# Patient Record
Sex: Female | Born: 1945 | Race: White | Hispanic: No | Marital: Single | Smoking: Former smoker
Health system: Southern US, Community
[De-identification: ages and names within clinical notes are randomized; demographics above are authoritative.]

## PROBLEM LIST (undated history)

## (undated) DIAGNOSIS — R0600 Dyspnea, unspecified: Secondary | ICD-10-CM

## (undated) DIAGNOSIS — H409 Unspecified glaucoma: Secondary | ICD-10-CM

## (undated) DIAGNOSIS — T8859XA Other complications of anesthesia, initial encounter: Secondary | ICD-10-CM

## (undated) DIAGNOSIS — N183 Chronic kidney disease, stage 3 unspecified: Secondary | ICD-10-CM

## (undated) DIAGNOSIS — T4145XA Adverse effect of unspecified anesthetic, initial encounter: Secondary | ICD-10-CM

## (undated) DIAGNOSIS — F32A Depression, unspecified: Secondary | ICD-10-CM

## (undated) DIAGNOSIS — I272 Pulmonary hypertension, unspecified: Secondary | ICD-10-CM

## (undated) DIAGNOSIS — K5900 Constipation, unspecified: Secondary | ICD-10-CM

## (undated) DIAGNOSIS — M1990 Unspecified osteoarthritis, unspecified site: Secondary | ICD-10-CM

## (undated) DIAGNOSIS — E039 Hypothyroidism, unspecified: Secondary | ICD-10-CM

## (undated) DIAGNOSIS — F329 Major depressive disorder, single episode, unspecified: Secondary | ICD-10-CM

## (undated) DIAGNOSIS — I059 Rheumatic mitral valve disease, unspecified: Secondary | ICD-10-CM

## (undated) DIAGNOSIS — K219 Gastro-esophageal reflux disease without esophagitis: Secondary | ICD-10-CM

## (undated) DIAGNOSIS — I350 Nonrheumatic aortic (valve) stenosis: Secondary | ICD-10-CM

## (undated) DIAGNOSIS — I10 Essential (primary) hypertension: Secondary | ICD-10-CM

## (undated) DIAGNOSIS — E785 Hyperlipidemia, unspecified: Secondary | ICD-10-CM

## (undated) HISTORY — PX: COLONOSCOPY: SHX174

## (undated) HISTORY — DX: Hyperlipidemia, unspecified: E78.5

## (undated) HISTORY — DX: Gastro-esophageal reflux disease without esophagitis: K21.9

## (undated) HISTORY — PX: CARPAL TUNNEL RELEASE: SHX101

## (undated) HISTORY — DX: Depression, unspecified: F32.A

## (undated) HISTORY — DX: Pulmonary hypertension, unspecified: I27.20

## (undated) HISTORY — DX: Chronic kidney disease, stage 3 unspecified: N18.30

## (undated) HISTORY — DX: Chronic kidney disease, stage 3 (moderate): N18.3

## (undated) HISTORY — PX: TOE SURGERY: SHX1073

## (undated) HISTORY — DX: Essential (primary) hypertension: I10

## (undated) HISTORY — DX: Major depressive disorder, single episode, unspecified: F32.9

## (undated) HISTORY — DX: Unspecified glaucoma: H40.9

## (undated) HISTORY — DX: Nonrheumatic aortic (valve) stenosis: I35.0

## (undated) HISTORY — DX: Rheumatic mitral valve disease, unspecified: I05.9

## (undated) HISTORY — DX: Hypothyroidism, unspecified: E03.9

## (undated) HISTORY — PX: CATARACT EXTRACTION: SUR2

## (undated) HISTORY — PX: BUNIONECTOMY: SHX129

## (undated) HISTORY — PX: TRIGGER FINGER RELEASE: SHX641

---

## 1980-12-17 HISTORY — PX: CARPAL TUNNEL RELEASE: SHX101

## 2005-04-09 ENCOUNTER — Other Ambulatory Visit: Admission: RE | Admit: 2005-04-09 | Discharge: 2005-04-09 | Payer: Self-pay | Admitting: Family Medicine

## 2005-12-04 ENCOUNTER — Encounter: Admission: RE | Admit: 2005-12-04 | Discharge: 2005-12-04 | Payer: Self-pay | Admitting: Internal Medicine

## 2006-02-07 ENCOUNTER — Ambulatory Visit (HOSPITAL_COMMUNITY): Admission: RE | Admit: 2006-02-07 | Discharge: 2006-02-07 | Payer: Self-pay | Admitting: Cardiology

## 2006-02-19 ENCOUNTER — Ambulatory Visit (HOSPITAL_COMMUNITY): Admission: RE | Admit: 2006-02-19 | Discharge: 2006-02-19 | Payer: Self-pay | Admitting: Cardiology

## 2006-12-20 ENCOUNTER — Other Ambulatory Visit: Admission: RE | Admit: 2006-12-20 | Discharge: 2006-12-20 | Payer: Self-pay | Admitting: Obstetrics and Gynecology

## 2006-12-23 ENCOUNTER — Encounter: Admission: RE | Admit: 2006-12-23 | Discharge: 2006-12-23 | Payer: Self-pay | Admitting: Obstetrics and Gynecology

## 2006-12-25 ENCOUNTER — Encounter: Admission: RE | Admit: 2006-12-25 | Discharge: 2006-12-25 | Payer: Self-pay | Admitting: Obstetrics and Gynecology

## 2007-07-01 ENCOUNTER — Encounter: Admission: RE | Admit: 2007-07-01 | Discharge: 2007-07-01 | Payer: Self-pay | Admitting: Obstetrics and Gynecology

## 2007-09-29 ENCOUNTER — Emergency Department (HOSPITAL_COMMUNITY): Admission: EM | Admit: 2007-09-29 | Discharge: 2007-09-29 | Payer: Self-pay | Admitting: Emergency Medicine

## 2008-01-23 ENCOUNTER — Encounter: Admission: RE | Admit: 2008-01-23 | Discharge: 2008-01-23 | Payer: Self-pay | Admitting: Obstetrics and Gynecology

## 2008-05-03 ENCOUNTER — Other Ambulatory Visit: Admission: RE | Admit: 2008-05-03 | Discharge: 2008-05-03 | Payer: Self-pay | Admitting: Obstetrics and Gynecology

## 2008-05-20 ENCOUNTER — Encounter: Admission: RE | Admit: 2008-05-20 | Discharge: 2008-05-20 | Payer: Self-pay | Admitting: Cardiology

## 2008-05-27 ENCOUNTER — Ambulatory Visit (HOSPITAL_COMMUNITY): Admission: RE | Admit: 2008-05-27 | Discharge: 2008-05-27 | Payer: Self-pay | Admitting: Cardiology

## 2008-06-09 ENCOUNTER — Inpatient Hospital Stay (HOSPITAL_BASED_OUTPATIENT_CLINIC_OR_DEPARTMENT_OTHER): Admission: RE | Admit: 2008-06-09 | Discharge: 2008-06-09 | Payer: Self-pay | Admitting: Cardiology

## 2009-06-07 ENCOUNTER — Other Ambulatory Visit: Admission: RE | Admit: 2009-06-07 | Discharge: 2009-06-07 | Payer: Self-pay | Admitting: Obstetrics and Gynecology

## 2009-08-09 ENCOUNTER — Encounter: Admission: RE | Admit: 2009-08-09 | Discharge: 2009-08-09 | Payer: Self-pay | Admitting: Obstetrics and Gynecology

## 2009-11-04 ENCOUNTER — Inpatient Hospital Stay (HOSPITAL_BASED_OUTPATIENT_CLINIC_OR_DEPARTMENT_OTHER): Admission: RE | Admit: 2009-11-04 | Discharge: 2009-11-04 | Payer: Self-pay | Admitting: Cardiology

## 2009-11-04 HISTORY — PX: CARDIAC CATHETERIZATION: SHX172

## 2010-07-04 ENCOUNTER — Other Ambulatory Visit: Admission: RE | Admit: 2010-07-04 | Discharge: 2010-07-04 | Payer: Self-pay | Admitting: Obstetrics and Gynecology

## 2011-01-07 ENCOUNTER — Encounter: Payer: Self-pay | Admitting: Obstetrics and Gynecology

## 2011-05-01 NOTE — Cardiovascular Report (Signed)
NAME:  Maureen Keller, Maureen Keller                ACCOUNT NO.:  [id number]   MEDICAL RECORD NO.:  [id number]          PATIENT TYPE:  OIB   LOCATION:  1961                         FACILITY:  [facility]   PHYSICIAN:  Armanda Magic, M.D.     DATE OF BIRTH:  1946/08/06   DATE OF PROCEDURE:  DATE OF DISCHARGE:  06/09/2008                            CARDIAC CATHETERIZATION   REFERRING PHYSICIAN:  Dr. Lavonda Jumbo, M.D.   PROCEDURES:  1. Right and left heart catheterization.  2. Coronary angiography.  3. Left ventriculography.   OPERATOR:  Trace Turner, MD.   INDICATIONS:  Pulmonary hypertension by 2-D echocardiogram with mild  aortic stenosis by 2-D echocardiogram as well.  The patient has been  having shortness of breath and now presents for heart catheterization.   The patient was brought to the Cardiac Catheterization Laboratory in a  fasting nonsedated state.  Informed consent was obtained.  The patient  was connected to continuous heart rate and pulse oximetry monitoring and  blood pressure monitoring.  The right groin was prepped and draped in  sterile fashion.  1% Xylocaine was used for local anesthesia.  Using a  modified Seldinger technique, a 4-French sheath was attempted to be  placed in the right femoral artery, but there was difficulty in  inserting the sheath.  The sheath was taken out.  The dilator was placed  over the wire to dilate up a tract which went very smoothly but then  when the dilator was reattached to the sheath, the sheath could not be  advanced over the wire into the femoral artery.  The sheath was  exchanged out for a 5-French sheath and the same thing occurred.  The  sheath was removed, and a 5-French dilator was placed over the wire  again trying to dilate a tract through the skin into the artery.  The  sheath was then reattached to the dilator and placed back over the wire  again with difficulty in advancing the sheath.  The sheath was removed  and attempts at a 4-French  sheath again were successful.  Then using a  modified Seldinger technique, a 7-French sheath was placed in the right  femoral vein.  Under fluoroscopic guidance, a Swan-Ganz catheter was  placed under balloon flotation into the right atrium.  Right atrial  pressure was measured.  Right atrial O2 saturations were obtained.  Aortic O2 saturations were also obtained.  The catheter was then  advanced into the right ventricle, and right ventricular pressure was  measured.  The catheter was then advanced into the pulmonary artery into  the wedge position.  Pulmonary capillary wedge pressure was measured,  and balloon was deflated and pulled back into the pulmonary artery.  Pulmonary artery O2 saturations were obtained.  Thermal dilutions were  then obtained using 10 mL of contrast for injections.  The Swan-Ganz  catheter then was removed under fluoroscopic guidance.   Under fluoroscopic guidance, a 4-French JL4 catheter was placed under  fluoroscopic guidance in the left coronary artery.  Multiple cine films  were taken at 30 degree RAO, 40 degree LAO  views.  This catheter was  then exchanged out over a guidewire for a 4-French 3-D RC catheter which  was placed under fluoroscopic guidance in the right coronary artery.  Multiple cine films taken at 30 degree RAO, 40 degree LAO views.  This  catheter was then exchanged out over a guidewire for a 6-French angled  pigtail catheter was placed under fluoroscopic guidance in the left  ventricular cavity.  Left ventriculography was performed in the 30  degree RAO view using a total of 30 mL of contrast at 15 mL per second.  The catheter was then pulled back across the aortic valve with no  significant gradient noted.  At the end of the procedure, all catheters  and sheaths were removed.  Manual compression was performed, so adequate  hemostasis was obtained.  The patient was transferred back to room in  stable condition.   RESULTS:  1. The right  heart cath data:  Right atrial O2 saturation 69%,      pulmonary artery O2 saturation 67%, aortic O2 saturation 90%,      cardiac output by Fick was 4.8 and by thermal dilution 3.1, cardiac      index by Fick 2.6 and cardiac index by thermal dilution was 1.7,      right atrial pressure 10/8 with a mean of 6 mmHg, RV pressure of      39/3 with a mean of 14 mmHg, PA pressure 38/13 with a mean of 23      mmHg, pulmonary capillary wedge 16/15 with a mean of 12 mmHg, left      ventricular pressure 133/7 with a mean of 23 mmHg, LVEDP was 23 to      28 mmHg, and aortic pressure 122/47 mmHg.   Left ventriculography showed normal LV function, EF of 60%, and the peak  aortic valve gradient was 10 mmHg.   Left main coronary artery is widely patent and bifurcates to left  anterior descending artery and left circumflex artery both of which are  widely patent.  The left anterior descending artery is widely patent  throughout its course, the apex giving rise to diagonal branches both of  which are widely patent.   Left circumflex is widely patent throughout its course in the AV groove  giving rise to a first obtuse marginal branch which is widely patent and  then terminates in the second obtuse marginal branch which is widely  patent.   The right coronary artery is widely patent throughout its course and  distally bifurcates into posterior descending artery and posterior  lateral artery both of which are widely patent.   ASSESSMENT:  1. Normal coronary arteries.  2. Normal left ventricular function.  3. No significant aortic stenosis.  4. Normal right heart cath pressures with no evidence of pulmonary      hypertension.  5. Elevated LVEDP consistent with diastolic dysfunction which could be      the etiology of the patient's shortness of breath.   PLAN:  We will continue Altace for blood pressure control as well as for  her aortic insufficiency, and we will add Toprol-XL 25 mg daily for   diastolic dysfunction, and she will follow up with my nurse practitioner  in 2 weeks for groin check.      Armanda Magic, M.D.  Electronically Signed     TT/MEDQ  D:  06/09/2008  T:  06/10/2008  Job:  [id number]   cc:   Lavonda Jumbo, M.D.

## 2011-05-04 NOTE — Cardiovascular Report (Signed)
Maureen Keller, Maureen Keller                ACCOUNT NO.:  [id number]   MEDICAL RECORD NO.:  [id number]          PATIENT TYPE:  OIB   LOCATION:  2852                         FACILITY:  [facility]   PHYSICIAN:  Armanda Magic, M.D.     DATE OF BIRTH:  1945/12/26   DATE OF PROCEDURE:  02/07/2006  DATE OF DISCHARGE:  02/07/2006                              CARDIAC CATHETERIZATION   REFERRING PHYSICIAN:  Dr. Joselyn Arrow.   PROCEDURES:  Left heart catheterization, coronary angiography, left  ventriculography, intracoronary nitroglycerin infusion.   OPERATOR:  Armanda Magic, M.D.   INDICATIONS:  Shortness of breath.   COMPLICATIONS:  None.   IV ACCESS:  Via right femoral artery 6-French sheath.   This is a very pleasant 65 year old morbidly obese white female, with a  history of poorly controlled hypertension and dyspnea on exertion, who  presents with slight decreased uptake in the anterior wall and now presents  for cardiac catheterization.   The patient is brought to cardiac catheterization laboratory in the fasting  nonsedated state. Informed consent was obtained. The patient was connected  to continuous heart rate and continuous pulse oximetry monitoring and  intermittent blood pressure monitoring. The right groin was prepped and  draped in a sterile fashion. 1% Xylocaine was used for local anesthesia.  Using modified Seldinger technique, a 6-French sheath was placed in the  right femoral artery. Under fluoroscopic guidance, a 6-French JL-4 catheter  was placed in the left coronary artery. Multiple cine films were taken at 30  degree RAO and 40 degree LAO views. The catheter was then exchanged out over  guidewire for a 6-French JR-4 catheter which was placed under fluoroscopic  guidance in the right coronary artery. Multiple cine films taken at 30  degree RAO and 40 degree LAO views. 200 mcg of intracoronary nitroglycerin  was then administered and repeat multiple cine films in the 30 degree  RAO  and 40 degree LAO views were performed. Catheter was then exchanged out over  guidewire for 6-French angled pigtail catheter which was placed under  fluoroscopic guidance in the left ventricular cavity. Left ventriculography  was performed in the 30 degree RAO view using a total 30 cc of contrast at  15 cc per second. Catheter was pulled back across the aortic valve with a  minimal gradient across the aortic valve. At the end of the procedure, all  catheters and sheaths were removed. Manual compression was performed until  adequate hemostasis was obtained. The patient was transferred back to the  room in stable condition.   RESULTS:  1.  The left main coronary artery is widely patent and bifurcates in the      left anterior descending artery and left circumflex artery.  2.  Left anterior descending artery is widely patent throughout its course      to the apex giving rise to two diagonal branches both of which are      widely patent.  3.  The left circumflex is widely patent throughout its course to the AV      groove giving rise to a first obtuse  marginal branch which is widely      patent and a second obtuse marginal branch which is widely patent.  4.  The right coronary artery is widely patent throughout its course and      bifurcates distally to the posterior descending artery and posterior      lateral artery.   Left ventriculography shows normal LV systolic function. LV pressure was  155/7. Aortic pressure 138/65. The peak aortic valve gradient was 17 mmHg.   ASSESSMENT:  1.  Shortness of breath secondary poorly controlled blood pressure.  2.  Normal coronary arteries.  3.  Normal left ventricular function.  4.  Mild aortic stenosis.  5.  Hypertension.   PLAN:  Discharge home after IV fluid and bedrest. Continue medications.  Follow-up with me in two weeks.      Armanda Magic, M.D.  Electronically Signed     TT/MEDQ  D:  02/07/2006  T:  02/08/2006  Job:   [id number]   cc:   Lavonda Jumbo, M.D.  Fax: [phone]

## 2011-08-16 ENCOUNTER — Other Ambulatory Visit (HOSPITAL_COMMUNITY)
Admission: RE | Admit: 2011-08-16 | Discharge: 2011-08-16 | Disposition: A | Discharge status: Home / Self Care | Payer: Medicare Other | Source: Ambulatory Visit | Attending: Obstetrics and Gynecology | Admitting: Obstetrics and Gynecology

## 2011-08-16 ENCOUNTER — Other Ambulatory Visit: Payer: Self-pay | Admitting: Obstetrics and Gynecology

## 2011-08-16 DIAGNOSIS — Z124 Encounter for screening for malignant neoplasm of cervix: Secondary | ICD-10-CM | POA: Insufficient documentation

## 2011-08-23 ENCOUNTER — Other Ambulatory Visit: Payer: Self-pay | Admitting: Obstetrics and Gynecology

## 2011-08-23 DIAGNOSIS — Z1231 Encounter for screening mammogram for malignant neoplasm of breast: Secondary | ICD-10-CM

## 2011-09-13 LAB — POCT I-STAT 3, VENOUS BLOOD GAS (G3P V)
Bicarbonate: 27.9 — ABNORMAL HIGH
Operator id: 141321
pCO2, Ven: 48.2
pCO2, Ven: 49.2
pH, Ven: 7.344 — ABNORMAL HIGH
pH, Ven: 7.361 — ABNORMAL HIGH
pO2, Ven: 37

## 2011-09-13 LAB — POCT I-STAT 3, ART BLOOD GAS (G3+)
O2 Saturation: 90
Operator id: 194801

## 2011-10-04 ENCOUNTER — Ambulatory Visit
Admission: RE | Admit: 2011-10-04 | Discharge: 2011-10-04 | Disposition: A | Discharge status: Home / Self Care | Payer: Medicare Other | Source: Ambulatory Visit | Attending: Obstetrics and Gynecology | Admitting: Obstetrics and Gynecology

## 2011-10-04 DIAGNOSIS — Z1231 Encounter for screening mammogram for malignant neoplasm of breast: Secondary | ICD-10-CM

## 2012-01-24 DIAGNOSIS — F438 Other reactions to severe stress: Secondary | ICD-10-CM | POA: Diagnosis not present

## 2012-01-24 DIAGNOSIS — F322 Major depressive disorder, single episode, severe without psychotic features: Secondary | ICD-10-CM | POA: Diagnosis not present

## 2012-01-31 DIAGNOSIS — F322 Major depressive disorder, single episode, severe without psychotic features: Secondary | ICD-10-CM | POA: Diagnosis not present

## 2012-01-31 DIAGNOSIS — F438 Other reactions to severe stress: Secondary | ICD-10-CM | POA: Diagnosis not present

## 2012-02-07 DIAGNOSIS — F322 Major depressive disorder, single episode, severe without psychotic features: Secondary | ICD-10-CM | POA: Diagnosis not present

## 2012-02-07 DIAGNOSIS — F438 Other reactions to severe stress: Secondary | ICD-10-CM | POA: Diagnosis not present

## 2012-02-14 DIAGNOSIS — F322 Major depressive disorder, single episode, severe without psychotic features: Secondary | ICD-10-CM | POA: Diagnosis not present

## 2012-02-14 DIAGNOSIS — F438 Other reactions to severe stress: Secondary | ICD-10-CM | POA: Diagnosis not present

## 2012-02-21 DIAGNOSIS — F438 Other reactions to severe stress: Secondary | ICD-10-CM | POA: Diagnosis not present

## 2012-02-21 DIAGNOSIS — F322 Major depressive disorder, single episode, severe without psychotic features: Secondary | ICD-10-CM | POA: Diagnosis not present

## 2012-03-06 DIAGNOSIS — F322 Major depressive disorder, single episode, severe without psychotic features: Secondary | ICD-10-CM | POA: Diagnosis not present

## 2012-03-06 DIAGNOSIS — F438 Other reactions to severe stress: Secondary | ICD-10-CM | POA: Diagnosis not present

## 2012-03-20 DIAGNOSIS — M25569 Pain in unspecified knee: Secondary | ICD-10-CM | POA: Diagnosis not present

## 2012-03-31 DIAGNOSIS — M25569 Pain in unspecified knee: Secondary | ICD-10-CM | POA: Diagnosis not present

## 2012-04-03 DIAGNOSIS — M1710 Unilateral primary osteoarthritis, unspecified knee: Secondary | ICD-10-CM | POA: Diagnosis not present

## 2012-04-07 DIAGNOSIS — M25569 Pain in unspecified knee: Secondary | ICD-10-CM | POA: Diagnosis not present

## 2012-04-28 DIAGNOSIS — M25569 Pain in unspecified knee: Secondary | ICD-10-CM | POA: Diagnosis not present

## 2012-05-15 DIAGNOSIS — I359 Nonrheumatic aortic valve disorder, unspecified: Secondary | ICD-10-CM | POA: Diagnosis not present

## 2012-05-15 DIAGNOSIS — R05 Cough: Secondary | ICD-10-CM | POA: Diagnosis not present

## 2012-05-15 DIAGNOSIS — R0602 Shortness of breath: Secondary | ICD-10-CM | POA: Diagnosis not present

## 2012-05-15 DIAGNOSIS — M25569 Pain in unspecified knee: Secondary | ICD-10-CM | POA: Diagnosis not present

## 2012-05-15 DIAGNOSIS — I5032 Chronic diastolic (congestive) heart failure: Secondary | ICD-10-CM | POA: Diagnosis not present

## 2012-05-15 DIAGNOSIS — I119 Hypertensive heart disease without heart failure: Secondary | ICD-10-CM | POA: Diagnosis not present

## 2012-05-22 DIAGNOSIS — I119 Hypertensive heart disease without heart failure: Secondary | ICD-10-CM | POA: Diagnosis not present

## 2012-05-22 DIAGNOSIS — I359 Nonrheumatic aortic valve disorder, unspecified: Secondary | ICD-10-CM | POA: Diagnosis not present

## 2012-05-22 DIAGNOSIS — R0602 Shortness of breath: Secondary | ICD-10-CM | POA: Diagnosis not present

## 2012-05-22 DIAGNOSIS — I5032 Chronic diastolic (congestive) heart failure: Secondary | ICD-10-CM | POA: Diagnosis not present

## 2012-07-01 DIAGNOSIS — R05 Cough: Secondary | ICD-10-CM | POA: Diagnosis not present

## 2012-07-29 DIAGNOSIS — R05 Cough: Secondary | ICD-10-CM | POA: Diagnosis not present

## 2012-08-21 DIAGNOSIS — N952 Postmenopausal atrophic vaginitis: Secondary | ICD-10-CM | POA: Diagnosis not present

## 2012-08-21 DIAGNOSIS — Q519 Congenital malformation of uterus and cervix, unspecified: Secondary | ICD-10-CM | POA: Diagnosis not present

## 2012-08-26 DIAGNOSIS — L708 Other acne: Secondary | ICD-10-CM | POA: Diagnosis not present

## 2012-08-26 DIAGNOSIS — L821 Other seborrheic keratosis: Secondary | ICD-10-CM | POA: Diagnosis not present

## 2012-09-22 DIAGNOSIS — H409 Unspecified glaucoma: Secondary | ICD-10-CM | POA: Diagnosis not present

## 2012-09-22 DIAGNOSIS — H4011X0 Primary open-angle glaucoma, stage unspecified: Secondary | ICD-10-CM | POA: Diagnosis not present

## 2012-10-16 DIAGNOSIS — Z0000 Encounter for general adult medical examination without abnormal findings: Secondary | ICD-10-CM | POA: Diagnosis not present

## 2012-10-16 DIAGNOSIS — I10 Essential (primary) hypertension: Secondary | ICD-10-CM | POA: Diagnosis not present

## 2012-10-16 DIAGNOSIS — E782 Mixed hyperlipidemia: Secondary | ICD-10-CM | POA: Diagnosis not present

## 2012-10-16 DIAGNOSIS — Z23 Encounter for immunization: Secondary | ICD-10-CM | POA: Diagnosis not present

## 2012-10-16 DIAGNOSIS — E039 Hypothyroidism, unspecified: Secondary | ICD-10-CM | POA: Diagnosis not present

## 2012-10-16 DIAGNOSIS — E669 Obesity, unspecified: Secondary | ICD-10-CM | POA: Diagnosis not present

## 2012-11-17 DIAGNOSIS — H4011X0 Primary open-angle glaucoma, stage unspecified: Secondary | ICD-10-CM | POA: Diagnosis not present

## 2012-11-17 DIAGNOSIS — H409 Unspecified glaucoma: Secondary | ICD-10-CM | POA: Diagnosis not present

## 2012-12-29 DIAGNOSIS — H26499 Other secondary cataract, unspecified eye: Secondary | ICD-10-CM | POA: Diagnosis not present

## 2013-01-08 DIAGNOSIS — H26499 Other secondary cataract, unspecified eye: Secondary | ICD-10-CM | POA: Diagnosis not present

## 2013-04-09 DIAGNOSIS — E039 Hypothyroidism, unspecified: Secondary | ICD-10-CM | POA: Diagnosis not present

## 2013-04-09 DIAGNOSIS — I10 Essential (primary) hypertension: Secondary | ICD-10-CM | POA: Diagnosis not present

## 2013-04-09 DIAGNOSIS — E663 Overweight: Secondary | ICD-10-CM | POA: Diagnosis not present

## 2013-05-19 DIAGNOSIS — I359 Nonrheumatic aortic valve disorder, unspecified: Secondary | ICD-10-CM | POA: Diagnosis not present

## 2013-06-05 DIAGNOSIS — F330 Major depressive disorder, recurrent, mild: Secondary | ICD-10-CM | POA: Diagnosis not present

## 2013-06-05 DIAGNOSIS — F4310 Post-traumatic stress disorder, unspecified: Secondary | ICD-10-CM | POA: Diagnosis not present

## 2013-06-24 DIAGNOSIS — F330 Major depressive disorder, recurrent, mild: Secondary | ICD-10-CM | POA: Diagnosis not present

## 2013-06-24 DIAGNOSIS — F4310 Post-traumatic stress disorder, unspecified: Secondary | ICD-10-CM | POA: Diagnosis not present

## 2013-07-21 DIAGNOSIS — F330 Major depressive disorder, recurrent, mild: Secondary | ICD-10-CM | POA: Diagnosis not present

## 2013-07-21 DIAGNOSIS — F4310 Post-traumatic stress disorder, unspecified: Secondary | ICD-10-CM | POA: Diagnosis not present

## 2013-07-22 DIAGNOSIS — F330 Major depressive disorder, recurrent, mild: Secondary | ICD-10-CM | POA: Diagnosis not present

## 2013-07-22 DIAGNOSIS — F4310 Post-traumatic stress disorder, unspecified: Secondary | ICD-10-CM | POA: Diagnosis not present

## 2013-07-27 ENCOUNTER — Other Ambulatory Visit: Payer: Self-pay

## 2013-07-27 DIAGNOSIS — Z1231 Encounter for screening mammogram for malignant neoplasm of breast: Secondary | ICD-10-CM

## 2013-07-29 DIAGNOSIS — F4310 Post-traumatic stress disorder, unspecified: Secondary | ICD-10-CM | POA: Diagnosis not present

## 2013-07-29 DIAGNOSIS — F330 Major depressive disorder, recurrent, mild: Secondary | ICD-10-CM | POA: Diagnosis not present

## 2013-08-05 DIAGNOSIS — F330 Major depressive disorder, recurrent, mild: Secondary | ICD-10-CM | POA: Diagnosis not present

## 2013-08-05 DIAGNOSIS — F4310 Post-traumatic stress disorder, unspecified: Secondary | ICD-10-CM | POA: Diagnosis not present

## 2013-08-12 DIAGNOSIS — F4310 Post-traumatic stress disorder, unspecified: Secondary | ICD-10-CM | POA: Diagnosis not present

## 2013-08-12 DIAGNOSIS — F330 Major depressive disorder, recurrent, mild: Secondary | ICD-10-CM | POA: Diagnosis not present

## 2013-08-14 ENCOUNTER — Ambulatory Visit
Admission: RE | Admit: 2013-08-14 | Discharge: 2013-08-14 | Disposition: A | Discharge status: Home / Self Care | Payer: Medicare Other | Source: Ambulatory Visit

## 2013-08-14 DIAGNOSIS — Z1231 Encounter for screening mammogram for malignant neoplasm of breast: Secondary | ICD-10-CM | POA: Diagnosis not present

## 2013-08-19 DIAGNOSIS — F330 Major depressive disorder, recurrent, mild: Secondary | ICD-10-CM | POA: Diagnosis not present

## 2013-08-19 DIAGNOSIS — F4310 Post-traumatic stress disorder, unspecified: Secondary | ICD-10-CM | POA: Diagnosis not present

## 2013-08-26 DIAGNOSIS — F4310 Post-traumatic stress disorder, unspecified: Secondary | ICD-10-CM | POA: Diagnosis not present

## 2013-08-26 DIAGNOSIS — F330 Major depressive disorder, recurrent, mild: Secondary | ICD-10-CM | POA: Diagnosis not present

## 2013-08-31 DIAGNOSIS — R269 Unspecified abnormalities of gait and mobility: Secondary | ICD-10-CM | POA: Diagnosis not present

## 2013-08-31 DIAGNOSIS — Z9181 History of falling: Secondary | ICD-10-CM | POA: Diagnosis not present

## 2013-08-31 DIAGNOSIS — F329 Major depressive disorder, single episode, unspecified: Secondary | ICD-10-CM | POA: Diagnosis not present

## 2013-09-01 ENCOUNTER — Other Ambulatory Visit: Payer: Self-pay | Admitting: Obstetrics and Gynecology

## 2013-09-01 ENCOUNTER — Other Ambulatory Visit (HOSPITAL_COMMUNITY)
Admission: RE | Admit: 2013-09-01 | Discharge: 2013-09-01 | Disposition: A | Discharge status: Home / Self Care | Payer: Medicare Other | Source: Ambulatory Visit | Attending: Obstetrics and Gynecology | Admitting: Obstetrics and Gynecology

## 2013-09-01 DIAGNOSIS — Z1151 Encounter for screening for human papillomavirus (HPV): Secondary | ICD-10-CM | POA: Insufficient documentation

## 2013-09-01 DIAGNOSIS — Z01419 Encounter for gynecological examination (general) (routine) without abnormal findings: Secondary | ICD-10-CM | POA: Insufficient documentation

## 2013-09-02 DIAGNOSIS — F4310 Post-traumatic stress disorder, unspecified: Secondary | ICD-10-CM | POA: Diagnosis not present

## 2013-09-02 DIAGNOSIS — F330 Major depressive disorder, recurrent, mild: Secondary | ICD-10-CM | POA: Diagnosis not present

## 2013-09-03 DIAGNOSIS — R269 Unspecified abnormalities of gait and mobility: Secondary | ICD-10-CM | POA: Diagnosis not present

## 2013-09-08 DIAGNOSIS — R269 Unspecified abnormalities of gait and mobility: Secondary | ICD-10-CM | POA: Diagnosis not present

## 2013-09-09 DIAGNOSIS — F330 Major depressive disorder, recurrent, mild: Secondary | ICD-10-CM | POA: Diagnosis not present

## 2013-09-09 DIAGNOSIS — F4310 Post-traumatic stress disorder, unspecified: Secondary | ICD-10-CM | POA: Diagnosis not present

## 2013-09-11 DIAGNOSIS — R269 Unspecified abnormalities of gait and mobility: Secondary | ICD-10-CM | POA: Diagnosis not present

## 2013-09-14 DIAGNOSIS — E039 Hypothyroidism, unspecified: Secondary | ICD-10-CM | POA: Diagnosis not present

## 2013-09-16 DIAGNOSIS — F330 Major depressive disorder, recurrent, mild: Secondary | ICD-10-CM | POA: Diagnosis not present

## 2013-09-16 DIAGNOSIS — F4310 Post-traumatic stress disorder, unspecified: Secondary | ICD-10-CM | POA: Diagnosis not present

## 2013-09-17 DIAGNOSIS — R269 Unspecified abnormalities of gait and mobility: Secondary | ICD-10-CM | POA: Diagnosis not present

## 2013-09-22 DIAGNOSIS — IMO0002 Reserved for concepts with insufficient information to code with codable children: Secondary | ICD-10-CM | POA: Diagnosis not present

## 2013-09-22 DIAGNOSIS — R269 Unspecified abnormalities of gait and mobility: Secondary | ICD-10-CM | POA: Diagnosis not present

## 2013-09-23 DIAGNOSIS — F330 Major depressive disorder, recurrent, mild: Secondary | ICD-10-CM | POA: Diagnosis not present

## 2013-09-23 DIAGNOSIS — F4310 Post-traumatic stress disorder, unspecified: Secondary | ICD-10-CM | POA: Diagnosis not present

## 2013-09-24 DIAGNOSIS — R269 Unspecified abnormalities of gait and mobility: Secondary | ICD-10-CM | POA: Diagnosis not present

## 2013-09-28 DIAGNOSIS — R269 Unspecified abnormalities of gait and mobility: Secondary | ICD-10-CM | POA: Diagnosis not present

## 2013-09-29 DIAGNOSIS — R269 Unspecified abnormalities of gait and mobility: Secondary | ICD-10-CM | POA: Diagnosis not present

## 2013-10-06 DIAGNOSIS — R269 Unspecified abnormalities of gait and mobility: Secondary | ICD-10-CM | POA: Diagnosis not present

## 2013-10-06 DIAGNOSIS — E039 Hypothyroidism, unspecified: Secondary | ICD-10-CM | POA: Diagnosis not present

## 2013-10-07 DIAGNOSIS — F4310 Post-traumatic stress disorder, unspecified: Secondary | ICD-10-CM | POA: Diagnosis not present

## 2013-10-07 DIAGNOSIS — F330 Major depressive disorder, recurrent, mild: Secondary | ICD-10-CM | POA: Diagnosis not present

## 2013-10-09 DIAGNOSIS — R269 Unspecified abnormalities of gait and mobility: Secondary | ICD-10-CM | POA: Diagnosis not present

## 2013-10-13 DIAGNOSIS — R269 Unspecified abnormalities of gait and mobility: Secondary | ICD-10-CM | POA: Diagnosis not present

## 2013-10-14 DIAGNOSIS — F330 Major depressive disorder, recurrent, mild: Secondary | ICD-10-CM | POA: Diagnosis not present

## 2013-10-14 DIAGNOSIS — F4310 Post-traumatic stress disorder, unspecified: Secondary | ICD-10-CM | POA: Diagnosis not present

## 2013-10-15 DIAGNOSIS — R269 Unspecified abnormalities of gait and mobility: Secondary | ICD-10-CM | POA: Diagnosis not present

## 2013-10-16 DIAGNOSIS — I129 Hypertensive chronic kidney disease with stage 1 through stage 4 chronic kidney disease, or unspecified chronic kidney disease: Secondary | ICD-10-CM | POA: Diagnosis not present

## 2013-10-16 DIAGNOSIS — N058 Unspecified nephritic syndrome with other morphologic changes: Secondary | ICD-10-CM | POA: Diagnosis not present

## 2013-10-20 DIAGNOSIS — R269 Unspecified abnormalities of gait and mobility: Secondary | ICD-10-CM | POA: Diagnosis not present

## 2013-10-21 DIAGNOSIS — F330 Major depressive disorder, recurrent, mild: Secondary | ICD-10-CM | POA: Diagnosis not present

## 2013-10-21 DIAGNOSIS — F4310 Post-traumatic stress disorder, unspecified: Secondary | ICD-10-CM | POA: Diagnosis not present

## 2013-10-22 DIAGNOSIS — I359 Nonrheumatic aortic valve disorder, unspecified: Secondary | ICD-10-CM | POA: Diagnosis not present

## 2013-10-22 DIAGNOSIS — E785 Hyperlipidemia, unspecified: Secondary | ICD-10-CM | POA: Diagnosis not present

## 2013-10-22 DIAGNOSIS — R269 Unspecified abnormalities of gait and mobility: Secondary | ICD-10-CM | POA: Diagnosis not present

## 2013-10-22 DIAGNOSIS — Z23 Encounter for immunization: Secondary | ICD-10-CM | POA: Diagnosis not present

## 2013-10-22 DIAGNOSIS — E039 Hypothyroidism, unspecified: Secondary | ICD-10-CM | POA: Diagnosis not present

## 2013-10-22 DIAGNOSIS — I10 Essential (primary) hypertension: Secondary | ICD-10-CM | POA: Diagnosis not present

## 2013-10-22 DIAGNOSIS — E663 Overweight: Secondary | ICD-10-CM | POA: Diagnosis not present

## 2013-10-27 DIAGNOSIS — R269 Unspecified abnormalities of gait and mobility: Secondary | ICD-10-CM | POA: Diagnosis not present

## 2013-10-28 DIAGNOSIS — F330 Major depressive disorder, recurrent, mild: Secondary | ICD-10-CM | POA: Diagnosis not present

## 2013-10-28 DIAGNOSIS — F4310 Post-traumatic stress disorder, unspecified: Secondary | ICD-10-CM | POA: Diagnosis not present

## 2013-10-29 ENCOUNTER — Ambulatory Visit (INDEPENDENT_AMBULATORY_CARE_PROVIDER_SITE_OTHER): Payer: Medicare Other | Admitting: Neurology

## 2013-10-29 ENCOUNTER — Encounter: Payer: Self-pay | Admitting: Neurology

## 2013-10-29 VITALS — BP 146/74 | HR 60 | Temp 97.4°F | Resp 14 | Ht 63.0 in | Wt 205.0 lb

## 2013-10-29 DIAGNOSIS — R296 Repeated falls: Secondary | ICD-10-CM

## 2013-10-29 DIAGNOSIS — G219 Secondary parkinsonism, unspecified: Secondary | ICD-10-CM | POA: Diagnosis not present

## 2013-10-29 DIAGNOSIS — E039 Hypothyroidism, unspecified: Secondary | ICD-10-CM | POA: Diagnosis not present

## 2013-10-29 DIAGNOSIS — R251 Tremor, unspecified: Secondary | ICD-10-CM

## 2013-10-29 DIAGNOSIS — Z9181 History of falling: Secondary | ICD-10-CM

## 2013-10-29 DIAGNOSIS — R259 Unspecified abnormal involuntary movements: Secondary | ICD-10-CM | POA: Diagnosis not present

## 2013-10-29 LAB — CBC WITH DIFFERENTIAL/PLATELET
Basophils Relative: 1 % (ref 0.0–3.0)
Eosinophils Relative: 2.8 % (ref 0.0–5.0)
HCT: 40.7 % (ref 36.0–46.0)
Hemoglobin: 14.2 g/dL (ref 12.0–15.0)
Lymphs Abs: 1.6 10*3/uL (ref 0.7–4.0)
MCV: 93.7 fl (ref 78.0–100.0)
Monocytes Absolute: 0.6 10*3/uL (ref 0.1–1.0)
Neutrophils Relative %: 51.8 % (ref 43.0–77.0)
RBC: 4.35 Mil/uL (ref 3.87–5.11)
WBC: 5 10*3/uL (ref 4.5–10.5)

## 2013-10-29 NOTE — Patient Instructions (Signed)
MRI is scheduled for Wednesday November 19th at 7:45am at [facility] entrance A first floor radiology. #[phone]

## 2013-10-29 NOTE — Progress Notes (Signed)
Maureen Keller was seen today in the movement disorders clinic for neurologic consultation at the request of Dr. Nehemiah Settle.  The consultation is for the evaluation of falls and to r/o PD.  The first symptom(s) the patient noticed was balance problems and this was 2 years ago.  The falls were previously just occasionally, but over the course of the last year, she has fallen 17 times.  The falls can be related to walking up stairs, trying to lean over to pick up her dog, trying to sit on the bed and missing it.  She states that she "doesn't pick up her feet" enough.  She has been in PT for 3 months and she doesn't think that it has helped the falls.  She has noted some tremor in the L hand when she holds the hand up and holds something in it.  She is R hand dominant but it involves the L hand and it has been going on 1-2 years.  She has rarely noted rest tremor.  Specific Symptoms:  Tremor: yes Voice: no change Sleep: sleeps well with trazodone  Vivid Dreams:  no  Acting out dreams:  no Wet Pillows: no Postural symptoms:  yes  Falls?  yes Bradykinesia symptoms: shuffling gait, difficulty getting out of a chair and difficulty regaining balance Loss of smell:  yes Loss of taste:  yes Urinary Incontinence:  yes (wears moderate thickness pad) Difficulty Swallowing:  no Handwriting, micrographia: yes Trouble with ADL's:  no (cannot stand up without leaning on something while putting on pants)  Trouble buttoning clothing: no Depression:  yes (not as upbeat as previous) Memory changes:  yes (word finding trouble, forgets in the middle of a sentence what the topic was) Hallucinations:  no  visual distortions: no N/V:  no Lightheaded:  no  Syncope: no Diplopia:  no Dyskinesia:  no  Neuroimaging has not previously been performed.    PREVIOUS MEDICATIONS: risperdal  ALLERGIES:  No Known Allergies  CURRENT MEDICATIONS:  No current outpatient prescriptions on file prior to visit.   No  current facility-administered medications on file prior to visit.    PAST MEDICAL HISTORY:   Past Medical History  Diagnosis Date  . Hypertension   . Mitral valve disorder   . Hypothyroidism   . Depression     PAST SURGICAL HISTORY:   Past Surgical History  Procedure Laterality Date  . Cataract surgery    . Cardiac catheterization      SOCIAL HISTORY:   History   Social History  . Marital Status: Single    Spouse Name: N/A    Number of Children: N/A  . Years of Education: N/A   Occupational History  . Not on file.   Social History Main Topics  . Smoking status: Former Games developer  . Smokeless tobacco: Never Used  . Alcohol Use: Yes     Comment: Occ  . Drug Use: Not on file  . Sexual Activity: Not on file   Other Topics Concern  . Not on file   Social History Narrative  . No narrative on file    FAMILY HISTORY:   Family Status  Relation Status Death Age  . Mother Deceased 46    Suicide (Pill Overdose)  . Father Deceased 39    Heart Disease  . Sister Alive   . Daughter Alive   . Son Alive     ROS:  A complete 10 system review of systems was obtained and was unremarkable  apart from what is mentioned above.  PHYSICAL EXAMINATION:    VITALS:   Filed Vitals:   10/29/13 1359  BP: 146/74  Pulse: 60  Temp: 97.4 F (36.3 C)  Resp: 14  Height: 5\' 3"  (1.6 m)  Weight: 205 lb (92.987 kg)    GEN:  The patient appears stated age and is in NAD. HEENT:  Normocephalic, atraumatic.  The mucous membranes are moist. The superficial temporal arteries are without ropiness or tenderness. CV:  RRR with 3/6 SEM radiating to the L carotid. Lungs:  CTAB Neck/HEME:  There are no carotid bruits bilaterally.  Neurological examination:  Orientation: The patient is alert and oriented x3. Fund of knowledge is appropriate.  Recent and remote memory are intact.  Attention and concentration are normal.    Able to name objects and repeat phrases. Cranial nerves: There is good  facial symmetry.  She does have mild asymmetric blink, with more blinking on the left than the right.  Pupils are equal round and reactive to light bilaterally. Fundoscopic exam reveals clear margins bilaterally. Extraocular muscles are intact. The visual fields are full to confrontational testing. The speech is fluent and clear. Soft palate rises symmetrically and there is no tongue deviation. Hearing is intact to conversational tone. Sensation: Sensation is intact to light and pinprick throughout (facial, trunk, extremities). Vibration is intact at the bilateral big toe. There is no extinction with double simultaneous stimulation. There is no sensory dermatomal level identified. Motor: Strength is 5/5 in the bilateral upper and lower extremities.   Shoulder shrug is equal and symmetric.  There is no pronator drift. Deep tendon reflexes: Deep tendon reflexes are 2/4 at the bilateral biceps, triceps, brachioradialis, patella and achilles. Plantar responses are downgoing bilaterally.  Movement examination: Tone: There is normal tone in the bilateral upper extremities.  The tone in the lower extremities is normal.  Abnormal movements: A slight rest tremor can be felt at rest on the R but did not note one on the L (the side the pt noted tremor on) Coordination:  There is decremation with RAM's, seen primarily on the L with finger taps and toe taps Gait and Station: The patient has no difficulty arising out of a deep-seated chair without the use of the hands. The patient's stride length is decreased and she turns en bloc.  The patient has a very positive pull test in that she virtually doesn't attempt to recover.  She has an exaggerated startle response as well.  ASSESSMENT/PLAN:  1.  Falls with mild parkinsonism  -I suspect that this is due to long-term Risperdal use.  The patient and I discussed this today.  She is going to talk this over with her psychiatrist, Ellis Savage.  I wrote her a letter today as  well.  -She does have an exaggerated startle response and has somewhat asymmetric blink.   These can be seen in other parkinsonian states, such as MSA, but without a DaT scan these would be impossible to differentiate while on Risperdal.  -I am going to go and do an MRI of the brain.  -She will have a CBC, copper, ceruloplasmin.  She does have uncontrolled hypothyroidism, but they are working on this with her primary care physician.  -She will call me for the results of her MRI of the brain and labs.  She should continue with her physical therapy.  I do recommend an ambulatory assistive device at all times, given the frequency of falls.  We discussed safety.  -  I. will see her back in the office in 6 months, but encouraged her to call me with any questions or concerns in the meantime.

## 2013-10-30 ENCOUNTER — Other Ambulatory Visit: Payer: Medicare Other

## 2013-10-30 ENCOUNTER — Ambulatory Visit: Payer: Medicare Other

## 2013-10-30 DIAGNOSIS — R259 Unspecified abnormal involuntary movements: Secondary | ICD-10-CM

## 2013-10-30 DIAGNOSIS — G219 Secondary parkinsonism, unspecified: Secondary | ICD-10-CM

## 2013-10-30 DIAGNOSIS — R269 Unspecified abnormalities of gait and mobility: Secondary | ICD-10-CM | POA: Diagnosis not present

## 2013-10-30 LAB — CERULOPLASMIN

## 2013-11-03 ENCOUNTER — Telehealth: Payer: Self-pay | Admitting: Neurology

## 2013-11-03 ENCOUNTER — Other Ambulatory Visit: Payer: Self-pay

## 2013-11-03 DIAGNOSIS — IMO0002 Reserved for concepts with insufficient information to code with codable children: Secondary | ICD-10-CM | POA: Diagnosis not present

## 2013-11-03 DIAGNOSIS — F4310 Post-traumatic stress disorder, unspecified: Secondary | ICD-10-CM | POA: Diagnosis not present

## 2013-11-03 NOTE — Telephone Encounter (Signed)
Faxed signed order to MRI dept.

## 2013-11-03 NOTE — Telephone Encounter (Signed)
MRI dept called. Pt. is scheduled for a MRI tomorrow. They need an electronically signed order available in EPIC prior to tomorrow's appt / Sherri S.

## 2013-11-03 NOTE — Telephone Encounter (Signed)
Maureen Keller does those.  Maureen Keller, can you take care of this?

## 2013-11-04 ENCOUNTER — Ambulatory Visit (HOSPITAL_COMMUNITY)
Admission: RE | Admit: 2013-11-04 | Discharge: 2013-11-04 | Disposition: A | Discharge status: Home / Self Care | Payer: Medicare Other | Source: Ambulatory Visit | Attending: Neurology | Admitting: Neurology

## 2013-11-04 DIAGNOSIS — R259 Unspecified abnormal involuntary movements: Secondary | ICD-10-CM | POA: Insufficient documentation

## 2013-11-04 DIAGNOSIS — Z9181 History of falling: Secondary | ICD-10-CM | POA: Diagnosis not present

## 2013-11-04 DIAGNOSIS — G9389 Other specified disorders of brain: Secondary | ICD-10-CM | POA: Diagnosis not present

## 2013-11-04 DIAGNOSIS — G939 Disorder of brain, unspecified: Secondary | ICD-10-CM | POA: Insufficient documentation

## 2013-11-04 DIAGNOSIS — R279 Unspecified lack of coordination: Secondary | ICD-10-CM | POA: Insufficient documentation

## 2013-11-04 DIAGNOSIS — R296 Repeated falls: Secondary | ICD-10-CM

## 2013-11-04 DIAGNOSIS — F330 Major depressive disorder, recurrent, mild: Secondary | ICD-10-CM | POA: Diagnosis not present

## 2013-11-04 DIAGNOSIS — F4310 Post-traumatic stress disorder, unspecified: Secondary | ICD-10-CM | POA: Diagnosis not present

## 2013-11-05 DIAGNOSIS — R269 Unspecified abnormalities of gait and mobility: Secondary | ICD-10-CM | POA: Diagnosis not present

## 2013-11-06 ENCOUNTER — Telehealth: Payer: Self-pay | Admitting: Neurology

## 2013-11-06 ENCOUNTER — Other Ambulatory Visit: Payer: Self-pay | Admitting: Neurology

## 2013-11-06 DIAGNOSIS — R9089 Other abnormal findings on diagnostic imaging of central nervous system: Secondary | ICD-10-CM

## 2013-11-06 NOTE — Telephone Encounter (Signed)
Message copied by Benay Spice on Fri Nov 06, 2013  1:13 PM ------      Message from: TAT, [place] S      Created: Fri Nov 06, 2013 12:50 PM       Jan, would you mind calling pt and telling her that I need to send her back to the MRI place to get a closer look at some areas that were just not well imaged on prior scan.  Order MRI brain with and without gad with pituitary protocol and dx: is abnormal MRI brain with intrasellar mass ------

## 2013-11-06 NOTE — Telephone Encounter (Signed)
Spoke with Okey Regal. Information given as per Dr. Arbutus Leas below. The patient is scheduled at [facility] for MRI brain with and without (pituitary protocol) on Tuesday, Dec. 2nd at 10:00 am; arrive at 9:45 am. She will need to come by our office for a BMP prior to the MRI to check kidney function. She will come in next week before Thanksgiving. No other issues voiced at this time. **Dr. Arbutus Leas, just a FYI.

## 2013-11-09 DIAGNOSIS — E785 Hyperlipidemia, unspecified: Secondary | ICD-10-CM | POA: Diagnosis not present

## 2013-11-09 DIAGNOSIS — E039 Hypothyroidism, unspecified: Secondary | ICD-10-CM | POA: Diagnosis not present

## 2013-11-10 DIAGNOSIS — R269 Unspecified abnormalities of gait and mobility: Secondary | ICD-10-CM | POA: Diagnosis not present

## 2013-11-11 ENCOUNTER — Other Ambulatory Visit (INDEPENDENT_AMBULATORY_CARE_PROVIDER_SITE_OTHER): Payer: Medicare Other

## 2013-11-11 DIAGNOSIS — R930 Abnormal findings on diagnostic imaging of skull and head, not elsewhere classified: Secondary | ICD-10-CM

## 2013-11-11 DIAGNOSIS — IMO0002 Reserved for concepts with insufficient information to code with codable children: Secondary | ICD-10-CM | POA: Diagnosis not present

## 2013-11-11 DIAGNOSIS — F4310 Post-traumatic stress disorder, unspecified: Secondary | ICD-10-CM | POA: Diagnosis not present

## 2013-11-11 DIAGNOSIS — R9089 Other abnormal findings on diagnostic imaging of central nervous system: Secondary | ICD-10-CM

## 2013-11-11 LAB — BASIC METABOLIC PANEL
Calcium: 9.6 mg/dL (ref 8.4–10.5)
Chloride: 105 mEq/L (ref 96–112)
GFR: 53.65 mL/min — ABNORMAL LOW (ref >60.00)
Glucose, Bld: 91 mg/dL (ref 70–99)
Potassium: 4.1 mEq/L (ref 3.5–5.1)
Sodium: 136 mEq/L (ref 135–145)

## 2013-11-13 DIAGNOSIS — R269 Unspecified abnormalities of gait and mobility: Secondary | ICD-10-CM | POA: Diagnosis not present

## 2013-11-16 DIAGNOSIS — R269 Unspecified abnormalities of gait and mobility: Secondary | ICD-10-CM | POA: Diagnosis not present

## 2013-11-17 ENCOUNTER — Other Ambulatory Visit: Payer: Self-pay

## 2013-11-17 ENCOUNTER — Ambulatory Visit (HOSPITAL_COMMUNITY)
Admission: RE | Admit: 2013-11-17 | Discharge: 2013-11-17 | Disposition: A | Discharge status: Home / Self Care | Payer: Medicare Other | Source: Ambulatory Visit | Attending: Neurology | Admitting: Neurology

## 2013-11-17 ENCOUNTER — Telehealth: Payer: Self-pay | Admitting: Neurology

## 2013-11-17 DIAGNOSIS — R9089 Other abnormal findings on diagnostic imaging of central nervous system: Secondary | ICD-10-CM

## 2013-11-17 DIAGNOSIS — G9389 Other specified disorders of brain: Secondary | ICD-10-CM | POA: Insufficient documentation

## 2013-11-17 MED ORDER — GADOBENATE DIMEGLUMINE 529 MG/ML IV SOLN
10.0000 mL | Freq: Once | INTRAVENOUS | Status: AC | PRN
  Administered 2013-11-17: 8 mL via INTRAVENOUS

## 2013-11-17 NOTE — Telephone Encounter (Signed)
Left v/m message for pt to call me re: MRI results

## 2013-11-17 NOTE — Telephone Encounter (Signed)
Referral put in [facility] and faxed to Dr.Stern.

## 2013-11-17 NOTE — Telephone Encounter (Signed)
Talked with pt.  Relayed MRI results.  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 Venetia Maxon to look at it.  Sarah, please send referral to Dr Venetia Maxon.

## 2013-11-17 NOTE — Telephone Encounter (Signed)
Pt returned Dr.Tat's phone call and would like her to call back.

## 2013-11-18 DIAGNOSIS — F4310 Post-traumatic stress disorder, unspecified: Secondary | ICD-10-CM | POA: Diagnosis not present

## 2013-11-18 DIAGNOSIS — F330 Major depressive disorder, recurrent, mild: Secondary | ICD-10-CM | POA: Diagnosis not present

## 2013-11-18 DIAGNOSIS — R269 Unspecified abnormalities of gait and mobility: Secondary | ICD-10-CM | POA: Diagnosis not present

## 2013-11-20 ENCOUNTER — Other Ambulatory Visit: Payer: Self-pay | Admitting: Nurse Practitioner

## 2013-11-20 DIAGNOSIS — I359 Nonrheumatic aortic valve disorder, unspecified: Secondary | ICD-10-CM

## 2013-11-30 DIAGNOSIS — E039 Hypothyroidism, unspecified: Secondary | ICD-10-CM | POA: Diagnosis not present

## 2013-12-02 DIAGNOSIS — IMO0002 Reserved for concepts with insufficient information to code with codable children: Secondary | ICD-10-CM | POA: Diagnosis not present

## 2013-12-02 DIAGNOSIS — F4310 Post-traumatic stress disorder, unspecified: Secondary | ICD-10-CM | POA: Diagnosis not present

## 2013-12-07 ENCOUNTER — Other Ambulatory Visit: Payer: Self-pay | Admitting: Cardiology

## 2013-12-08 ENCOUNTER — Encounter: Payer: Self-pay | Admitting: Cardiology

## 2013-12-08 ENCOUNTER — Ambulatory Visit (HOSPITAL_COMMUNITY): Payer: Medicare Other | Attending: Cardiology | Admitting: Radiology

## 2013-12-08 DIAGNOSIS — I059 Rheumatic mitral valve disease, unspecified: Secondary | ICD-10-CM | POA: Insufficient documentation

## 2013-12-08 DIAGNOSIS — I079 Rheumatic tricuspid valve disease, unspecified: Secondary | ICD-10-CM | POA: Insufficient documentation

## 2013-12-08 DIAGNOSIS — IMO0002 Reserved for concepts with insufficient information to code with codable children: Secondary | ICD-10-CM | POA: Diagnosis not present

## 2013-12-08 DIAGNOSIS — I359 Nonrheumatic aortic valve disorder, unspecified: Secondary | ICD-10-CM | POA: Insufficient documentation

## 2013-12-08 NOTE — Progress Notes (Signed)
Echocardiogram performed.  

## 2013-12-11 ENCOUNTER — Encounter: Payer: Self-pay | Admitting: General Surgery

## 2013-12-18 DIAGNOSIS — D353 Benign neoplasm of craniopharyngeal duct: Secondary | ICD-10-CM | POA: Diagnosis not present

## 2013-12-18 DIAGNOSIS — D352 Benign neoplasm of pituitary gland: Secondary | ICD-10-CM | POA: Diagnosis not present

## 2013-12-23 ENCOUNTER — Encounter: Payer: Self-pay | Admitting: Cardiology

## 2013-12-23 ENCOUNTER — Ambulatory Visit (INDEPENDENT_AMBULATORY_CARE_PROVIDER_SITE_OTHER): Payer: Medicare Other | Admitting: Cardiology

## 2013-12-23 VITALS — BP 160/90 | HR 55 | Ht 63.5 in | Wt 196.0 lb

## 2013-12-23 DIAGNOSIS — I359 Nonrheumatic aortic valve disorder, unspecified: Secondary | ICD-10-CM

## 2013-12-23 DIAGNOSIS — I059 Rheumatic mitral valve disease, unspecified: Secondary | ICD-10-CM | POA: Diagnosis not present

## 2013-12-23 DIAGNOSIS — I350 Nonrheumatic aortic (valve) stenosis: Secondary | ICD-10-CM | POA: Insufficient documentation

## 2013-12-23 DIAGNOSIS — I351 Nonrheumatic aortic (valve) insufficiency: Secondary | ICD-10-CM | POA: Insufficient documentation

## 2013-12-23 DIAGNOSIS — I10 Essential (primary) hypertension: Secondary | ICD-10-CM | POA: Diagnosis not present

## 2013-12-23 MED ORDER — AMLODIPINE BESYLATE 2.5 MG PO TABS
2.5000 mg | ORAL_TABLET | Freq: Every day | ORAL | Status: DC
Start: 2013-12-23 — End: 2014-12-24

## 2013-12-23 NOTE — Patient Instructions (Signed)
Your physician has recommended you make the following change in your medication: Start Amlodipine 2.5 Mg 1 tablet daily  Your physician has requested that you have an echocardiogram. Echocardiography is a painless test that uses sound waves to create images of your heart. It provides your doctor with information about the size and shape of your heart and how well your heart's chambers and valves are working. This procedure takes approximately one hour. There are no restrictions for this procedure. (Schedule 6 months from now)  Your physician recommends that you schedule a follow-up appointment in:2 weeks for a BP check with Nurse  Your physician wants you to follow-up in: 6 months with Dr Mallie Snooks will receive a reminder letter in the mail two months in advance. If you don't receive a letter, please call our office to schedule the follow-up appointment.

## 2013-12-23 NOTE — Progress Notes (Signed)
[place], [place], [place]  [address] Phone: [phone] Fax:  [phone]  Date:  12/23/2013   ID:  Maureen Keller, DOB March 01, 1946, MRN [id number]  PCP:  Kandice Hams, MD  Cardiologist:  Fransico Him, MD     History of Present Illness: Maureen Keller is a 68 y.o. female with a history of HTN, mild to moderate  AR/moderate to severe AS, moderate MR, mild pulmonary HTN, grade II diastolic dysfunction who presents today for followup.  She is doing well.  She denies any chest pain, SOB, DOE, LE edema, dizziness, palpitations or syncope.  She does not exercise but she is able to do her daily house chores without SOB or chest discomfort.     Wt Readings from Last 3 Encounters:  12/23/13 196 lb (88.905 kg)  12/11/13 203 lb (92.08 kg)  10/29/13 205 lb (92.987 kg)     Past Medical History  Diagnosis Date  . Hypertension   . Mitral valve disorder   . Depression     PTSD  . Hyperlipidemia   . Aortic stenosis     mild to moderate aortic insufficiency and moderate to severe aortic stenosis by echo 11/2013  . Hypothyroidism   . CHF (congestive heart failure)     /Diastolic dysfunction  . Pulmonary hypertension   . Glaucoma   . Chronic kidney disease (CKD), stage III (moderate)     Current Outpatient Prescriptions  Medication Sig Dispense Refill  . buPROPion (WELLBUTRIN XL) 150 MG 24 hr tablet       . lamoTRIgine (LAMICTAL) 100 MG tablet       . levothyroxine (SYNTHROID, LEVOTHROID) 200 MCG tablet       . losartan (COZAAR) 100 MG tablet       . metoprolol succinate (TOPROL-XL) 25 MG 24 hr tablet TAKE 1 TABLET ONCE DAILY.  30 tablet  0  . Multiple Vitamin (MULTIVITAMIN) tablet Take 1 tablet by mouth daily.      . Red Yeast Rice 600 MG CAPS Take 2 capsules by mouth daily.      . traZODone (DESYREL) 100 MG tablet 300 mg daily.       No current facility-administered medications for this visit.    Allergies:   No Known Allergies  Social History:  The patient   reports that she quit smoking about 37 years ago. She has never used smokeless tobacco. She reports that she drinks alcohol. She reports that she does not use illicit drugs.   Family History:  The patient's family history includes Heart disease in her father; Hypertension in her father.   ROS:  Please see the history of present illness.      All other systems reviewed and negative.   PHYSICAL EXAM: VS:  BP 160/90  Pulse 55  Ht 5' 3.5" (1.613 m)  Wt 196 lb (88.905 kg)  BMI 34.17 kg/m2 Well nourished, well developed, in no acute distress HEENT: normal Neck: no JVD Cardiac:  normal S1, S2; RRR; 2/6 late peaking systolic murmur at RUSB radiating to LLSB and carotid arteries Lungs:  clear to auscultation bilaterally, no wheezing, rhonchi or rales Abd: soft, nontender, no hepatomegaly Ext: no edema Skin: warm and dry Neuro:  CNs 2-12 intact, no focal abnormalities noted  EKG:  Sinus bradycardia with no ST changes       ASSESSMENT AND PLAN:  1. Mild to Moderate AR - she had an echo done recently showing stability of AR 2. Moderate to  severe AS - echo just done showed increased velocity of AV compared to 6 months ago.  She is completely asymptomatic from a cardiac standpoint so at this time will continue to monitor.  I have instructed her to call if she develops chest pain, SOB, DOE, edema or syncope.  - 2D echo in  6 months 3. HTN - elevated today and several times in the last few weeks at MD visits that it has been high  -continue metoprolol (cannot increase further due to resting bradycardia)/Cozaar  - start amlodipine 2.5mg  daily 4. Diastolic dysfunction  Nurse OV in 2 weeks to check BP  Followup with me in 6 months  Signed, Fransico Him, MD 12/23/2013 2:48 PM

## 2013-12-28 DIAGNOSIS — D497 Neoplasm of unspecified behavior of endocrine glands and other parts of nervous system: Secondary | ICD-10-CM | POA: Diagnosis not present

## 2013-12-28 DIAGNOSIS — R259 Unspecified abnormal involuntary movements: Secondary | ICD-10-CM | POA: Diagnosis not present

## 2013-12-30 DIAGNOSIS — E039 Hypothyroidism, unspecified: Secondary | ICD-10-CM | POA: Diagnosis not present

## 2014-01-03 ENCOUNTER — Other Ambulatory Visit: Payer: Self-pay | Admitting: Cardiology

## 2014-01-06 ENCOUNTER — Ambulatory Visit (INDEPENDENT_AMBULATORY_CARE_PROVIDER_SITE_OTHER): Payer: Medicare Other | Admitting: *Deleted

## 2014-01-06 VITALS — BP 134/64 | HR 60 | Ht 63.5 in | Wt 190.8 lb

## 2014-01-06 DIAGNOSIS — I10 Essential (primary) hypertension: Secondary | ICD-10-CM

## 2014-01-06 DIAGNOSIS — I359 Nonrheumatic aortic valve disorder, unspecified: Secondary | ICD-10-CM

## 2014-01-06 NOTE — Patient Instructions (Signed)
Pt here for BP check. Dr Radford Pax added amlodipine 2.5mg  daily 12/23/13. Pt states she is tolerating amlodipine without symptoms. BP results from today reviewed with Dr Radford Pax, no changes recommended. Pt advised no changes recommended by Dr Radford Pax.

## 2014-01-12 DIAGNOSIS — F4310 Post-traumatic stress disorder, unspecified: Secondary | ICD-10-CM | POA: Diagnosis not present

## 2014-01-12 DIAGNOSIS — IMO0002 Reserved for concepts with insufficient information to code with codable children: Secondary | ICD-10-CM | POA: Diagnosis not present

## 2014-01-13 DIAGNOSIS — H5319 Other subjective visual disturbances: Secondary | ICD-10-CM | POA: Diagnosis not present

## 2014-01-13 DIAGNOSIS — D444 Neoplasm of uncertain behavior of craniopharyngeal duct: Secondary | ICD-10-CM | POA: Diagnosis not present

## 2014-01-13 DIAGNOSIS — D443 Neoplasm of uncertain behavior of pituitary gland: Secondary | ICD-10-CM | POA: Diagnosis not present

## 2014-02-25 DIAGNOSIS — I10 Essential (primary) hypertension: Secondary | ICD-10-CM | POA: Diagnosis not present

## 2014-02-25 DIAGNOSIS — E039 Hypothyroidism, unspecified: Secondary | ICD-10-CM | POA: Diagnosis not present

## 2014-02-25 DIAGNOSIS — N183 Chronic kidney disease, stage 3 unspecified: Secondary | ICD-10-CM | POA: Diagnosis not present

## 2014-03-31 DIAGNOSIS — E039 Hypothyroidism, unspecified: Secondary | ICD-10-CM | POA: Diagnosis not present

## 2014-04-15 DIAGNOSIS — H4011X0 Primary open-angle glaucoma, stage unspecified: Secondary | ICD-10-CM | POA: Diagnosis not present

## 2014-04-15 DIAGNOSIS — H409 Unspecified glaucoma: Secondary | ICD-10-CM | POA: Diagnosis not present

## 2014-04-16 DIAGNOSIS — E039 Hypothyroidism, unspecified: Secondary | ICD-10-CM | POA: Diagnosis not present

## 2014-04-29 ENCOUNTER — Ambulatory Visit: Payer: Medicare Other | Admitting: Neurology

## 2014-05-05 ENCOUNTER — Encounter: Payer: Self-pay | Admitting: Neurology

## 2014-05-05 ENCOUNTER — Ambulatory Visit (INDEPENDENT_AMBULATORY_CARE_PROVIDER_SITE_OTHER): Payer: Medicare Other | Admitting: Neurology

## 2014-05-05 VITALS — BP 108/68 | HR 56 | Resp 14 | Ht 65.0 in | Wt 166.0 lb

## 2014-05-05 DIAGNOSIS — E236 Other disorders of pituitary gland: Secondary | ICD-10-CM | POA: Insufficient documentation

## 2014-05-05 DIAGNOSIS — G20 Parkinson's disease: Secondary | ICD-10-CM

## 2014-05-05 DIAGNOSIS — R251 Tremor, unspecified: Secondary | ICD-10-CM

## 2014-05-05 DIAGNOSIS — R259 Unspecified abnormal involuntary movements: Secondary | ICD-10-CM

## 2014-05-05 NOTE — Patient Instructions (Signed)
1. We are referring you to [facility] for a DaT scan. They will contact you directly to set up a time for this testing. If you do not hear from them they can be contacted at [phone]. PLEASE BRING DISC OF FILMS TO [place] AFTER SCAN COMPLETE.  2. Make an appt after scan complete to discuss results.

## 2014-05-05 NOTE — Progress Notes (Signed)
Maureen Keller was seen today in the movement disorders clinic for neurologic consultation at the request of Dr. Delfina Redwood.  The consultation is for the evaluation of falls and to r/o PD.  The first symptom(s) the patient noticed was balance problems and this was 2 years ago.  The falls were previously just occasionally, but over the course of the last year, she has fallen 17 times.  The falls can be related to walking up stairs, trying to lean over to pick up her dog, trying to sit on the bed and missing it.  She states that she "doesn't pick up her feet" enough.  She has been in PT for 3 months and she doesn't think that it has helped the falls.  She has noted some tremor in the L hand when she holds the hand up and holds something in it.  She is R hand dominant but it involves the L hand and it has been going on 1-2 years.  She has rarely noted rest tremor.  05/05/14 update:  The patient was seen today in followup.  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  She is now off of Risperdal and she is doing much better.  However, she is still having some falls.  She falls on slopes and uneven surfaces.  She fell 2 weeks ago but prior to that hadn't fallen in 3 months.  She mentions several concerning things.  She states that when she walks, "my feet are in one place and my body lurches."  She also states that she is having difficulty writing.  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  She subsequently saw Dr. Vertell Limber and he recommended having visual fields done and repeat MRI of the brain in 6 months.  I do not have her ophthalmology notes, but she apparently did have visual fields done and she states that those were good.   She had some labs done last visit.  Her copper was normal at 109.  For some reason, the ceruloplasmin was not done.  Her B12 was normal at 618.  She  had an echocardiogram done on 12/08/13 and there was mild to moderate AR and moderate to severe aortic stenosis.  PREVIOUS MEDICATIONS: risperdal  ALLERGIES:  No Known Allergies  CURRENT MEDICATIONS:  Current Outpatient Prescriptions on File Prior to Visit  Medication Sig Dispense Refill  . amLODipine (NORVASC) 2.5 MG tablet Take 1 tablet (2.5 mg total) by mouth daily.  30 tablet  11  . lamoTRIgine (LAMICTAL) 100 MG tablet Take 100 mg by mouth. 200 am, 100 afternoon, 300 pm      . losartan (COZAAR) 100 MG tablet Take 100 mg by mouth daily.       . metoprolol succinate (TOPROL-XL) 25 MG 24 hr tablet TAKE 1 TABLET ONCE DAILY.  30 tablet  5  . Multiple Vitamin (MULTIVITAMIN) tablet Take 1 tablet by mouth daily.      . Red Yeast Rice 600 MG CAPS Take 2 capsules by mouth daily.      . traZODone (DESYREL) 100 MG tablet 300 mg daily.       No current facility-administered medications on file prior to visit.    PAST MEDICAL HISTORY:   Past Medical History  Diagnosis Date  . Mitral valve disorder   . Depression  PTSD  . Hyperlipidemia   . Aortic stenosis     mild to moderate aortic insufficiency and moderate to severe aortic stenosis by echo 11/2013  . Hypothyroidism   . CHF (congestive heart failure)     /Diastolic dysfunction  . Pulmonary hypertension   . Glaucoma   . Chronic kidney disease (CKD), stage III (moderate)   . AR (aortic regurgitation)   . Hypertension     PAST SURGICAL HISTORY:   Past Surgical History  Procedure Laterality Date  . Cataract extraction    . Cardiac catheterization  11/04/2009    normal coronaries  . Carpal tunnel release  1982    SOCIAL HISTORY:   History   Social History  . Marital Status: Single    Spouse Name: N/A    Number of Children: N/A  . Years of Education: N/A   Occupational History  . Not on file.   Social History Main Topics  . Smoking status: Former Smoker    Quit date: 10/29/1976  . Smokeless tobacco: Never Used  .  Alcohol Use: Yes     Comment: Occ  . Drug Use: No  . Sexual Activity: Not on file   Other Topics Concern  . Not on file   Social History Narrative  . No narrative on file    FAMILY HISTORY:   Family Status  Relation Status Death Age  . Mother Deceased 47    Suicide (overdose), DM  . Father Deceased 59    Heart Disease, HTN, DM  . Sister Alive   . Daughter Alive     adopted  . Son Alive     adopted    ROS:  A complete 10 system review of systems was obtained and was unremarkable apart from what is mentioned above.  PHYSICAL EXAMINATION:    VITALS:   Filed Vitals:   05/05/14 0857  BP: 108/68  Pulse: 56  Resp: 14  Height: 5\' 5"  (1.651 m)  Weight: 166 lb (75.297 kg)    GEN:  The patient appears stated age and is in NAD. HEENT:  Normocephalic, atraumatic.  The mucous membranes are moist. The superficial temporal arteries are without ropiness or tenderness. CV:  RRR with 3/6 SEM radiating to the L carotid. Lungs:  CTAB Neck/HEME:  There are no carotid bruits bilaterally.  Neurological examination:  Orientation: The patient is alert and oriented x3. Fund of knowledge is appropriate.  Recent and remote memory are intact.  Attention and concentration are normal.    Able to name objects and repeat phrases. Cranial nerves: There is good facial symmetry. No asymmetric blink today but some mouth movements.   Pupils are equal round and reactive to light bilaterally. Fundoscopic exam reveals clear margins bilaterally. Extraocular muscles are intact. The visual fields are full to confrontational testing. The speech is fluent and clear. Soft palate rises symmetrically and there is no tongue deviation. Hearing is intact to conversational tone. Sensation: Sensation is intact to light touch throughout. Motor: Strength is 5/5 in the bilateral upper and lower extremities.   Shoulder shrug is equal and symmetric.  There is no pronator drift. Deep tendon reflexes: Deep tendon reflexes are  2/4 at the bilateral biceps, triceps, brachioradialis, patella and achilles. Plantar responses are downgoing bilaterally.  Movement examination: Tone: There is normal tone in the bilateral upper extremities.  The tone in the lower extremities is normal.  Abnormal movements: No tremor noted in arms.  As above, some mouth movements.   Coordination:  There is decremation with RAM's, seen primarily on the L with finger taps and toe taps Gait and Station: The patient has no difficulty arising out of a deep-seated chair without the use of the hands. The patient's stride length is improved but she is still slow with decreased arm swing.  ASSESSMENT/PLAN:  1.  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  I think that we should go ahead and proceed with the DAT scan given that tardive parkinsonism would be very rare.    -She is f/u with Dr. Vertell Limber for the repeat MRI brain re: Rathke's cleft cyst.  -I do recommend an ambulatory assistive device at all times, given the frequency of falls.  We discussed safety.  -I. will see her back in the office after the above has been completed.  In the meantime, I will try to get a copy of her ophthalmology notes from Dr. Bing Plume.

## 2014-05-06 ENCOUNTER — Telehealth: Payer: Self-pay | Admitting: Neurology

## 2014-05-06 DIAGNOSIS — H409 Unspecified glaucoma: Secondary | ICD-10-CM | POA: Diagnosis not present

## 2014-05-06 DIAGNOSIS — H4011X0 Primary open-angle glaucoma, stage unspecified: Secondary | ICD-10-CM | POA: Diagnosis not present

## 2014-05-06 NOTE — Telephone Encounter (Signed)
Spoke with patient and she is already scheduled for DaT scan on 05/27/2014. She will contact her psychiatrist to see about being off the Wellbutrin for 8 days and call and let us know. Awaiting call back.

## 2014-05-06 NOTE — Telephone Encounter (Signed)
Left message on machine for patient to call back to discuss with her about her Wellbutrin and need to stop for 8 days prior to DaT scan. Per Dr Tat we will need her psychiatrist approval. Awaiting call back to discuss.

## 2014-06-01 ENCOUNTER — Telehealth: Payer: Self-pay | Admitting: Neurology

## 2014-06-01 NOTE — Telephone Encounter (Signed)
Spoke with Maureen Keller. Patient cancelled DaT scan on June 8th and did not want to reschedule.

## 2014-06-07 DIAGNOSIS — H4011X0 Primary open-angle glaucoma, stage unspecified: Secondary | ICD-10-CM | POA: Diagnosis not present

## 2014-06-07 DIAGNOSIS — H409 Unspecified glaucoma: Secondary | ICD-10-CM | POA: Diagnosis not present

## 2014-06-15 DIAGNOSIS — E039 Hypothyroidism, unspecified: Secondary | ICD-10-CM | POA: Diagnosis not present

## 2014-06-21 DIAGNOSIS — R21 Rash and other nonspecific skin eruption: Secondary | ICD-10-CM | POA: Diagnosis not present

## 2014-06-23 ENCOUNTER — Other Ambulatory Visit: Payer: Self-pay | Admitting: Cardiology

## 2014-07-08 DIAGNOSIS — L723 Sebaceous cyst: Secondary | ICD-10-CM | POA: Diagnosis not present

## 2014-07-08 DIAGNOSIS — E7800 Pure hypercholesterolemia, unspecified: Secondary | ICD-10-CM | POA: Diagnosis not present

## 2014-07-08 DIAGNOSIS — I119 Hypertensive heart disease without heart failure: Secondary | ICD-10-CM | POA: Diagnosis not present

## 2014-07-08 DIAGNOSIS — I359 Nonrheumatic aortic valve disorder, unspecified: Secondary | ICD-10-CM | POA: Diagnosis not present

## 2014-07-09 ENCOUNTER — Other Ambulatory Visit: Payer: Self-pay | Admitting: Dermatology

## 2014-07-09 DIAGNOSIS — L821 Other seborrheic keratosis: Secondary | ICD-10-CM | POA: Diagnosis not present

## 2014-07-09 DIAGNOSIS — D1801 Hemangioma of skin and subcutaneous tissue: Secondary | ICD-10-CM | POA: Diagnosis not present

## 2014-07-09 DIAGNOSIS — L723 Sebaceous cyst: Secondary | ICD-10-CM | POA: Diagnosis not present

## 2014-07-13 DIAGNOSIS — F3342 Major depressive disorder, recurrent, in full remission: Secondary | ICD-10-CM | POA: Diagnosis not present

## 2014-07-26 DIAGNOSIS — H409 Unspecified glaucoma: Secondary | ICD-10-CM | POA: Diagnosis not present

## 2014-07-26 DIAGNOSIS — H4011X0 Primary open-angle glaucoma, stage unspecified: Secondary | ICD-10-CM | POA: Diagnosis not present

## 2014-07-30 DIAGNOSIS — E039 Hypothyroidism, unspecified: Secondary | ICD-10-CM | POA: Diagnosis not present

## 2014-08-09 ENCOUNTER — Ambulatory Visit (HOSPITAL_COMMUNITY): Payer: Medicare Other | Attending: Cardiology

## 2014-08-09 DIAGNOSIS — I080 Rheumatic disorders of both mitral and aortic valves: Secondary | ICD-10-CM | POA: Insufficient documentation

## 2014-08-09 DIAGNOSIS — I359 Nonrheumatic aortic valve disorder, unspecified: Secondary | ICD-10-CM

## 2014-08-09 DIAGNOSIS — I350 Nonrheumatic aortic (valve) stenosis: Secondary | ICD-10-CM

## 2014-08-09 NOTE — Progress Notes (Signed)
2D Echo completed. 08/09/2014

## 2014-08-10 ENCOUNTER — Other Ambulatory Visit: Payer: Self-pay | Admitting: General Surgery

## 2014-08-10 DIAGNOSIS — I350 Nonrheumatic aortic (valve) stenosis: Secondary | ICD-10-CM

## 2014-08-17 ENCOUNTER — Other Ambulatory Visit: Payer: Self-pay

## 2014-08-17 ENCOUNTER — Ambulatory Visit (INDEPENDENT_AMBULATORY_CARE_PROVIDER_SITE_OTHER): Payer: Medicare Other | Admitting: Cardiology

## 2014-08-17 ENCOUNTER — Encounter: Payer: Self-pay | Admitting: Cardiology

## 2014-08-17 VITALS — BP 112/62 | HR 52 | Ht 64.0 in | Wt 157.0 lb

## 2014-08-17 DIAGNOSIS — I359 Nonrheumatic aortic valve disorder, unspecified: Secondary | ICD-10-CM

## 2014-08-17 DIAGNOSIS — E039 Hypothyroidism, unspecified: Secondary | ICD-10-CM | POA: Diagnosis not present

## 2014-08-17 DIAGNOSIS — I350 Nonrheumatic aortic (valve) stenosis: Secondary | ICD-10-CM

## 2014-08-17 DIAGNOSIS — I059 Rheumatic mitral valve disease, unspecified: Secondary | ICD-10-CM | POA: Diagnosis not present

## 2014-08-17 DIAGNOSIS — I10 Essential (primary) hypertension: Secondary | ICD-10-CM

## 2014-08-17 DIAGNOSIS — I351 Nonrheumatic aortic (valve) insufficiency: Secondary | ICD-10-CM

## 2014-08-17 DIAGNOSIS — Z1231 Encounter for screening mammogram for malignant neoplasm of breast: Secondary | ICD-10-CM

## 2014-08-17 LAB — BASIC METABOLIC PANEL
BUN: 11 mg/dL (ref 6–23)
CHLORIDE: 104 meq/L (ref 96–112)
CO2: 28 mEq/L (ref 19–32)
Calcium: 9.7 mg/dL (ref 8.4–10.5)
Creatinine, Ser: 1.2 mg/dL (ref 0.4–1.2)
GFR: 48.8 mL/min — AB (ref >60.00)
GLUCOSE: 94 mg/dL (ref 70–99)
POTASSIUM: 4.3 meq/L (ref 3.5–5.1)
Sodium: 139 mEq/L (ref 135–145)

## 2014-08-17 NOTE — Patient Instructions (Signed)
Your physician recommends that you continue on your current medications as directed. Please refer to the Current Medication list given to you today.  Your physician recommends that you go to the lab today for a BMET  Your physician wants you to follow-up in: 6 months with Dr Turner You will receive a reminder letter in the mail two months in advance. If you don't receive a letter, please call our office to schedule the follow-up appointment.  

## 2014-08-17 NOTE — Progress Notes (Signed)
[place], [address], [place]  [address] Phone: [phone] Fax:  [phone]  Date:  08/17/2014   ID:  Maureen Keller, DOB 1946/02/23, MRN [id number]  PCP:  Kandice Hams, MD  Cardiologist:  Fransico Him, MD     History of Present Illness: Maureen Keller is a 68 y.o. female with a history of HTN,  moderate AR/moderate AS, moderate MR, mild pulmonary HTN, grade II diastolic dysfunction who presents today for followup.  In the past year she has lost 70 pounds and is exercising daily about 3 miles daily.  She is doing well. She denies any chest pain, SOB, DOE, LE edema, dizziness, palpitations or syncope.    Wt Readings from Last 3 Encounters:  08/17/14 157 lb (71.215 kg)  05/05/14 166 lb (75.297 kg)  01/06/14 190 lb 12 oz (86.524 kg)     Past Medical History  Diagnosis Date  . Mitral valve disorder     moderate MR on echo 07/2014  . Depression     PTSD  . Hyperlipidemia   . Aortic stenosis      moderate aortic insufficiency and moderate  aortic stenosis by echo 07/2014  . Hypothyroidism   . CHF (congestive heart failure)     /Diastolic dysfunction  . Pulmonary hypertension   . Glaucoma   . Chronic kidney disease (CKD), stage III (moderate)   . AR (aortic regurgitation)   . Hypertension     Current Outpatient Prescriptions  Medication Sig Dispense Refill  . amLODipine (NORVASC) 2.5 MG tablet Take 1 tablet (2.5 mg total) by mouth daily.  30 tablet  11  . bimatoprost (LUMIGAN) 0.01 % SOLN 1 drop at bedtime.      . Calcium Carbonate-Vitamin D (CALCIUM + D PO) Take by mouth daily.      Marland Kitchen lamoTRIgine (LAMICTAL) 100 MG tablet Take 100 mg by mouth. 200 am, 100 afternoon, 300 pm      . levothyroxine (SYNTHROID, LEVOTHROID) 175 MCG tablet Take 200 mcg by mouth daily before breakfast.       . losartan (COZAAR) 100 MG tablet Take 100 mg by mouth daily.       . metoprolol succinate (TOPROL-XL) 25 MG 24 hr tablet TAKE 1 TABLET ONCE DAILY.  30 tablet  1  . Multiple  Vitamin (MULTIVITAMIN) tablet Take 1 tablet by mouth daily.      . Red Yeast Rice 600 MG CAPS Take 2 capsules by mouth daily.      . traZODone (DESYREL) 100 MG tablet 300 mg daily.      Marland Kitchen buPROPion (WELLBUTRIN XL) 300 MG 24 hr tablet Take 300 mg by mouth daily.       No current facility-administered medications for this visit.    Allergies:    Allergies  Allergen Reactions  . Risperdal [Risperidone]     sever mental and physical degeneration     Social History:  The patient  reports that she quit smoking about 37 years ago. She has never used smokeless tobacco. She reports that she drinks alcohol. She reports that she does not use illicit drugs.   Family History:  The patient's family history includes Heart disease in her father; Hypertension in her father.   ROS:  Please see the history of present illness.      All other systems reviewed and negative.   PHYSICAL EXAM: VS:  BP 112/62  Pulse 52  Ht 5\' 4"  (1.626 m)  Wt 157 lb (71.215 kg)  BMI 26.94 kg/m2 Well nourished, well developed, in no acute distress HEENT: normal Neck: no JVD Cardiac:  normal S1, S2; RRR; 2/6 SM at RUSB to LLSB with radiation to carotid arteries bilaterally Lungs:  clear to auscultation bilaterally, no wheezing, rhonchi or rales Abd: soft, nontender, no hepatomegaly Ext: no edema Skin: warm and dry Neuro:  CNs 2-12 intact, no focal abnormalities noted  ASSESSMENT AND PLAN:  1.  Moderate AR - she had an echo done recently showing stability of AR 2.  Moderate  AS - echo just done showed stable AV except for slightly more AI compared to 6 months ago. She is completely asymptomatic from a cardiac standpoint so at this time will continue to monitor. I have instructed her to call if she develops chest pain, SOB, DOE, edema or syncope. - 2D echo in 6 months  3. HTN - well controlled - continue metoprolol/amlodipine/Cozaar - check BMET 4. Diastolic dysfunction   Followup with me in 6 months       Signed, Fransico Him, MD 08/17/2014 3:03 PM

## 2014-08-18 ENCOUNTER — Encounter: Payer: Self-pay | Admitting: General Surgery

## 2014-08-24 DIAGNOSIS — D497 Neoplasm of unspecified behavior of endocrine glands and other parts of nervous system: Secondary | ICD-10-CM | POA: Diagnosis not present

## 2014-08-25 ENCOUNTER — Ambulatory Visit
Admission: RE | Admit: 2014-08-25 | Discharge: 2014-08-25 | Disposition: A | Discharge status: Home / Self Care | Payer: Medicare Other | Source: Ambulatory Visit

## 2014-08-25 ENCOUNTER — Other Ambulatory Visit: Payer: Self-pay | Admitting: Cardiology

## 2014-08-25 DIAGNOSIS — Z1231 Encounter for screening mammogram for malignant neoplasm of breast: Secondary | ICD-10-CM

## 2014-08-26 DIAGNOSIS — D497 Neoplasm of unspecified behavior of endocrine glands and other parts of nervous system: Secondary | ICD-10-CM | POA: Diagnosis not present

## 2014-08-26 DIAGNOSIS — H748X9 Other specified disorders of middle ear and mastoid, unspecified ear: Secondary | ICD-10-CM | POA: Diagnosis not present

## 2014-09-02 DIAGNOSIS — Z01419 Encounter for gynecological examination (general) (routine) without abnormal findings: Secondary | ICD-10-CM | POA: Diagnosis not present

## 2014-09-03 DIAGNOSIS — M79609 Pain in unspecified limb: Secondary | ICD-10-CM | POA: Diagnosis not present

## 2014-09-03 DIAGNOSIS — M6530 Trigger finger, unspecified finger: Secondary | ICD-10-CM | POA: Diagnosis not present

## 2014-09-03 DIAGNOSIS — Z23 Encounter for immunization: Secondary | ICD-10-CM | POA: Diagnosis not present

## 2014-09-16 DIAGNOSIS — M65311 Trigger thumb, right thumb: Secondary | ICD-10-CM | POA: Diagnosis not present

## 2014-09-20 DIAGNOSIS — Z6827 Body mass index (BMI) 27.0-27.9, adult: Secondary | ICD-10-CM | POA: Diagnosis not present

## 2014-09-20 DIAGNOSIS — R259 Unspecified abnormal involuntary movements: Secondary | ICD-10-CM | POA: Diagnosis not present

## 2014-09-20 DIAGNOSIS — D497 Neoplasm of unspecified behavior of endocrine glands and other parts of nervous system: Secondary | ICD-10-CM | POA: Diagnosis not present

## 2014-09-21 DIAGNOSIS — H4011X1 Primary open-angle glaucoma, mild stage: Secondary | ICD-10-CM | POA: Diagnosis not present

## 2014-09-23 DIAGNOSIS — M2011 Hallux valgus (acquired), right foot: Secondary | ICD-10-CM | POA: Diagnosis not present

## 2014-09-23 DIAGNOSIS — M2041 Other hammer toe(s) (acquired), right foot: Secondary | ICD-10-CM | POA: Diagnosis not present

## 2014-09-28 DIAGNOSIS — G8918 Other acute postprocedural pain: Secondary | ICD-10-CM | POA: Diagnosis not present

## 2014-09-28 DIAGNOSIS — M2011 Hallux valgus (acquired), right foot: Secondary | ICD-10-CM | POA: Diagnosis not present

## 2014-09-28 DIAGNOSIS — M2041 Other hammer toe(s) (acquired), right foot: Secondary | ICD-10-CM | POA: Diagnosis not present

## 2014-09-28 DIAGNOSIS — M205X1 Other deformities of toe(s) (acquired), right foot: Secondary | ICD-10-CM | POA: Diagnosis not present

## 2014-10-08 DIAGNOSIS — M2041 Other hammer toe(s) (acquired), right foot: Secondary | ICD-10-CM | POA: Diagnosis not present

## 2014-10-08 DIAGNOSIS — M2011 Hallux valgus (acquired), right foot: Secondary | ICD-10-CM | POA: Diagnosis not present

## 2014-10-14 DIAGNOSIS — M65311 Trigger thumb, right thumb: Secondary | ICD-10-CM | POA: Diagnosis not present

## 2014-10-18 DIAGNOSIS — E039 Hypothyroidism, unspecified: Secondary | ICD-10-CM | POA: Diagnosis not present

## 2014-10-26 DIAGNOSIS — H4011X1 Primary open-angle glaucoma, mild stage: Secondary | ICD-10-CM | POA: Diagnosis not present

## 2014-11-04 ENCOUNTER — Telehealth: Payer: Self-pay | Admitting: Cardiology

## 2014-11-04 NOTE — Telephone Encounter (Signed)
Patient needs to come in to see PA tomorrow

## 2014-11-04 NOTE — Telephone Encounter (Signed)
New problem   Pt has been having chest pain off and on for last ten days, isn't having pains at present. Please call pt.

## 2014-11-04 NOTE — Telephone Encounter (Signed)
Patient st that for the last 10-14 days she has experienced chest discomfort. She st it is more of a pressure than a pain. Pt st the discomfort is never in the same place -- it moves from the middle of her chest to her sides and sometimes to her back. It happens mostly at bedtime, but is not bad enough that she cannot fall asleep. When she wakes up the discomfort is gone. Pt thinks it could possibly just be heart burn, but is nervous because heart burn has never bothered her like this before.  To Dr. Radford Pax for review and recommendations.

## 2014-11-05 ENCOUNTER — Encounter: Payer: Self-pay | Admitting: *Deleted

## 2014-11-05 NOTE — Telephone Encounter (Signed)
Appointment confirmed for Monday at 1000.

## 2014-11-08 ENCOUNTER — Ambulatory Visit (INDEPENDENT_AMBULATORY_CARE_PROVIDER_SITE_OTHER): Payer: Medicare Other | Admitting: Cardiology

## 2014-11-08 ENCOUNTER — Encounter: Payer: Self-pay | Admitting: Cardiology

## 2014-11-08 VITALS — BP 134/72 | HR 50 | Ht 64.0 in | Wt 157.0 lb

## 2014-11-08 DIAGNOSIS — I350 Nonrheumatic aortic (valve) stenosis: Secondary | ICD-10-CM

## 2014-11-08 DIAGNOSIS — R079 Chest pain, unspecified: Secondary | ICD-10-CM

## 2014-11-08 DIAGNOSIS — I10 Essential (primary) hypertension: Secondary | ICD-10-CM | POA: Diagnosis not present

## 2014-11-08 DIAGNOSIS — I351 Nonrheumatic aortic (valve) insufficiency: Secondary | ICD-10-CM | POA: Diagnosis not present

## 2014-11-08 NOTE — Patient Instructions (Addendum)
Your physician has requested that you have an echocardiogram. Echocardiography is a painless test that uses sound waves to create images of your heart. It provides your doctor with information about the size and shape of your heart and how well your heart's chambers and valves are working. This procedure takes approximately one hour. There are no restrictions for this procedure.  Your physician has requested that you have an Stress Myoview. For further information please visit [email or web address]. Please also follow instruction sheet, as given.  Your physician wants you to follow-up in: 6 month with Dr. Radford Pax. You will receive a reminder letter in the mail two months in advance. If you don't receive a letter, please call our office to schedule the follow-up appointment.

## 2014-11-08 NOTE — Progress Notes (Signed)
[facility], [address], [place]  [address] Phone: [phone] Fax:  [phone]  Date:  11/08/2014   ID:  Maureen, Keller 11-11-1946, MRN [id number]  PCP:  Kandice Hams, MD  Cardiologist:  Fransico Him, MD    History of Present Illness: Maureen Keller is a 68 y.o. female with a history of HTN, moderate AR/moderate AS, moderate MR, mild pulmonary HTN, grade II diastolic dysfunction who presents today for followup. She is doing well. She has been having some episodes of CP that usually occur in the evening when she is sitting on the couch watching TV.  She describes it as a pressure from the inside that move all over her chest and into her stomach.  One time it went into her right arm.  She also has had some sharp pains in her back.  She denies any SOB, diaphoresis or nausea with the pain.  The pain usually lasts up to 4 hours constantly.  It is nonexertional.  She thinks it might be gas.  She walks about 2-3 miles daily and does not get the pain then.  She denies any SOB, DOE, LE edema, dizziness, palpitations or syncope   Wt Readings from Last 3 Encounters:  11/08/14 157 lb (71.215 kg)  08/17/14 157 lb (71.215 kg)  05/05/14 166 lb (75.297 kg)     Past Medical History  Diagnosis Date  . Mitral valve disorder     moderate MR on echo 07/2014  . Depression     PTSD  . Hyperlipidemia   . Aortic stenosis      moderate aortic insufficiency and moderate  aortic stenosis by echo 07/2014  . Hypothyroidism   . CHF (congestive heart failure)     /Diastolic dysfunction  . Pulmonary hypertension   . Glaucoma   . Chronic kidney disease (CKD), stage III (moderate)   . AR (aortic regurgitation)   . Hypertension     Current Outpatient Prescriptions  Medication Sig Dispense Refill  . amLODipine (NORVASC) 2.5 MG tablet Take 1 tablet (2.5 mg total) by mouth daily. 30 tablet 11  . bimatoprost (LUMIGAN) 0.01 % SOLN 1 drop at bedtime.    . Calcium Carbonate-Vitamin D (CALCIUM +  D PO) Take by mouth daily.    Marland Kitchen lamoTRIgine (LAMICTAL) 100 MG tablet Take 100 mg by mouth. 200 am, 100 afternoon, 300 pm    . levothyroxine (SYNTHROID, LEVOTHROID) 175 MCG tablet Take 200 mcg by mouth daily before breakfast.     . losartan (COZAAR) 100 MG tablet Take 100 mg by mouth daily.     . metoprolol succinate (TOPROL-XL) 25 MG 24 hr tablet TAKE 1 TABLET ONCE DAILY. 30 tablet 5  . Multiple Vitamin (MULTIVITAMIN) tablet Take 1 tablet by mouth daily.    . Red Yeast Rice 600 MG CAPS Take 2 capsules by mouth daily.    . traZODone (DESYREL) 100 MG tablet 300 mg daily.     No current facility-administered medications for this visit.    Allergies:    Allergies  Allergen Reactions  . Risperdal [Risperidone]     sever mental and physical degeneration     Social History:  The patient  reports that she quit smoking about 38 years ago. She has never used smokeless tobacco. She reports that she drinks alcohol. She reports that she does not use illicit drugs.   Family History:  The patient's family history includes Diabetes in her father and mother; Heart disease in her  father; Hypertension in her father; Suicidality (age of onset: 29) in her mother.   ROS:  Please see the history of present illness.      All other systems reviewed and negative.   PHYSICAL EXAM: VS:  BP 134/72 mmHg  Pulse 50  Ht 5\' 4"  (1.626 m)  Wt 157 lb (71.215 kg)  BMI 26.94 kg/m2 Well nourished, well developed, in no acute distress HEENT: normal Neck: no JVD Cardiac:  normal S1, S2; RRR; 2/6 late peaking systolic murmur at RUSB to LLSB Lungs:  clear to auscultation bilaterally, no wheezing, rhonchi or rales Abd: soft, nontender, no hepatomegaly Ext: no edema Skin: warm and dry Neuro:  CNs 2-12 intact, no focal abnormalities noted  EKG:     Sinus bradycardia at 50bpm with no ST changes  ASSESSMENT AND PLAN:  1. Moderate AR - slightly worse on echo in August 2. Moderate AS - She is now having some problems  with chest pain that is somewhat atypical but she has had frequent episodes lately.  I will repeat an echo to reassess for progression of AS. 3. HTN - well controlled - continue metoprolol/amlodipine/Cozaar 4. Diastolic dysfunction 5. Chest pain - atypical in presentation but concerning since she had moderate AS on last echo.  I will repeat an echo to reassess for progression.  I will also get a stress myoview to rule out ischemia and assess for exercised induced hypotension/exercise intolerance/SOB or chest pain that may indicate that the AS has progressed.    Followup with me in 6 months   Signed, Fransico Him, MD [facility] 11/08/2014 10:23 AM

## 2014-11-15 ENCOUNTER — Encounter (HOSPITAL_COMMUNITY): Payer: Medicare Other

## 2014-11-15 ENCOUNTER — Other Ambulatory Visit (HOSPITAL_COMMUNITY): Payer: Medicare Other

## 2014-12-02 DIAGNOSIS — H4011X2 Primary open-angle glaucoma, moderate stage: Secondary | ICD-10-CM | POA: Diagnosis not present

## 2014-12-24 ENCOUNTER — Other Ambulatory Visit: Payer: Self-pay | Admitting: Cardiology

## 2014-12-27 DIAGNOSIS — H4011X2 Primary open-angle glaucoma, moderate stage: Secondary | ICD-10-CM | POA: Diagnosis not present

## 2014-12-30 DIAGNOSIS — M2012 Hallux valgus (acquired), left foot: Secondary | ICD-10-CM | POA: Diagnosis not present

## 2014-12-30 DIAGNOSIS — M2042 Other hammer toe(s) (acquired), left foot: Secondary | ICD-10-CM | POA: Diagnosis not present

## 2015-01-10 DIAGNOSIS — F3342 Major depressive disorder, recurrent, in full remission: Secondary | ICD-10-CM | POA: Diagnosis not present

## 2015-01-18 DIAGNOSIS — E039 Hypothyroidism, unspecified: Secondary | ICD-10-CM | POA: Diagnosis not present

## 2015-01-18 DIAGNOSIS — Z23 Encounter for immunization: Secondary | ICD-10-CM | POA: Diagnosis not present

## 2015-01-18 DIAGNOSIS — I10 Essential (primary) hypertension: Secondary | ICD-10-CM | POA: Diagnosis not present

## 2015-01-18 DIAGNOSIS — Z1389 Encounter for screening for other disorder: Secondary | ICD-10-CM | POA: Diagnosis not present

## 2015-01-18 DIAGNOSIS — E2839 Other primary ovarian failure: Secondary | ICD-10-CM | POA: Diagnosis not present

## 2015-01-18 DIAGNOSIS — I350 Nonrheumatic aortic (valve) stenosis: Secondary | ICD-10-CM | POA: Diagnosis not present

## 2015-01-18 DIAGNOSIS — Z0000 Encounter for general adult medical examination without abnormal findings: Secondary | ICD-10-CM | POA: Diagnosis not present

## 2015-01-18 DIAGNOSIS — E785 Hyperlipidemia, unspecified: Secondary | ICD-10-CM | POA: Diagnosis not present

## 2015-01-18 DIAGNOSIS — F4310 Post-traumatic stress disorder, unspecified: Secondary | ICD-10-CM | POA: Diagnosis not present

## 2015-01-19 DIAGNOSIS — M65311 Trigger thumb, right thumb: Secondary | ICD-10-CM | POA: Diagnosis not present

## 2015-01-31 ENCOUNTER — Other Ambulatory Visit (HOSPITAL_COMMUNITY): Payer: Medicare Other

## 2015-02-02 DIAGNOSIS — E2839 Other primary ovarian failure: Secondary | ICD-10-CM | POA: Diagnosis not present

## 2015-02-09 ENCOUNTER — Encounter: Payer: Self-pay | Admitting: Cardiology

## 2015-02-11 ENCOUNTER — Ambulatory Visit (HOSPITAL_COMMUNITY): Payer: Medicare Other | Attending: Cardiovascular Disease | Admitting: Radiology

## 2015-02-11 ENCOUNTER — Other Ambulatory Visit (HOSPITAL_COMMUNITY): Payer: Medicare Other

## 2015-02-11 DIAGNOSIS — N189 Chronic kidney disease, unspecified: Secondary | ICD-10-CM | POA: Insufficient documentation

## 2015-02-11 DIAGNOSIS — I509 Heart failure, unspecified: Secondary | ICD-10-CM | POA: Diagnosis not present

## 2015-02-11 DIAGNOSIS — I129 Hypertensive chronic kidney disease with stage 1 through stage 4 chronic kidney disease, or unspecified chronic kidney disease: Secondary | ICD-10-CM | POA: Insufficient documentation

## 2015-02-11 DIAGNOSIS — I359 Nonrheumatic aortic valve disorder, unspecified: Secondary | ICD-10-CM

## 2015-02-11 DIAGNOSIS — I059 Rheumatic mitral valve disease, unspecified: Secondary | ICD-10-CM

## 2015-02-11 DIAGNOSIS — I080 Rheumatic disorders of both mitral and aortic valves: Secondary | ICD-10-CM | POA: Insufficient documentation

## 2015-02-11 DIAGNOSIS — Z87891 Personal history of nicotine dependence: Secondary | ICD-10-CM | POA: Diagnosis not present

## 2015-02-11 DIAGNOSIS — I350 Nonrheumatic aortic (valve) stenosis: Secondary | ICD-10-CM

## 2015-02-11 DIAGNOSIS — I272 Other secondary pulmonary hypertension: Secondary | ICD-10-CM | POA: Diagnosis not present

## 2015-02-11 DIAGNOSIS — R079 Chest pain, unspecified: Secondary | ICD-10-CM

## 2015-02-11 NOTE — Progress Notes (Signed)
Echocardiogram performed.  

## 2015-02-16 ENCOUNTER — Telehealth: Payer: Self-pay

## 2015-02-16 DIAGNOSIS — I351 Nonrheumatic aortic (valve) insufficiency: Secondary | ICD-10-CM

## 2015-02-16 DIAGNOSIS — I059 Rheumatic mitral valve disease, unspecified: Secondary | ICD-10-CM

## 2015-02-16 DIAGNOSIS — I350 Nonrheumatic aortic (valve) stenosis: Secondary | ICD-10-CM

## 2015-02-16 NOTE — Telephone Encounter (Signed)
-----   Message from Sueanne Margarita, MD sent at 02/14/2015  2:19 PM EST ----- Please let patient now that echo showed normal LVF with mildly thickened heart muscle, moderate AS , moderate AR, mild to moderate MR - repeat echo in 1 year

## 2015-02-16 NOTE — Telephone Encounter (Signed)
Patient informed of results and verbal understanding expressed.   Repeat ECHO ordered to be scheduled in 1 year.

## 2015-03-02 DIAGNOSIS — H6121 Impacted cerumen, right ear: Secondary | ICD-10-CM | POA: Diagnosis not present

## 2015-03-10 ENCOUNTER — Other Ambulatory Visit: Payer: Self-pay | Admitting: Cardiology

## 2015-04-14 DIAGNOSIS — S80862A Insect bite (nonvenomous), left lower leg, initial encounter: Secondary | ICD-10-CM | POA: Diagnosis not present

## 2015-04-14 DIAGNOSIS — W57XXXA Bitten or stung by nonvenomous insect and other nonvenomous arthropods, initial encounter: Secondary | ICD-10-CM | POA: Diagnosis not present

## 2015-04-22 DIAGNOSIS — L989 Disorder of the skin and subcutaneous tissue, unspecified: Secondary | ICD-10-CM | POA: Diagnosis not present

## 2015-04-28 DIAGNOSIS — L570 Actinic keratosis: Secondary | ICD-10-CM | POA: Diagnosis not present

## 2015-05-12 ENCOUNTER — Encounter: Payer: Self-pay | Admitting: Cardiology

## 2015-05-12 ENCOUNTER — Ambulatory Visit (INDEPENDENT_AMBULATORY_CARE_PROVIDER_SITE_OTHER): Payer: Medicare Other | Admitting: Cardiology

## 2015-05-12 VITALS — BP 120/58 | HR 48 | Ht 64.0 in | Wt 160.4 lb

## 2015-05-12 DIAGNOSIS — I059 Rheumatic mitral valve disease, unspecified: Secondary | ICD-10-CM | POA: Diagnosis not present

## 2015-05-12 DIAGNOSIS — I10 Essential (primary) hypertension: Secondary | ICD-10-CM

## 2015-05-12 DIAGNOSIS — I351 Nonrheumatic aortic (valve) insufficiency: Secondary | ICD-10-CM | POA: Diagnosis not present

## 2015-05-12 DIAGNOSIS — I350 Nonrheumatic aortic (valve) stenosis: Secondary | ICD-10-CM | POA: Diagnosis not present

## 2015-05-12 DIAGNOSIS — R079 Chest pain, unspecified: Secondary | ICD-10-CM | POA: Insufficient documentation

## 2015-05-12 NOTE — Patient Instructions (Addendum)
Medication Instructions:  Your physician recommends that you continue on your current medications as directed. Please refer to the Current Medication list given to you today.   Labwork: None  Testing/Procedures: Dr. Radford Pax recommends you have a STRESS MYOVIEW.  Follow-Up: Your physician wants you to follow-up in: 6 months with Dr. Radford Pax. You will receive a reminder letter in the mail two months in advance. If you don't receive a letter, please call our office to schedule the follow-up appointment.   Any Other Special Instructions Will Be Listed Below (If Applicable).

## 2015-05-12 NOTE — Progress Notes (Signed)
Cardiology Office Note   Date:  05/12/2015   ID:  Maureen Keller, DOB [birth year], MRN [id number]  PCP:  Kandice Hams, MD    Chief Complaint  Patient presents with  . Follow-up    aortic stenosis      History of Present Illness: Maureen Keller is a 69 y.o. female with a history of HTN, moderate AR/moderate AS, moderate MR, mild pulmonary HTN, grade II diastolic dysfunction who presents today for followup. When I saw her last she was having some CP and I set her up for a nuclear stress test but she cancelled the study.  She says that now when she goes out to walk she will get a heaviness after walking a block but says she can continue and eventually it will resolve.  It happens very frequently when she walks.  She will get SOB with the CP.  It is midsternal with no radiation. She denies any  LE edema, dizziness, palpitations or syncope     Past Medical History  Diagnosis Date  . Mitral valve disorder     moderate MR on echo 07/2014  . Depression     PTSD  . Hyperlipidemia   . Aortic stenosis      moderate aortic insufficiency and moderate  aortic stenosis by echo 07/2014  . Hypothyroidism   . CHF (congestive heart failure)     /Diastolic dysfunction  . Pulmonary hypertension   . Glaucoma   . Chronic kidney disease (CKD), stage III (moderate)   . AR (aortic regurgitation)   . Hypertension     Past Surgical History  Procedure Laterality Date  . Cataract extraction    . Cardiac catheterization  11/04/2009    normal coronaries  . Carpal tunnel release  1982     Current Outpatient Prescriptions  Medication Sig Dispense Refill  . amLODipine (NORVASC) 2.5 MG tablet TAKE 1 TABLET DAILY. 30 tablet 6  . bimatoprost (LUMIGAN) 0.01 % SOLN 1 drop at bedtime.    . Calcium Carbonate-Vitamin D (CALCIUM + D PO) Take by mouth daily.    Marland Kitchen lamoTRIgine (LAMICTAL) 100 MG tablet Take 100 mg by mouth. 200 am, 100 afternoon, 300 pm    . latanoprost (XALATAN) 0.005 % ophthalmic  solution Place 1 drop into both eyes at bedtime.    Marland Kitchen levothyroxine (SYNTHROID, LEVOTHROID) 200 MCG tablet Take 200 mcg by mouth daily.    Marland Kitchen losartan (COZAAR) 100 MG tablet Take 100 mg by mouth daily.     . metoprolol succinate (TOPROL-XL) 25 MG 24 hr tablet TAKE 1 TABLET ONCE DAILY. 30 tablet 3  . Multiple Vitamin (MULTIVITAMIN) tablet Take 1 tablet by mouth daily.    . Red Yeast Rice 600 MG CAPS Take 2 capsules by mouth daily.    . traZODone (DESYREL) 100 MG tablet 300 mg daily.     No current facility-administered medications for this visit.    Allergies:   Risperdal    Social History:  The patient  reports that she quit smoking about 38 years ago. She has never used smokeless tobacco. She reports that she drinks alcohol. She reports that she does not use illicit drugs.   Family History:  The patient's family history includes Diabetes in her father and mother; Heart disease in her father; Hypertension in her father; Suicidality (age of onset: 31) in her mother.    ROS:  Please see the history of present illness.   Otherwise, review of systems are positive for  none.   All other systems are reviewed and negative.    PHYSICAL EXAM: VS:  BP 120/58 mmHg  Pulse 48  Ht 5\' 4"  (1.626 m)  Wt 160 lb 6.4 oz (72.757 kg)  BMI 27.52 kg/m2  SpO2 98% , BMI Body mass index is 27.52 kg/(m^2). GEN: Well nourished, well developed, in no acute distress HEENT: normal Neck: no JVD, carotid bruits, or masses Cardiac: RRR; no murmurs, rubs, or gallops,no edema  Respiratory:  clear to auscultation bilaterally, normal work of breathing GI: soft, nontender, nondistended, + BS MS: no deformity or atrophy Skin: warm and dry, no rash Neuro:  Strength and sensation are intact Psych: euthymic mood, full affect   EKG:  EKG was not ordered today    Recent Labs: 08/17/2014: BUN 11; Creatinine 1.2; Potassium 4.3; Sodium 139    Lipid Panel No results found for: CHOL, TRIG, HDL, CHOLHDL, VLDL, LDLCALC,  LDLDIRECT    Wt Readings from Last 3 Encounters:  05/12/15 160 lb 6.4 oz (72.757 kg)  11/08/14 157 lb (71.215 kg)  08/17/14 157 lb (71.215 kg)     ASSESSMENT AND PLAN:  1. Moderate AR - Repeat echo 01/2016 2. Moderate AS - Repeat echo 2/2017HTN - well controlled - continue metoprolol/amlodipine/Cozaar 3.  Diastolic dysfunction       4.  Chest pain - patient cancelled stress test ordered at last OV.  Given exertional CP with strong family history of CAD, I will set her up for a Stress myoview to rule out ischemia    Current medicines are reviewed at length with the patient today.  The patient does not have concerns regarding medicines.  The following changes have been made:  no change  Labs/ tests ordered today include: see above assessment and plan  Orders Placed This Encounter  Procedures  . Myocardial Perfusion Imaging     Disposition:   FU with me in 6 months   Signed, Sueanne Margarita, MD  05/12/2015 11:29 AM    [facility] [place], [place], [place]  [address] Phone: [phone]; Fax: [phone]

## 2015-05-17 DIAGNOSIS — Z961 Presence of intraocular lens: Secondary | ICD-10-CM | POA: Diagnosis not present

## 2015-05-17 DIAGNOSIS — H4011X2 Primary open-angle glaucoma, moderate stage: Secondary | ICD-10-CM | POA: Diagnosis not present

## 2015-05-26 ENCOUNTER — Telehealth (HOSPITAL_COMMUNITY): Payer: Self-pay | Admitting: *Deleted

## 2015-05-26 NOTE — Telephone Encounter (Signed)
Patient given detailed instructions per Myocardial Perfusion Study Information Sheet for test on 05/31/15  at 11:30. Patient Notified to arrive 15 minutes early, and that it is imperative to arrive on time for appointment to keep from having the test rescheduled. Patient verbalized understanding. Hubbard Robinson, RN

## 2015-05-31 ENCOUNTER — Ambulatory Visit (HOSPITAL_COMMUNITY): Payer: Medicare Other | Attending: Cardiology

## 2015-05-31 DIAGNOSIS — R079 Chest pain, unspecified: Secondary | ICD-10-CM | POA: Diagnosis not present

## 2015-05-31 LAB — MYOCARDIAL PERFUSION IMAGING
CHL CUP NUCLEAR SDS: 1
CHL CUP NUCLEAR SRS: 1
CHL CUP RESTING HR STRESS: 47 {beats}/min
LV dias vol: 105 mL
LV sys vol: 31 mL
Nuc Stress EF: 71 %
Peak HR: 81 {beats}/min
RATE: 0.28
SSS: 2
TID: 0.98

## 2015-05-31 MED ORDER — REGADENOSON 0.4 MG/5ML IV SOLN
0.4000 mg | Freq: Once | INTRAVENOUS | Status: AC
  Administered 2015-05-31: 0.4 mg via INTRAVENOUS

## 2015-05-31 MED ORDER — TECHNETIUM TC 99M SESTAMIBI GENERIC - CARDIOLITE
11.0000 | Freq: Once | INTRAVENOUS | Status: AC | PRN
  Administered 2015-05-31: 11 via INTRAVENOUS

## 2015-05-31 MED ORDER — TECHNETIUM TC 99M SESTAMIBI GENERIC - CARDIOLITE
33.0000 | Freq: Once | INTRAVENOUS | Status: AC | PRN
  Administered 2015-05-31: 33 via INTRAVENOUS

## 2015-06-01 ENCOUNTER — Telehealth: Payer: Self-pay

## 2015-06-01 DIAGNOSIS — R0602 Shortness of breath: Secondary | ICD-10-CM

## 2015-06-01 NOTE — Telephone Encounter (Signed)
Informed patient of Dr. Theodosia Blender recommendation for CPX.  Reviewed instructions for test and told patient to hold metoprolol the morning before. Patient agrees with treatment plan.

## 2015-06-01 NOTE — Telephone Encounter (Signed)
-----   Message from Sueanne Margarita, MD sent at 06/01/2015 10:44 AM EDT ----- Please set her up for a cardiopulmonary stress test

## 2015-06-08 ENCOUNTER — Ambulatory Visit (HOSPITAL_COMMUNITY): Payer: Medicare Other | Attending: Cardiology

## 2015-06-08 DIAGNOSIS — R0602 Shortness of breath: Secondary | ICD-10-CM | POA: Insufficient documentation

## 2015-06-09 ENCOUNTER — Other Ambulatory Visit (HOSPITAL_COMMUNITY): Payer: Self-pay | Admitting: *Deleted

## 2015-06-09 DIAGNOSIS — R0602 Shortness of breath: Secondary | ICD-10-CM

## 2015-06-13 DIAGNOSIS — M65312 Trigger thumb, left thumb: Secondary | ICD-10-CM | POA: Diagnosis not present

## 2015-06-14 DIAGNOSIS — M65312 Trigger thumb, left thumb: Secondary | ICD-10-CM | POA: Diagnosis not present

## 2015-06-22 DIAGNOSIS — M65312 Trigger thumb, left thumb: Secondary | ICD-10-CM | POA: Diagnosis not present

## 2015-06-29 ENCOUNTER — Telehealth: Payer: Self-pay | Admitting: Cardiology

## 2015-07-06 ENCOUNTER — Emergency Department (HOSPITAL_COMMUNITY)
Admission: EM | Admit: 2015-07-06 | Discharge: 2015-07-06 | Discharge status: Against Medical Advice | Payer: Medicare Other | Attending: Emergency Medicine | Admitting: Emergency Medicine

## 2015-07-06 ENCOUNTER — Encounter (HOSPITAL_COMMUNITY): Payer: Self-pay | Admitting: Emergency Medicine

## 2015-07-06 DIAGNOSIS — I509 Heart failure, unspecified: Secondary | ICD-10-CM | POA: Diagnosis not present

## 2015-07-06 DIAGNOSIS — N183 Chronic kidney disease, stage 3 (moderate): Secondary | ICD-10-CM | POA: Diagnosis not present

## 2015-07-06 DIAGNOSIS — I129 Hypertensive chronic kidney disease with stage 1 through stage 4 chronic kidney disease, or unspecified chronic kidney disease: Secondary | ICD-10-CM | POA: Diagnosis not present

## 2015-07-06 DIAGNOSIS — R21 Rash and other nonspecific skin eruption: Secondary | ICD-10-CM | POA: Diagnosis not present

## 2015-07-06 NOTE — ED Notes (Signed)
Pt from home c/o rash to body that started Thursday. Pt denies use of new products. She has been using cortisone cream without relief.

## 2015-07-06 NOTE — ED Notes (Signed)
Pt reported to registration she is leaving

## 2015-07-07 DIAGNOSIS — F3342 Major depressive disorder, recurrent, in full remission: Secondary | ICD-10-CM | POA: Diagnosis not present

## 2015-07-11 ENCOUNTER — Other Ambulatory Visit: Payer: Self-pay | Admitting: Cardiology

## 2015-07-11 ENCOUNTER — Other Ambulatory Visit: Payer: Self-pay

## 2015-07-11 MED ORDER — METOPROLOL SUCCINATE ER 25 MG PO TB24: 25.0000 mg | ORAL_TABLET | Freq: Every day | ORAL | Status: DC

## 2015-07-12 NOTE — Telephone Encounter (Signed)
Close Encounter 

## 2015-07-12 NOTE — Telephone Encounter (Deleted)
Close Encounter 

## 2015-07-19 DIAGNOSIS — I350 Nonrheumatic aortic (valve) stenosis: Secondary | ICD-10-CM | POA: Diagnosis not present

## 2015-07-19 DIAGNOSIS — I10 Essential (primary) hypertension: Secondary | ICD-10-CM | POA: Diagnosis not present

## 2015-07-19 DIAGNOSIS — F4310 Post-traumatic stress disorder, unspecified: Secondary | ICD-10-CM | POA: Diagnosis not present

## 2015-07-19 DIAGNOSIS — E785 Hyperlipidemia, unspecified: Secondary | ICD-10-CM | POA: Diagnosis not present

## 2015-07-19 DIAGNOSIS — R0600 Dyspnea, unspecified: Secondary | ICD-10-CM | POA: Diagnosis not present

## 2015-07-19 DIAGNOSIS — I270 Primary pulmonary hypertension: Secondary | ICD-10-CM | POA: Diagnosis not present

## 2015-07-19 DIAGNOSIS — E039 Hypothyroidism, unspecified: Secondary | ICD-10-CM | POA: Diagnosis not present

## 2015-07-26 DIAGNOSIS — R0600 Dyspnea, unspecified: Secondary | ICD-10-CM | POA: Diagnosis not present

## 2015-08-01 ENCOUNTER — Other Ambulatory Visit: Payer: Self-pay | Admitting: Cardiology

## 2015-08-23 DIAGNOSIS — E039 Hypothyroidism, unspecified: Secondary | ICD-10-CM | POA: Diagnosis not present

## 2015-09-07 ENCOUNTER — Other Ambulatory Visit: Payer: Self-pay

## 2015-09-07 DIAGNOSIS — Z1231 Encounter for screening mammogram for malignant neoplasm of breast: Secondary | ICD-10-CM

## 2015-09-13 ENCOUNTER — Ambulatory Visit
Admission: RE | Admit: 2015-09-13 | Discharge: 2015-09-13 | Disposition: A | Discharge status: Home / Self Care | Payer: Medicare Other | Source: Ambulatory Visit

## 2015-09-13 DIAGNOSIS — Z1231 Encounter for screening mammogram for malignant neoplasm of breast: Secondary | ICD-10-CM

## 2015-11-07 DIAGNOSIS — R0609 Other forms of dyspnea: Secondary | ICD-10-CM

## 2015-11-07 NOTE — Progress Notes (Signed)
Cardiology Office Note   Date:  11/08/2015   ID:  ARIYANNAH KATAYAMA, DOB [birth year], MRN [id number]  PCP:  Kandice Hams, MD    Chief Complaint  Patient presents with  . Shortness of Breath  . Aortic Stenosis      History of Present Illness: Maureen Keller is a 69 y.o. female with a history of HTN, moderate AR/moderate AS, moderate MR, mild pulmonary HTN, grade II diastolic dysfunction who presents today for followup. When I saw her last she was having some CP and would get SOB with the CP.Nuclear stress test showed no ischemia but due to persistent DOE, she underwent cardiopulmonary stress test which showed no circulatory or pulmonary limitation and she was set up for pulmonary rehab but did not go due to insurance not paying..   Since I saw her last, she has not has as much SOB.  She says that getting out of the humid weather has helped.  She says that sometimes when she walks she will get SOB and starts coughing.  She is able to walk through it.  She denies any chest pain or pressure, LE edema, dizziness, palpitations or syncope     Past Medical History  Diagnosis Date  . Mitral valve disorder     moderate MR on echo 07/2014  . Depression     PTSD  . Hyperlipidemia   . Aortic stenosis      moderate aortic insufficiency and moderate  aortic stenosis by echo 07/2014  . Hypothyroidism   . CHF (congestive heart failure) (HCC)     /Diastolic dysfunction  . Pulmonary hypertension ([place])   . Glaucoma   . Chronic kidney disease (CKD), stage III (moderate)   . AR (aortic regurgitation)   . Hypertension     Past Surgical History  Procedure Laterality Date  . Cataract extraction    . Cardiac catheterization  11/04/2009    normal coronaries  . Carpal tunnel release  1982     Current Outpatient Prescriptions  Medication Sig Dispense Refill  . amLODipine (NORVASC) 2.5 MG tablet Take 2.5 mg by mouth daily.    . bimatoprost (LUMIGAN) 0.01 % SOLN Place 1  drop into both eyes at bedtime.     . Calcium Carbonate-Vitamin D (CALCIUM + D PO) Take 1 tablet by mouth daily.     Marland Kitchen lamoTRIgine (LAMICTAL) 100 MG tablet Take 100 mg by mouth 3 (three) times daily.     Marland Kitchen latanoprost (XALATAN) 0.005 % ophthalmic solution Place 1 drop into both eyes at bedtime.    Marland Kitchen levothyroxine (SYNTHROID, LEVOTHROID) 200 MCG tablet Take 200 mcg by mouth daily.    Marland Kitchen losartan (COZAAR) 100 MG tablet Take 100 mg by mouth daily.     . metoprolol succinate (TOPROL-XL) 25 MG 24 hr tablet Take 1 tablet (25 mg total) by mouth daily. 30 tablet 11  . Multiple Vitamin (MULTIVITAMIN) tablet Take 1 tablet by mouth daily.    . Red Yeast Rice 600 MG CAPS Take 2 capsules by mouth daily.    . traZODone (DESYREL) 100 MG tablet Take 300 mg by mouth daily.      No current facility-administered medications for this visit.    Allergies:   Risperdal    Social History:  The patient  reports that she quit smoking about 39 years ago. She has never used smokeless tobacco. She reports that she drinks  alcohol. She reports that she does not use illicit drugs.   Family History:  The patient's family history includes Diabetes in her father and mother; Heart disease in her father; Hypertension in her father; Suicidality (age of onset: 52) in her mother.    ROS:  Please see the history of present illness.   Otherwise, review of systems are positive for none.   All other systems are reviewed and negative.    PHYSICAL EXAM: VS:  BP 122/80 mmHg  Pulse 59  Ht 5\' 5"  (1.651 m)  Wt 81.194 kg (179 lb)  BMI 29.79 kg/m2 , BMI Body mass index is 29.79 kg/(m^2). GEN: Well nourished, well developed, in no acute distress HEENT: normal Neck: no JVD, carotid bruits, or masses Cardiac: RRR; no murmurs, rubs, or gallops,no edema  Respiratory:  clear to auscultation bilaterally, normal work of breathing GI: soft, nontender, nondistended, + BS MS: no deformity or atrophy Skin: warm and dry, no rash Neuro:   Strength and sensation are intact Psych: euthymic mood, full affect   EKG:  EKG was ordered today and showed sinus bradycardia at 59bpm    Recent Labs: No results found for requested labs within last 365 days.    Lipid Panel No results found for: CHOL, TRIG, HDL, CHOLHDL, VLDL, LDLCALC, LDLDIRECT    Wt Readings from Last 3 Encounters:  11/08/15 81.194 kg (179 lb)  05/31/15 72.576 kg (160 lb)  05/12/15 72.757 kg (160 lb 6.4 oz)     ASSESSMENT AND PLAN:  1. Moderate AR - Repeat echo  2. Moderate AS - Repeat echo to make sure AS has not progressed although she did not appear to have any circulatory limitation on cardiopulmonary stress test. 3.  HTN - well controlled - continue metoprolol/amlodipine/Cozaar 4. Diastolic dysfunction  5. Chest pain -Stress myoview with no  Ischemia.       6.  DOE - pulmonary stress test with no circulatory or pulmonary limitation.  It does seem to be worse in humid weather and comes with a cough when she is walking so I wonder if she has exercised induced asthma.  I will recheck echo to assess for progression of AS and refer to pulmonary.    Current medicines are reviewed at length with the patient today.  The patient does not have concerns regarding medicines.  The following changes have been made:  no change  Labs/ tests ordered today: See above Assessment and Plan No orders of the defined types were placed in this encounter.     Disposition:   FU with me in 6 months  Signed, Sueanne Margarita, MD  11/08/2015 10:31 AM    [facility] [place], [place], [place]  [address] Phone: [phone]; Fax: [phone]

## 2015-11-08 ENCOUNTER — Ambulatory Visit (INDEPENDENT_AMBULATORY_CARE_PROVIDER_SITE_OTHER): Payer: Medicare Other | Admitting: Cardiology

## 2015-11-08 ENCOUNTER — Encounter: Payer: Self-pay | Admitting: Cardiology

## 2015-11-08 VITALS — BP 122/80 | HR 59 | Ht 65.0 in | Wt 179.0 lb

## 2015-11-08 DIAGNOSIS — I10 Essential (primary) hypertension: Secondary | ICD-10-CM | POA: Diagnosis not present

## 2015-11-08 DIAGNOSIS — I351 Nonrheumatic aortic (valve) insufficiency: Secondary | ICD-10-CM | POA: Diagnosis not present

## 2015-11-08 DIAGNOSIS — I059 Rheumatic mitral valve disease, unspecified: Secondary | ICD-10-CM | POA: Diagnosis not present

## 2015-11-08 DIAGNOSIS — I350 Nonrheumatic aortic (valve) stenosis: Secondary | ICD-10-CM

## 2015-11-08 DIAGNOSIS — R0609 Other forms of dyspnea: Secondary | ICD-10-CM

## 2015-11-08 NOTE — Patient Instructions (Signed)
Medication Instructions:  Your physician recommends that you continue on your current medications as directed. Please refer to the Current Medication list given to you today.   Labwork: None  Testing/Procedures: Your physician has requested that you have an echocardiogram. Echocardiography is a painless test that uses sound waves to create images of your heart. It provides your doctor with information about the size and shape of your heart and how well your heart's chambers and valves are working. This procedure takes approximately one hour. There are no restrictions for this procedure.  Follow-Up: You have been referred to [facility] for shortness of breath.  Your physician wants you to follow-up in: 6 months with Dr. Radford Pax. You will receive a reminder letter in the mail two months in advance. If you don't receive a letter, please call our office to schedule the follow-up appointment.   Any Other Special Instructions Will Be Listed Below (If Applicable).     If you need a refill on your cardiac medications before your next appointment, please call your pharmacy.

## 2015-11-14 ENCOUNTER — Other Ambulatory Visit: Payer: Self-pay | Admitting: Cardiology

## 2015-11-17 DIAGNOSIS — I350 Nonrheumatic aortic (valve) stenosis: Secondary | ICD-10-CM

## 2015-11-17 HISTORY — DX: Nonrheumatic aortic (valve) stenosis: I35.0

## 2015-11-21 ENCOUNTER — Other Ambulatory Visit (HOSPITAL_COMMUNITY): Payer: Medicare Other

## 2015-11-23 DIAGNOSIS — H401132 Primary open-angle glaucoma, bilateral, moderate stage: Secondary | ICD-10-CM | POA: Diagnosis not present

## 2015-11-25 ENCOUNTER — Other Ambulatory Visit: Payer: Self-pay

## 2015-11-25 ENCOUNTER — Ambulatory Visit (HOSPITAL_COMMUNITY): Payer: Medicare Other | Attending: Cardiology

## 2015-11-25 DIAGNOSIS — I340 Nonrheumatic mitral (valve) insufficiency: Secondary | ICD-10-CM | POA: Insufficient documentation

## 2015-11-25 DIAGNOSIS — E785 Hyperlipidemia, unspecified: Secondary | ICD-10-CM | POA: Insufficient documentation

## 2015-11-25 DIAGNOSIS — I351 Nonrheumatic aortic (valve) insufficiency: Secondary | ICD-10-CM

## 2015-11-25 DIAGNOSIS — I059 Rheumatic mitral valve disease, unspecified: Secondary | ICD-10-CM | POA: Diagnosis not present

## 2015-11-25 DIAGNOSIS — I350 Nonrheumatic aortic (valve) stenosis: Secondary | ICD-10-CM | POA: Diagnosis not present

## 2015-11-25 DIAGNOSIS — I10 Essential (primary) hypertension: Secondary | ICD-10-CM | POA: Diagnosis not present

## 2015-11-25 DIAGNOSIS — I352 Nonrheumatic aortic (valve) stenosis with insufficiency: Secondary | ICD-10-CM | POA: Diagnosis not present

## 2015-11-28 ENCOUNTER — Telehealth: Payer: Self-pay | Admitting: *Deleted

## 2015-11-28 ENCOUNTER — Encounter: Payer: Self-pay | Admitting: *Deleted

## 2015-11-28 DIAGNOSIS — Z01812 Encounter for preprocedural laboratory examination: Secondary | ICD-10-CM

## 2015-11-28 NOTE — Telephone Encounter (Signed)
-----   Message from Sueanne Margarita, MD sent at 11/27/2015  9:16 PM EST ----- Echo showed normal LVF with severe AS and moderate to severe AR and mild pulmonary HTN.  AS has progressed since last echo - given her occasional DOE - please set up for right and left heart cath with Dr. Angelena Form or Dr. Burt Knack

## 2015-11-28 NOTE — Telephone Encounter (Signed)
Spoke with pt and informed her of echo results. Advised pt that Dr. Radford Pax would like for her to have a R&L heart cath. Pt in agreement. Scheduled pt with Dr. Angelena Form on 11/30/15 at 1pm. Called pt and went over instructions. Scheduled labs for 12/13. Pt verbalized understanding and was in agreement with this plan.

## 2015-11-29 ENCOUNTER — Other Ambulatory Visit: Payer: Self-pay | Admitting: Cardiology

## 2015-11-29 ENCOUNTER — Other Ambulatory Visit (INDEPENDENT_AMBULATORY_CARE_PROVIDER_SITE_OTHER): Payer: Medicare Other | Admitting: *Deleted

## 2015-11-29 DIAGNOSIS — Z01812 Encounter for preprocedural laboratory examination: Secondary | ICD-10-CM | POA: Diagnosis not present

## 2015-11-29 DIAGNOSIS — I350 Nonrheumatic aortic (valve) stenosis: Secondary | ICD-10-CM

## 2015-11-29 DIAGNOSIS — I10 Essential (primary) hypertension: Secondary | ICD-10-CM | POA: Diagnosis not present

## 2015-11-29 LAB — CBC
HCT: 42.5 % (ref 36.0–46.0)
Hemoglobin: 14.7 g/dL (ref 12.0–15.0)
MCH: 31.7 pg (ref 26.0–34.0)
MCHC: 34.6 g/dL (ref 30.0–36.0)
MCV: 91.8 fL (ref 78.0–100.0)
MPV: 10.3 fL (ref 8.6–12.4)
PLATELETS: 252 10*3/uL (ref 150–400)
RBC: 4.63 MIL/uL (ref 3.87–5.11)
RDW: 13.1 % (ref 11.5–15.5)
WBC: 5.6 10*3/uL (ref 4.0–10.5)

## 2015-11-29 LAB — BASIC METABOLIC PANEL
BUN: 11 mg/dL (ref 7–25)
CHLORIDE: 107 mmol/L (ref 98–110)
CO2: 24 mmol/L (ref 20–31)
CREATININE: 0.91 mg/dL (ref 0.50–0.99)
Calcium: 9.4 mg/dL (ref 8.6–10.4)
GLUCOSE: 85 mg/dL (ref 65–99)
Potassium: 4.7 mmol/L (ref 3.5–5.3)
Sodium: 139 mmol/L (ref 135–146)

## 2015-11-30 ENCOUNTER — Ambulatory Visit (HOSPITAL_COMMUNITY)
Admission: RE | Admit: 2015-11-30 | Discharge: 2015-11-30 | Disposition: A | Discharge status: Home / Self Care | Payer: Medicare Other | Source: Ambulatory Visit | Attending: Cardiovascular Disease | Admitting: Cardiovascular Disease

## 2015-11-30 ENCOUNTER — Encounter (HOSPITAL_COMMUNITY)
Admission: RE | Disposition: A | Discharge status: Home / Self Care | Payer: Self-pay | Source: Ambulatory Visit | Attending: Cardiovascular Disease

## 2015-11-30 DIAGNOSIS — Z87891 Personal history of nicotine dependence: Secondary | ICD-10-CM | POA: Insufficient documentation

## 2015-11-30 DIAGNOSIS — E039 Hypothyroidism, unspecified: Secondary | ICD-10-CM | POA: Insufficient documentation

## 2015-11-30 DIAGNOSIS — N183 Chronic kidney disease, stage 3 (moderate): Secondary | ICD-10-CM | POA: Diagnosis not present

## 2015-11-30 DIAGNOSIS — I5032 Chronic diastolic (congestive) heart failure: Secondary | ICD-10-CM | POA: Diagnosis not present

## 2015-11-30 DIAGNOSIS — F329 Major depressive disorder, single episode, unspecified: Secondary | ICD-10-CM | POA: Diagnosis not present

## 2015-11-30 DIAGNOSIS — F4310 Post-traumatic stress disorder, unspecified: Secondary | ICD-10-CM | POA: Diagnosis not present

## 2015-11-30 DIAGNOSIS — I272 Other secondary pulmonary hypertension: Secondary | ICD-10-CM | POA: Diagnosis not present

## 2015-11-30 DIAGNOSIS — I130 Hypertensive heart and chronic kidney disease with heart failure and stage 1 through stage 4 chronic kidney disease, or unspecified chronic kidney disease: Secondary | ICD-10-CM | POA: Insufficient documentation

## 2015-11-30 DIAGNOSIS — E785 Hyperlipidemia, unspecified: Secondary | ICD-10-CM | POA: Diagnosis not present

## 2015-11-30 DIAGNOSIS — I350 Nonrheumatic aortic (valve) stenosis: Secondary | ICD-10-CM | POA: Diagnosis not present

## 2015-11-30 DIAGNOSIS — Z8249 Family history of ischemic heart disease and other diseases of the circulatory system: Secondary | ICD-10-CM | POA: Insufficient documentation

## 2015-11-30 DIAGNOSIS — I080 Rheumatic disorders of both mitral and aortic valves: Secondary | ICD-10-CM | POA: Insufficient documentation

## 2015-11-30 HISTORY — PX: CARDIAC CATHETERIZATION: SHX172

## 2015-11-30 LAB — POCT I-STAT 3, ART BLOOD GAS (G3+)
ACID-BASE DEFICIT: 1 mmol/L (ref 0.0–2.0)
Bicarbonate: 24.2 mEq/L — ABNORMAL HIGH (ref 20.0–24.0)
O2 Saturation: 94 %
PCO2 ART: 43.3 mmHg (ref 35.0–45.0)
PH ART: 7.354 (ref 7.350–7.450)
TCO2: 25 mmol/L (ref 0–100)
pO2, Arterial: 75 mmHg — ABNORMAL LOW (ref 80.0–100.0)

## 2015-11-30 LAB — POCT ACTIVATED CLOTTING TIME
ACTIVATED CLOTTING TIME: 219 s
ACTIVATED CLOTTING TIME: 173 s

## 2015-11-30 LAB — POCT I-STAT 3, VENOUS BLOOD GAS (G3P V)
ACID-BASE DEFICIT: 3 mmol/L — AB (ref 0.0–2.0)
BICARBONATE: 23 meq/L (ref 20.0–24.0)
O2 SAT: 68 %
PCO2 VEN: 42.8 mmHg — AB (ref 45.0–50.0)
PH VEN: 7.338 — AB (ref 7.250–7.300)
PO2 VEN: 38 mmHg (ref 30.0–45.0)
TCO2: 24 mmol/L (ref 0–100)

## 2015-11-30 LAB — PROTIME-INR
INR: 0.96 (ref <1.50)
Prothrombin Time: 12.9 seconds (ref 11.6–15.2)

## 2015-11-30 SURGERY — RIGHT/LEFT HEART CATH AND CORONARY ANGIOGRAPHY

## 2015-11-30 MED ORDER — SODIUM CHLORIDE 0.9 % WEIGHT BASED INFUSION
3.0000 mL/kg/h | INTRAVENOUS | Status: DC
  Administered 2015-11-30: 3 mL/kg/h via INTRAVENOUS

## 2015-11-30 MED ORDER — SODIUM CHLORIDE 0.9 % IJ SOLN: 3.0000 mL | Freq: Two times a day (BID) | INTRAMUSCULAR | Status: DC

## 2015-11-30 MED ORDER — FENTANYL CITRATE (PF) 100 MCG/2ML IJ SOLN
INTRAMUSCULAR | Status: DC | PRN
  Administered 2015-11-30 (×2): 25 ug via INTRAVENOUS

## 2015-11-30 MED ORDER — HEPARIN (PORCINE) IN NACL 2-0.9 UNIT/ML-% IJ SOLN
INTRAMUSCULAR | Status: AC
  Filled 2015-11-30: qty 1000

## 2015-11-30 MED ORDER — HEPARIN SODIUM (PORCINE) 1000 UNIT/ML IJ SOLN
INTRAMUSCULAR | Status: AC
  Filled 2015-11-30: qty 1

## 2015-11-30 MED ORDER — FENTANYL CITRATE (PF) 100 MCG/2ML IJ SOLN
INTRAMUSCULAR | Status: AC
  Filled 2015-11-30: qty 2

## 2015-11-30 MED ORDER — HYDRALAZINE HCL 20 MG/ML IJ SOLN
INTRAMUSCULAR | Status: AC
  Filled 2015-11-30: qty 1

## 2015-11-30 MED ORDER — ASPIRIN 81 MG PO CHEW
CHEWABLE_TABLET | ORAL | Status: AC
  Administered 2015-11-30: 81 mg via ORAL
  Filled 2015-11-30: qty 1

## 2015-11-30 MED ORDER — HEPARIN (PORCINE) IN NACL 2-0.9 UNIT/ML-% IJ SOLN
INTRAMUSCULAR | Status: AC
  Filled 2015-11-30: qty 500

## 2015-11-30 MED ORDER — MIDAZOLAM HCL 2 MG/2ML IJ SOLN
INTRAMUSCULAR | Status: DC | PRN
  Administered 2015-11-30 (×3): 1 mg via INTRAVENOUS

## 2015-11-30 MED ORDER — SODIUM CHLORIDE 0.9 % IV SOLN
INTRAVENOUS | Status: AC
Start: 2015-11-30 — End: 2015-11-30

## 2015-11-30 MED ORDER — HEPARIN SODIUM (PORCINE) 1000 UNIT/ML IJ SOLN
INTRAMUSCULAR | Status: DC | PRN
  Administered 2015-11-30: 4000 [IU] via INTRAVENOUS

## 2015-11-30 MED ORDER — IOHEXOL 350 MG/ML SOLN
INTRAVENOUS | Status: DC | PRN
  Administered 2015-11-30: 50 mL via INTRA_ARTERIAL

## 2015-11-30 MED ORDER — HYDRALAZINE HCL 20 MG/ML IJ SOLN: 10.0000 mg | Freq: Once | INTRAMUSCULAR | Status: DC

## 2015-11-30 MED ORDER — VERAPAMIL HCL 2.5 MG/ML IV SOLN
INTRAVENOUS | Status: DC | PRN
  Administered 2015-11-30: 8 mL via INTRA_ARTERIAL

## 2015-11-30 MED ORDER — LIDOCAINE HCL (PF) 1 % IJ SOLN
INTRAMUSCULAR | Status: DC | PRN
  Administered 2015-11-30: 25 mL

## 2015-11-30 MED ORDER — SODIUM CHLORIDE 0.9 % IV SOLN: 250.0000 mL | INTRAVENOUS | Status: DC | PRN

## 2015-11-30 MED ORDER — SODIUM CHLORIDE 0.9 % WEIGHT BASED INFUSION: 1.0000 mL/kg/h | INTRAVENOUS | Status: DC

## 2015-11-30 MED ORDER — MIDAZOLAM HCL 2 MG/2ML IJ SOLN
INTRAMUSCULAR | Status: AC
  Filled 2015-11-30: qty 2

## 2015-11-30 MED ORDER — VERAPAMIL HCL 2.5 MG/ML IV SOLN
INTRAVENOUS | Status: AC
Start: 2015-11-30 — End: 2015-11-30
  Filled 2015-11-30: qty 2

## 2015-11-30 MED ORDER — SODIUM CHLORIDE 0.9 % IJ SOLN: 3.0000 mL | INTRAMUSCULAR | Status: DC | PRN

## 2015-11-30 MED ORDER — ASPIRIN 81 MG PO CHEW
81.0000 mg | CHEWABLE_TABLET | ORAL | Status: AC
  Administered 2015-11-30: 81 mg via ORAL

## 2015-11-30 MED ORDER — LIDOCAINE HCL (PF) 1 % IJ SOLN
INTRAMUSCULAR | Status: AC
  Filled 2015-11-30: qty 30

## 2015-11-30 SURGICAL SUPPLY — 16 items
CATH INFINITI 5 FR JL3.5 (CATHETERS) ×2 IMPLANT
CATH INFINITI 5FR AL1 (CATHETERS) ×1 IMPLANT
CATH INFINITI 5FR ANG PIGTAIL (CATHETERS) ×2 IMPLANT
CATH INFINITI JR4 5F (CATHETERS) ×2 IMPLANT
CATH SWAN GANZ 7F STRAIGHT (CATHETERS) ×1 IMPLANT
DEVICE RAD COMP TR BAND LRG (VASCULAR PRODUCTS) ×2 IMPLANT
GLIDESHEATH SLEND SS 6F .021 (SHEATH) ×2 IMPLANT
KIT HEART LEFT (KITS) ×2 IMPLANT
KIT HEART RIGHT NAMIC (KITS) ×2 IMPLANT
PACK CARDIAC CATHETERIZATION (CUSTOM PROCEDURE TRAY) ×2 IMPLANT
SHEATH PINNACLE 7F 10CM (SHEATH) ×1 IMPLANT
SYR MEDRAD MARK V 150ML (SYRINGE) ×2 IMPLANT
TRANSDUCER W/STOPCOCK (MISCELLANEOUS) ×4 IMPLANT
TUBING CIL FLEX 10 FLL-RA (TUBING) ×2 IMPLANT
WIRE EMERALD ST .035X260CM (WIRE) ×1 IMPLANT
WIRE SAFE-T 1.5MM-J .035X260CM (WIRE) ×2 IMPLANT

## 2015-11-30 NOTE — Interval H&P Note (Signed)
History and Physical Interval Note:  11/30/2015 2:33 PM  [name]  has presented today for cardiac cath with the diagnosis of severe AS.  The various methods of treatment have been discussed with the patient and family. After consideration of risks, benefits and other options for treatment, the patient has consented to  Procedure(s): Right/Left Heart Cath and Coronary Angiography (N/A) as a surgical intervention .  The patient's history has been reviewed, patient examined, no change in status, stable for surgery.  I have reviewed the patient's chart and labs.  Questions were answered to the patient's satisfaction.     MCALHANY,CHRISTOPHER

## 2015-11-30 NOTE — Research (Signed)
[facility] Study Informed Consent   Subject Name: Maureen Keller  Subject met inclusion and exclusion criteria.  The informed consent form, study requirements and expectations were reviewed with the subject and questions and concerns were addressed prior to the signing of the consent form.  The subject verbalized understanding of the trial requirements.  The subject agreed to participate in the [facility] trial and signed the informed consent on 11/30/15 at 1204.  The informed consent was obtained prior to performance of any protocol-specific procedures for the subject.  A copy of the signed informed consent was given to the subject and a copy was placed in the subject's medical record.  Blossom Hoops 11/30/2015, 1:56 PM

## 2015-11-30 NOTE — H&P (View-Only) (Signed)
Cardiology Office Note   Date:  11/08/2015   ID:  Maureen Keller, DOB Feb 24, 1946, MRN [id number]  PCP:  Kandice Hams, MD    Chief Complaint  Patient presents with  . Shortness of Breath  . Aortic Stenosis      History of Present Illness: Maureen Keller is a 69 y.o. female with a history of HTN, moderate AR/moderate AS, moderate MR, mild pulmonary HTN, grade II diastolic dysfunction who presents today for followup. When I saw her last she was having some CP and would get SOB with the CP.Nuclear stress test showed no ischemia but due to persistent DOE, she underwent cardiopulmonary stress test which showed no circulatory or pulmonary limitation and she was set up for pulmonary rehab but did not go due to insurance not paying..   Since I saw her last, she has not has as much SOB.  She says that getting out of the humid weather has helped.  She says that sometimes when she walks she will get SOB and starts coughing.  She is able to walk through it.  She denies any chest pain or pressure, LE edema, dizziness, palpitations or syncope     Past Medical History  Diagnosis Date  . Mitral valve disorder     moderate MR on echo 07/2014  . Depression     PTSD  . Hyperlipidemia   . Aortic stenosis      moderate aortic insufficiency and moderate  aortic stenosis by echo 07/2014  . Hypothyroidism   . CHF (congestive heart failure) (HCC)     /Diastolic dysfunction  . Pulmonary hypertension ([place])   . Glaucoma   . Chronic kidney disease (CKD), stage III (moderate)   . AR (aortic regurgitation)   . Hypertension     Past Surgical History  Procedure Laterality Date  . Cataract extraction    . Cardiac catheterization  11/04/2009    normal coronaries  . Carpal tunnel release  1982     Current Outpatient Prescriptions  Medication Sig Dispense Refill  . amLODipine (NORVASC) 2.5 MG tablet Take 2.5 mg by mouth daily.    . bimatoprost (LUMIGAN) 0.01 % SOLN Place 1  drop into both eyes at bedtime.     . Calcium Carbonate-Vitamin D (CALCIUM + D PO) Take 1 tablet by mouth daily.     Marland Kitchen lamoTRIgine (LAMICTAL) 100 MG tablet Take 100 mg by mouth 3 (three) times daily.     Marland Kitchen latanoprost (XALATAN) 0.005 % ophthalmic solution Place 1 drop into both eyes at bedtime.    Marland Kitchen levothyroxine (SYNTHROID, LEVOTHROID) 200 MCG tablet Take 200 mcg by mouth daily.    Marland Kitchen losartan (COZAAR) 100 MG tablet Take 100 mg by mouth daily.     . metoprolol succinate (TOPROL-XL) 25 MG 24 hr tablet Take 1 tablet (25 mg total) by mouth daily. 30 tablet 11  . Multiple Vitamin (MULTIVITAMIN) tablet Take 1 tablet by mouth daily.    . Red Yeast Rice 600 MG CAPS Take 2 capsules by mouth daily.    . traZODone (DESYREL) 100 MG tablet Take 300 mg by mouth daily.      No current facility-administered medications for this visit.    Allergies:   Risperdal    Social History:  The patient  reports that she quit smoking about 39 years ago. She has never used smokeless tobacco. She reports that she drinks  alcohol. She reports that she does not use illicit drugs.   Family History:  The patient's family history includes Diabetes in her father and mother; Heart disease in her father; Hypertension in her father; Suicidality (age of onset: 61) in her mother.    ROS:  Please see the history of present illness.   Otherwise, review of systems are positive for none.   All other systems are reviewed and negative.    PHYSICAL EXAM: VS:  BP 122/80 mmHg  Pulse 59  Ht 5\' 5"  (1.651 m)  Wt 81.194 kg (179 lb)  BMI 29.79 kg/m2 , BMI Body mass index is 29.79 kg/(m^2). GEN: Well nourished, well developed, in no acute distress HEENT: normal Neck: no JVD, carotid bruits, or masses Cardiac: RRR; no murmurs, rubs, or gallops,no edema  Respiratory:  clear to auscultation bilaterally, normal work of breathing GI: soft, nontender, nondistended, + BS MS: no deformity or atrophy Skin: warm and dry, no rash Neuro:   Strength and sensation are intact Psych: euthymic mood, full affect   EKG:  EKG was ordered today and showed sinus bradycardia at 59bpm    Recent Labs: No results found for requested labs within last 365 days.    Lipid Panel No results found for: CHOL, TRIG, HDL, CHOLHDL, VLDL, LDLCALC, LDLDIRECT    Wt Readings from Last 3 Encounters:  11/08/15 81.194 kg (179 lb)  05/31/15 72.576 kg (160 lb)  05/12/15 72.757 kg (160 lb 6.4 oz)     ASSESSMENT AND PLAN:  1. Moderate AR - Repeat echo  2. Moderate AS - Repeat echo to make sure AS has not progressed although she did not appear to have any circulatory limitation on cardiopulmonary stress test. 3.  HTN - well controlled - continue metoprolol/amlodipine/Cozaar 4. Diastolic dysfunction  5. Chest pain -Stress myoview with no  Ischemia.       6.  DOE - pulmonary stress test with no circulatory or pulmonary limitation.  It does seem to be worse in humid weather and comes with a cough when she is walking so I wonder if she has exercised induced asthma.  I will recheck echo to assess for progression of AS and refer to pulmonary.    Current medicines are reviewed at length with the patient today.  The patient does not have concerns regarding medicines.  The following changes have been made:  no change  Labs/ tests ordered today: See above Assessment and Plan No orders of the defined types were placed in this encounter.     Disposition:   FU with me in 6 months  Signed, Sueanne Margarita, MD  11/08/2015 10:31 AM    [facility] [place], [place], [place]  [address] Phone: [phone]; Fax: [phone]

## 2015-11-30 NOTE — Progress Notes (Signed)
Site area: right groin fv sheath Site Prior to Removal:  Level 0 Pressure Applied For:  15 minutes Manual:   yes Patient Status During Pull:  stable Post Pull Site:  Level  0 Post Pull Instructions Given:  yes Post Pull Pulses Present: yes Dressing Applied:  Small tegaderm Bedrest begins @ [id number] Comments:

## 2015-11-30 NOTE — Discharge Instructions (Signed)
Radial Site Care °Refer to this sheet in the next few weeks. These instructions provide you with information about caring for yourself after your procedure. Your health care provider may also give you more specific instructions. Your treatment has been planned according to current medical practices, but problems sometimes occur. Call your health care provider if you have any problems or questions after your procedure. °WHAT TO EXPECT AFTER THE PROCEDURE °After your procedure, it is typical to have the following: °· Bruising at the radial site that usually fades within 1-2 weeks. °· Blood collecting in the tissue (hematoma) that may be painful to the touch. It should usually decrease in size and tenderness within 1-2 weeks. °HOME CARE INSTRUCTIONS °· Take medicines only as directed by your health care provider. °· You may shower 24-48 hours after the procedure or as directed by your health care provider. Remove the bandage (dressing) and gently wash the site with plain soap and water. Pat the area dry with a clean towel. Do not rub the site, because this may cause bleeding. °· Do not take baths, swim, or use a hot tub until your health care provider approves. °· Check your insertion site every day for redness, swelling, or drainage. °· Do not apply powder or lotion to the site. °· Do not flex or bend the affected arm for 24 hours or as directed by your health care provider. °· Do not push or pull heavy objects with the affected arm for 24 hours or as directed by your health care provider. °· Do not lift over 10 lb (4.5 kg) for 5 days after your procedure or as directed by your health care provider. °· Ask your health care provider when it is okay to: °¨ Return to work or school. °¨ Resume usual physical activities or sports. °¨ Resume sexual activity. °· Do not drive home if you are discharged the same day as the procedure. Have someone else drive you. °· You may drive 24 hours after the procedure unless otherwise  instructed by your health care provider. °· Do not operate machinery or power tools for 24 hours after the procedure. °· If your procedure was done as an outpatient procedure, which means that you went home the same day as your procedure, a responsible adult should be with you for the first 24 hours after you arrive home. °· Keep all follow-up visits as directed by your health care provider. This is important. °SEEK MEDICAL CARE IF: °· You have a fever. °· You have chills. °· You have increased bleeding from the radial site. Hold pressure on the site. °SEEK IMMEDIATE MEDICAL CARE IF: °· You have unusual pain at the radial site. °· You have redness, warmth, or swelling at the radial site. °· You have drainage (other than a small amount of blood on the dressing) from the radial site. °· The radial site is bleeding, and the bleeding does not stop after 30 minutes of holding steady pressure on the site. °· Your arm or hand becomes pale, cool, tingly, or numb. °  °This information is not intended to replace advice given to you by your health care provider. Make sure you discuss any questions you have with your health care provider. °  °Document Released: 01/05/2011 Document Revised: 12/24/2014 Document Reviewed: 06/21/2014 °Elsevier Interactive Patient Education ©2016 [employer] °Angiogram, Care After °These instructions give you information about caring for yourself after your procedure. Your doctor may also give you more specific instructions. Call your doctor if you   have any problems or questions after your procedure.  °HOME CARE °· Take medicines only as told by your doctor. °· Follow your doctor's instructions about: °· Care of the area where the tube was inserted. °· Bandage (dressing) changes and removal. °· You may shower 24-48 hours after the procedure or as told by your doctor. °· Do not take baths, swim, or use a hot tub until your doctor approves. °· Every day, check the area where the tube was inserted.  Watch for: °· Redness, swelling, or pain. °· Fluid, blood, or pus. °· Do not apply powder or lotion to the site. °· Do not lift anything that is heavier than 10 lb (4.5 kg) for 5 days or as told by your doctor. °· Ask your doctor when you can: °· Return to work or school. °· Do physical activities or play sports. °· Have sex. °· Do not drive or operate heavy machinery for 24 hours or as told by your doctor. °· Have someone with you for the first 24 hours after the procedure. °· Keep all follow-up visits as told by your doctor. This is important. °GET HELP IF: °· You have a fever.   °· You have chills.   °· You have more bleeding from the area where the tube was inserted. Hold pressure on the area. °· You have redness, swelling, or pain in the area where the tube was inserted. °· You have fluid or pus coming from the area. °GET HELP RIGHT AWAY IF:  °· You have a lot of pain in the area where the tube was inserted. °· The area where the tube was inserted is bleeding, and the bleeding does not stop after 30 minutes of holding steady pressure on the area. °· The area near or just beyond the insertion site becomes pale, cool, tingly, or numb. °  °This information is not intended to replace advice given to you by your health care provider. Make sure you discuss any questions you have with your health care provider. °  °Document Released: 03/01/2009 Document Revised: 12/24/2014 Document Reviewed: 05/06/2013 °Elsevier Interactive Patient Education ©2016 [employer]vier Inc[employer] ° °

## 2015-11-30 NOTE — Pre-Procedure Instructions (Signed)
Pts BP has been [id number] systolic. Dr. Julianne Handler notified. Order received to give hydralazine for BP over 180.  Upon taking med in room, Pts SBP has been less than 160.  Hydralazine held.

## 2015-12-01 ENCOUNTER — Encounter (HOSPITAL_COMMUNITY): Payer: Self-pay | Admitting: Cardiovascular Disease

## 2015-12-05 ENCOUNTER — Institutional Professional Consult (permissible substitution): Payer: Medicare Other | Admitting: Pulmonary Disease

## 2015-12-06 ENCOUNTER — Encounter: Payer: Medicare Other | Admitting: Surgery

## 2015-12-11 ENCOUNTER — Emergency Department (HOSPITAL_COMMUNITY): Payer: Medicare Other

## 2015-12-11 ENCOUNTER — Encounter (HOSPITAL_COMMUNITY): Payer: Self-pay | Admitting: *Deleted

## 2015-12-11 ENCOUNTER — Emergency Department (HOSPITAL_COMMUNITY)
Admission: EM | Admit: 2015-12-11 | Discharge: 2015-12-11 | Disposition: A | Discharge status: Home / Self Care | Payer: Medicare Other | Attending: Emergency Medicine | Admitting: Emergency Medicine

## 2015-12-11 DIAGNOSIS — E039 Hypothyroidism, unspecified: Secondary | ICD-10-CM | POA: Insufficient documentation

## 2015-12-11 DIAGNOSIS — R0602 Shortness of breath: Secondary | ICD-10-CM | POA: Diagnosis present

## 2015-12-11 DIAGNOSIS — R011 Cardiac murmur, unspecified: Secondary | ICD-10-CM | POA: Insufficient documentation

## 2015-12-11 DIAGNOSIS — Z9889 Other specified postprocedural states: Secondary | ICD-10-CM | POA: Diagnosis not present

## 2015-12-11 DIAGNOSIS — Z9849 Cataract extraction status, unspecified eye: Secondary | ICD-10-CM | POA: Diagnosis not present

## 2015-12-11 DIAGNOSIS — I509 Heart failure, unspecified: Secondary | ICD-10-CM | POA: Insufficient documentation

## 2015-12-11 DIAGNOSIS — N183 Chronic kidney disease, stage 3 (moderate): Secondary | ICD-10-CM | POA: Insufficient documentation

## 2015-12-11 DIAGNOSIS — F4310 Post-traumatic stress disorder, unspecified: Secondary | ICD-10-CM | POA: Diagnosis not present

## 2015-12-11 DIAGNOSIS — Z8669 Personal history of other diseases of the nervous system and sense organs: Secondary | ICD-10-CM | POA: Diagnosis not present

## 2015-12-11 DIAGNOSIS — R0600 Dyspnea, unspecified: Secondary | ICD-10-CM | POA: Diagnosis not present

## 2015-12-11 DIAGNOSIS — E785 Hyperlipidemia, unspecified: Secondary | ICD-10-CM | POA: Insufficient documentation

## 2015-12-11 DIAGNOSIS — Z87891 Personal history of nicotine dependence: Secondary | ICD-10-CM | POA: Insufficient documentation

## 2015-12-11 DIAGNOSIS — I129 Hypertensive chronic kidney disease with stage 1 through stage 4 chronic kidney disease, or unspecified chronic kidney disease: Secondary | ICD-10-CM | POA: Diagnosis not present

## 2015-12-11 DIAGNOSIS — I350 Nonrheumatic aortic (valve) stenosis: Secondary | ICD-10-CM | POA: Diagnosis not present

## 2015-12-11 DIAGNOSIS — R739 Hyperglycemia, unspecified: Secondary | ICD-10-CM | POA: Diagnosis not present

## 2015-12-11 DIAGNOSIS — Z79899 Other long term (current) drug therapy: Secondary | ICD-10-CM | POA: Insufficient documentation

## 2015-12-11 DIAGNOSIS — F329 Major depressive disorder, single episode, unspecified: Secondary | ICD-10-CM | POA: Diagnosis not present

## 2015-12-11 DIAGNOSIS — R05 Cough: Secondary | ICD-10-CM | POA: Diagnosis not present

## 2015-12-11 LAB — COMPREHENSIVE METABOLIC PANEL
ALT: 23 U/L (ref 14–54)
ANION GAP: 7 (ref 5–15)
AST: 30 U/L (ref 15–41)
Albumin: 3.6 g/dL (ref 3.5–5.0)
Alkaline Phosphatase: 73 U/L (ref 38–126)
BUN: 14 mg/dL (ref 6–20)
CALCIUM: 9.9 mg/dL (ref 8.9–10.3)
CHLORIDE: 108 mmol/L (ref 101–111)
CO2: 24 mmol/L (ref 22–32)
CREATININE: 1.06 mg/dL — AB (ref 0.44–1.00)
GFR, EST NON AFRICAN AMERICAN: 52 mL/min — AB (ref >60)
Glucose, Bld: 139 mg/dL — ABNORMAL HIGH (ref 65–99)
Potassium: 4.1 mmol/L (ref 3.5–5.1)
SODIUM: 139 mmol/L (ref 135–145)
Total Bilirubin: 0.7 mg/dL (ref 0.3–1.2)
Total Protein: 6.4 g/dL — ABNORMAL LOW (ref 6.5–8.1)

## 2015-12-11 LAB — BRAIN NATRIURETIC PEPTIDE: B NATRIURETIC PEPTIDE 5: 82.4 pg/mL (ref 0.0–100.0)

## 2015-12-11 LAB — CBC
HCT: 44.1 % (ref 36.0–46.0)
Hemoglobin: 15.5 g/dL — ABNORMAL HIGH (ref 12.0–15.0)
MCH: 32.6 pg (ref 26.0–34.0)
MCHC: 35.1 g/dL (ref 30.0–36.0)
MCV: 92.6 fL (ref 78.0–100.0)
PLATELETS: 269 10*3/uL (ref 150–400)
RBC: 4.76 MIL/uL (ref 3.87–5.11)
RDW: 12.9 % (ref 11.5–15.5)
WBC: 5.2 10*3/uL (ref 4.0–10.5)

## 2015-12-11 LAB — I-STAT TROPONIN, ED: TROPONIN I, POC: 0.01 ng/mL (ref 0.00–0.08)

## 2015-12-11 MED ORDER — FUROSEMIDE 10 MG/ML IJ SOLN
20.0000 mg | Freq: Once | INTRAMUSCULAR | Status: AC
  Administered 2015-12-11: 20 mg via INTRAVENOUS
  Filled 2015-12-11: qty 2

## 2015-12-11 NOTE — ED Notes (Signed)
Pt states she had a cardiac cath last week to find out why she has been experiencing sob. She was told she needed an aoritc valve replacement in one months time.  Since the cath she's been having increasing sob and she feels that whatever happened during the cath has "aggitated" the sob to the fact that she can't move about the house without being sob.

## 2015-12-11 NOTE — ED Provider Notes (Signed)
CSN: [id number]     Arrival date & time 12/11/15  1043 History   First MD Initiated Contact with Patient 12/11/15 1243     Chief Complaint  Patient presents with  . Shortness of Breath     (Consider location/radiation/quality/duration/timing/severity/associated sxs/prior Treatment) HPI Complains of shortness of breath progressively worsening over the past 1 year. Patient reports that as of 2 weeks ago she could walk 2 miles with mild dyspnea for the past 2 or 3 days she has dyspnea with walking across her home. She denies any chest pain. No treatment prior to coming here. Patient had cardiac catheterization 11/30/2015 for evaluation for severe aortic stenosis. She has scheduled appointment with Dr. Roxan Hockey from thoracic surgery service for 12/14/2015 shortness of breath is worse with exertion and improved with rest . No other associated symptoms. No treatment prior to coming here Past Medical History  Diagnosis Date  . Mitral valve disorder     moderate MR on echo 07/2014  . Depression     PTSD  . Hyperlipidemia   . Aortic stenosis      moderate aortic insufficiency and moderate  aortic stenosis by echo 07/2014  . Hypothyroidism   . CHF (congestive heart failure) (HCC)     /Diastolic dysfunction  . Pulmonary hypertension ([place])   . Glaucoma   . Chronic kidney disease (CKD), stage III (moderate)   . AR (aortic regurgitation)   . Hypertension    Past Surgical History  Procedure Laterality Date  . Cataract extraction    . Cardiac catheterization  11/04/2009    normal coronaries  . Carpal tunnel release  1982  . Cardiac catheterization N/A 11/30/2015    Procedure: Right/Left Heart Cath and Coronary Angiography;  Surgeon: Burnell Blanks, MD;  Location: [facility];  Service: Cardiovascular;  Laterality: N/A;   Family History  Problem Relation Age of Onset  . Hypertension Father   . Heart disease Father   . Diabetes Father   . Diabetes Mother   . Suicidality  Mother 24   Social History  Substance Use Topics  . Smoking status: Former Smoker    Quit date: 10/29/1976  . Smokeless tobacco: Never Used  . Alcohol Use: Yes     Comment: Occ   OB History    No data available     Review of Systems  Constitutional: Negative.   HENT: Negative.   Respiratory: Positive for shortness of breath.   Cardiovascular: Negative.   Gastrointestinal: Negative.   Musculoskeletal: Negative.   Skin: Negative.   Neurological: Negative.   Psychiatric/Behavioral: Negative.   All other systems reviewed and are negative.     Allergies  Risperdal  Home Medications   Prior to Admission medications   Medication Sig Start Date End Date Taking? Authorizing Provider  amLODipine (NORVASC) 2.5 MG tablet TAKE 1 TABLET DAILY. 11/15/15   Sueanne Margarita, MD  lamoTRIgine (LAMICTAL) 100 MG tablet Take 100-200 mg by mouth 2 (two) times daily. 200mg  in the morning and 100mg  in the evening 09/15/13   Historical Provider, MD  latanoprost (XALATAN) 0.005 % ophthalmic solution Place 1 drop into both eyes at bedtime. 04/18/15   Historical Provider, MD  levothyroxine (SYNTHROID, LEVOTHROID) 200 MCG tablet Take 200 mcg by mouth daily. 05/09/15   Historical Provider, MD  losartan (COZAAR) 100 MG tablet Take 100 mg by mouth daily.  10/05/13   Historical Provider, MD  metoprolol succinate (TOPROL-XL) 25 MG 24 hr tablet Take 1 tablet (25 mg  total) by mouth daily. 07/11/15   Sueanne Margarita, MD  Multiple Vitamin (MULTIVITAMIN) tablet Take 1 tablet by mouth daily.    Historical Provider, MD  Red Yeast Rice 600 MG CAPS Take 2 capsules by mouth daily.    Historical Provider, MD  traZODone (DESYREL) 100 MG tablet Take 200 mg by mouth at bedtime.  09/15/13   Historical Provider, MD   BP 168/47 mmHg  Pulse 66  Temp(Src) 97.8 F (36.6 C) (Oral)  Resp 17  Ht 5\' 5"  (1.651 m)  Wt 175 lb (79.379 kg)  BMI 29.12 kg/m2  SpO2 98% Physical Exam  Constitutional: She appears well-developed and  well-nourished. No distress.  HENT:  Head: Normocephalic and atraumatic.  Eyes: Conjunctivae are normal. Pupils are equal, round, and reactive to light.  Neck: Neck supple. No tracheal deviation present. No thyromegaly present.  Cardiovascular: Normal rate and regular rhythm.   Murmur heard. Grade 3/6 systolic ejection murmur at left sternal border  Pulmonary/Chest: Effort normal and breath sounds normal.  Abdominal: Soft. Bowel sounds are normal. She exhibits no distension. There is no tenderness.  Musculoskeletal: Normal range of motion. She exhibits no edema or tenderness.  Neurological: She is alert. Coordination normal.  Skin: Skin is warm and dry. No rash noted.  Psychiatric: She has a normal mood and affect.  Nursing note and vitals reviewed.   ED Course  Procedures (including critical care time) Labs Review Labs Reviewed  CBC - Abnormal; Notable for the following:    Hemoglobin 15.5 (*)    All other components within normal limits  COMPREHENSIVE METABOLIC PANEL - Abnormal; Notable for the following:    Glucose, Bld 139 (*)    Creatinine, Ser 1.06 (*)    Total Protein 6.4 (*)    GFR calc non Af Amer 52 (*)    All other components within normal limits  I-STAT TROPOININ, ED    Imaging Review Dg Chest 2 View  12/11/2015  CLINICAL DATA:  Shortness of breath and cough EXAM: CHEST  2 VIEW COMPARISON:  6/4/9 FINDINGS: The heart size and mediastinal contours are within normal limits. Aortic atherosclerosis noted. Both lungs are clear. The visualized skeletal structures are unremarkable. IMPRESSION: 1. No acute cardiopulmonary abnormality. Electronically Signed   By: Kerby Moors M.D.   On: 12/11/2015 11:32   I have personally reviewed and evaluated these images and lab results as part of my medical decision-making.   EKG Interpretation   Date/Time:  Sunday December 11 2015 10:56:34 EST Ventricular Rate:  68 PR Interval:  156 QRS Duration: 86 QT Interval:  390 QTC  Calculation: 414 R Axis:   40 Text Interpretation:  Normal sinus rhythm Normal ECG No significant change  since last tracing Confirmed by Winfred Leeds  MD, Micholas Drumwright [phone]) on 12/11/2015  12:50:47 PM     Chest x-ray viewed by me  3:35 PM patient feels much improved after treatment with intravenous Lasix and feels ready to go home. She is able to walk up and down the hallways of the emergency department without dyspnea. I spoke with Dr. Cyndia Bent via telephone. Emergent aortic valve replacement is not needed. Patient is in no distress. Dr. Cyndia Bent suggest consult cardiology for medical management. Patient will need aortic valve replacement likely within the next few weeks. I spoke with Dr. Sung Amabile from cardiology service who requested intravenous Lasix, And close patient follow-up. Results for orders placed or performed during the hospital encounter of 12/11/15  CBC  Result Value Ref Range  WBC 5.2 4.0 - 10.5 K/uL   RBC 4.76 3.87 - 5.11 MIL/uL   Hemoglobin 15.5 (H) 12.0 - 15.0 g/dL   HCT 44.1 36.0 - 46.0 %   MCV 92.6 78.0 - 100.0 fL   MCH 32.6 26.0 - 34.0 pg   MCHC 35.1 30.0 - 36.0 g/dL   RDW 12.9 11.5 - 15.5 %   Platelets 269 150 - 400 K/uL  Comprehensive metabolic panel  Result Value Ref Range   Sodium 139 135 - 145 mmol/L   Potassium 4.1 3.5 - 5.1 mmol/L   Chloride 108 101 - 111 mmol/L   CO2 24 22 - 32 mmol/L   Glucose, Bld 139 (H) 65 - 99 mg/dL   BUN 14 6 - 20 mg/dL   Creatinine, Ser 1.06 (H) 0.44 - 1.00 mg/dL   Calcium 9.9 8.9 - 10.3 mg/dL   Total Protein 6.4 (L) 6.5 - 8.1 g/dL   Albumin 3.6 3.5 - 5.0 g/dL   AST 30 15 - 41 U/L   ALT 23 14 - 54 U/L   Alkaline Phosphatase 73 38 - 126 U/L   Total Bilirubin 0.7 0.3 - 1.2 mg/dL   GFR calc non Af Amer 52 (L) >60 mL/min   GFR calc Af Amer >60 >60 mL/min   Anion gap 7 5 - 15  Brain natriuretic peptide  Result Value Ref Range   B Natriuretic Peptide 82.4 0.0 - 100.0 pg/mL  I-stat troponin, ED (not at [facility])  Result Value Ref  Range   Troponin i, poc 0.01 0.00 - 0.08 ng/mL   Comment 3           Dg Chest 2 View  12/11/2015  CLINICAL DATA:  Shortness of breath and cough EXAM: CHEST  2 VIEW COMPARISON:  6/4/9 FINDINGS: The heart size and mediastinal contours are within normal limits. Aortic atherosclerosis noted. Both lungs are clear. The visualized skeletal structures are unremarkable. IMPRESSION: 1. No acute cardiopulmonary abnormality. Electronically Signed   By: Kerby Moors M.D.   On: 12/11/2015 11:32    MDM  Plan patient advised to keep her scheduled appointment with thoracic surgery in 3 days. Return if dyspnea worsens or if concerned.  F/u with pmd for hyperglycemia Dx #1dyspnea #2 aortic stenosis #3 hyperglycemia Final diagnoses:  None        Orlie Dakin, MD 12/11/15 1542

## 2015-12-11 NOTE — ED Notes (Signed)
Ambulatory to bathroom and then in hallway for Dr. Winfred Leeds without difficulty.  Pt states she does not feel "winded" now after ambulating.

## 2015-12-11 NOTE — Discharge Instructions (Signed)
Keep your scheduled appointment with the thoracic surgeon in 3 days. Rest until you see him and don't overexert yourself. Return or call Dr. Radford Pax if breathing worsens or if concerned for any reason. Your blood sugar today is mildly elevated at 139. Ask your primary care physician to order a test known as hemoglobin A1c to check you for diabetes.

## 2015-12-14 ENCOUNTER — Encounter: Payer: Self-pay | Admitting: Thoracic Surgery (Cardiothoracic Vascular Surgery)

## 2015-12-14 ENCOUNTER — Institutional Professional Consult (permissible substitution) (INDEPENDENT_AMBULATORY_CARE_PROVIDER_SITE_OTHER): Payer: Medicare Other | Admitting: Thoracic Surgery (Cardiothoracic Vascular Surgery)

## 2015-12-14 VITALS — BP 188/71 | HR 72 | Resp 16 | Ht 65.0 in | Wt 175.0 lb

## 2015-12-14 DIAGNOSIS — I350 Nonrheumatic aortic (valve) stenosis: Secondary | ICD-10-CM

## 2015-12-14 DIAGNOSIS — I352 Nonrheumatic aortic (valve) stenosis with insufficiency: Secondary | ICD-10-CM | POA: Diagnosis not present

## 2015-12-14 NOTE — Progress Notes (Signed)
PCP is Kandice Hams, MD Referring Provider is Lauree Chandler D*/ Fransico Him- Primary cardiologist  Chief Complaint  Patient presents with  . Aortic Stenosis    eval for AVR...ECHO 11/25/15, CATHED 11/30/15  . Shortness of Breath    HPI: 69 year old woman with known aortic stenosis and aortic insufficiency who presents with a chief complaint of shortness of breath with exertion.  Mrs. Stockett is a 69 y.o. woman with a history of hypertension, moderate AR and AS, moderate MR, mild pulmonary hypertension, and grade II diastolic dysfunction. She recently saw Dr. Radford Pax for a scheduled follow-up visit. At that visit she complained of chest tightness and shortness of breath with exertion. This had been present for some time but had worsened just before she saw Dr. Radford Pax. A repeat echocardiogram showed progression from moderate to severe aortic stenosis and from moderate to moderately severe aortic insufficiency. She had only mild mitral regurgitation. Her peak gradient was 78 mmHg and mean gradient was 43 mmHg. She then underwent cardiac catheterization which revealed normal coronary arteries. Her gradients did not measure his high with a 11 mm mean gradient. Her valve area was calculated at 1.02 cm.  She had previously been walking about 3 miles a day. Over the past year that had cut down to 2 miles a day. She recently has noted the shortness of breath coming on with even less exertion such as walking from the parking lot into [employer]. She has not had any rest or nocturnal symptoms. She denies swelling in her legs, orthopnea, and paroxysmal nocturnal dyspnea. She denies any syncope or presyncope.  Past Medical History  Diagnosis Date  . Mitral valve disorder     moderate MR on echo 07/2014  . Depression     PTSD  . Hyperlipidemia   . Aortic stenosis      moderate aortic insufficiency and moderate  aortic stenosis by echo 07/2014  . Hypothyroidism   . CHF (congestive heart failure) (HCC)      /Diastolic dysfunction  . Pulmonary hypertension ([place])   . Glaucoma   . Chronic kidney disease (CKD), stage III (moderate)   . AR (aortic regurgitation)   . Hypertension     Past Surgical History  Procedure Laterality Date  . Cataract extraction    . Cardiac catheterization  11/04/2009    normal coronaries  . Carpal tunnel release  1982  . Cardiac catheterization N/A 11/30/2015    Procedure: Right/Left Heart Cath and Coronary Angiography;  Surgeon: Burnell Blanks, MD;  Location: [facility];  Service: Cardiovascular;  Laterality: N/A;    Family History  Problem Relation Age of Onset  . Hypertension Father   . Heart disease Father   . Diabetes Father   . Diabetes Mother   . Suicidality Mother 42    Social History Social History  Substance Use Topics  . Smoking status: Former Smoker    Quit date: 10/29/1976  . Smokeless tobacco: Never Used  . Alcohol Use: Yes     Comment: Occ    Current Outpatient Prescriptions  Medication Sig Dispense Refill  . amLODipine (NORVASC) 2.5 MG tablet TAKE 1 TABLET DAILY. 30 tablet 6  . lamoTRIgine (LAMICTAL) 100 MG tablet Take 100-200 mg by mouth 2 (two) times daily. 200mg  in the morning and 100mg  in the evening    . latanoprost (XALATAN) 0.005 % ophthalmic solution Place 1 drop into both eyes at bedtime.    Marland Kitchen levothyroxine (SYNTHROID, LEVOTHROID) 200 MCG tablet Take 200 mcg  by mouth daily.    Marland Kitchen losartan (COZAAR) 100 MG tablet Take 100 mg by mouth daily.     . metoprolol succinate (TOPROL-XL) 25 MG 24 hr tablet Take 1 tablet (25 mg total) by mouth daily. 30 tablet 11  . Red Yeast Rice 600 MG CAPS Take 1 capsule by mouth daily.     . traZODone (DESYREL) 100 MG tablet Take 200 mg by mouth at bedtime.     . Multiple Vitamin (MULTIVITAMIN) tablet Take 1 tablet by mouth daily.     No current facility-administered medications for this visit.    Allergies  Allergen Reactions  . Risperdal [Risperidone]     sever mental and  physical degeneration     Review of Systems  Constitutional: Positive for activity change. Negative for fever, appetite change and unexpected weight change.  HENT: Positive for hearing loss. Negative for congestion, dental problem and nosebleeds.   Eyes: Positive for visual disturbance (Glaucoma, floaters).  Respiratory: Positive for cough and shortness of breath. Negative for wheezing.   Cardiovascular: Positive for chest pain and palpitations. Negative for leg swelling.  Gastrointestinal: Positive for constipation. Negative for abdominal pain and blood in stool.  Genitourinary: Negative for dysuria and hematuria.  Neurological: Positive for dizziness. Negative for syncope, weakness and headaches.  Hematological: Does not bruise/bleed easily.  All other systems reviewed and are negative.   BP 188/71 mmHg  Pulse 72  Resp 16  Ht 5\' 5"  (1.651 m)  Wt 175 lb (79.379 kg)  BMI 29.12 kg/m2  SpO2 98% Physical Exam  Constitutional: She is oriented to person, place, and time. No distress.  Obese  HENT:  Head: Normocephalic and atraumatic.  Mouth/Throat: No oropharyngeal exudate.  Eyes: Conjunctivae and EOM are normal. No scleral icterus.  Neck: Neck supple. No thyromegaly present.  Transmitted murmur bilaterally  Cardiovascular: Normal rate, regular rhythm and intact distal pulses.   Murmur (3/6 systolic with faint diastolic component) heard. Pulmonary/Chest: Effort normal and breath sounds normal. No respiratory distress. She has no wheezes. She has no rales.  Abdominal: Soft. Bowel sounds are normal. She exhibits no distension. There is no tenderness.  Musculoskeletal: Normal range of motion. She exhibits no edema.  Lymphadenopathy:    She has no cervical adenopathy.  Neurological: She is alert and oriented to person, place, and time. No cranial nerve deficit. She exhibits normal muscle tone. Coordination normal.  Skin: Skin is warm and dry.  Vitals reviewed.    Diagnostic  Tests: Echocardiogram 11/25/2015 Study Conclusions  - Left ventricle: The cavity size was normal. Systolic function was vigorous. The estimated ejection fraction was in the range of 65% to 70%. Wall motion was normal; there were no regional wall motion abnormalities. Features are consistent with a pseudonormal left ventricular filling pattern, with concomitant abnormal relaxation and increased filling pressure (grade 2 diastolic dysfunction). - Aortic valve: Trileaflet; moderately thickened, moderately calcified leaflets. (Left and non coronary cusp are demonstrating predominant calcification) Cusp separation was reduced. There was severe stenosis. There was moderate to severe regurgitation. Peak velocity (S): 442 cm/s. Mean gradient (S): 43 mm Hg. Peak gradient (S): 78 mm Hg. - Mitral valve: There was mild regurgitation. - Pulmonary arteries: Systolic pressure was mildly increased. PA peak pressure: 37 mm Hg (S).  Impressions:  - When compared to prior, aortic stenosis is now severe. Aortic regurgitation appears worsened as well. EF remains normal.  Cardiac catheterization 11/30/2015 1. No angiographic evidence of CAD 2. Severe aortic valve stenosis by echo (Cath data  with peak to peak gradient 14 mm Hg, mean gradient 13.7 mm Hg, AVA 1.03 cm2).  3. Mild elevation filling pressures.   Recommendations: Will discuss with Dr. Radford Pax. She appears to have severe AS with moderate AI by echo. She is relatively young and very active. She appears to be a good candidate for AVR by conventional approach. I will place a consult to our CT surgery division to discuss AVR.   Impression: 69 year old woman with severe aortic stenosis and moderately severe aortic regurgitation with accelerating exertional angina. Aortic valve replacement is indicated in the setting of severe aortic stenosis with symptoms for survival benefit and symptom relief.  I discussed the surgical  options with Mrs. Dagher. We discussed tissue versus mechanical valves. We discussed the relative advantages and disadvantages for both of those approaches. Given her age a tissue valve would be the appropriate selection. She had questions about transcatheter valves, but she does not fit the risk category for that procedure.  I have discussed the general nature of the procedure, the need for general anesthesia, and the incision to be used with Mrs. Tomaso and her healthcare POA. We discussed the expected hospital stay, overall recovery and short and long term outcomes.  I reviewed the indications, risks, benefits, and alternatives. She understands the risks include, but are not limited to death, stroke, MI, DVT/PE, bleeding, possible need for transfusion, infections, cardiac arrhythmias, heart block requiring pacemaker placement, and other organ system dysfunction including  respiratory, renal, or GI complications.   She accepts the risks and agrees to proceed.  Plan:  Aortic valve replacement on Friday, 12/16/2015  Melrose Nakayama, MD [facility] [phone]

## 2015-12-15 ENCOUNTER — Other Ambulatory Visit: Payer: Self-pay | Admitting: *Deleted

## 2015-12-15 ENCOUNTER — Encounter (HOSPITAL_COMMUNITY)
Admission: RE | Admit: 2015-12-15 | Discharge: 2015-12-15 | Disposition: A | Discharge status: Home / Self Care | Payer: Medicare Other | Source: Ambulatory Visit | Attending: Thoracic Surgery (Cardiothoracic Vascular Surgery) | Admitting: Thoracic Surgery (Cardiothoracic Vascular Surgery)

## 2015-12-15 ENCOUNTER — Ambulatory Visit (HOSPITAL_COMMUNITY)
Admission: RE | Admit: 2015-12-15 | Discharge: 2015-12-15 | Disposition: A | Discharge status: Home / Self Care | Payer: Medicare Other | Source: Ambulatory Visit | Attending: Thoracic Surgery (Cardiothoracic Vascular Surgery) | Admitting: Thoracic Surgery (Cardiothoracic Vascular Surgery)

## 2015-12-15 ENCOUNTER — Encounter (HOSPITAL_COMMUNITY): Payer: Self-pay

## 2015-12-15 ENCOUNTER — Ambulatory Visit (HOSPITAL_BASED_OUTPATIENT_CLINIC_OR_DEPARTMENT_OTHER)
Admission: RE | Admit: 2015-12-15 | Discharge: 2015-12-15 | Disposition: A | Discharge status: Home / Self Care | Payer: Medicare Other | Source: Ambulatory Visit | Attending: Thoracic Surgery (Cardiothoracic Vascular Surgery) | Admitting: Thoracic Surgery (Cardiothoracic Vascular Surgery)

## 2015-12-15 VITALS — BP 139/49 | HR 62 | Temp 97.7°F | Resp 18 | Ht 65.0 in | Wt 179.2 lb

## 2015-12-15 DIAGNOSIS — I272 Other secondary pulmonary hypertension: Secondary | ICD-10-CM | POA: Diagnosis not present

## 2015-12-15 DIAGNOSIS — I350 Nonrheumatic aortic (valve) stenosis: Secondary | ICD-10-CM

## 2015-12-15 DIAGNOSIS — I080 Rheumatic disorders of both mitral and aortic valves: Secondary | ICD-10-CM | POA: Diagnosis not present

## 2015-12-15 DIAGNOSIS — E1122 Type 2 diabetes mellitus with diabetic chronic kidney disease: Secondary | ICD-10-CM | POA: Diagnosis not present

## 2015-12-15 DIAGNOSIS — I5033 Acute on chronic diastolic (congestive) heart failure: Secondary | ICD-10-CM | POA: Diagnosis not present

## 2015-12-15 HISTORY — DX: Adverse effect of unspecified anesthetic, initial encounter: T41.45XA

## 2015-12-15 HISTORY — DX: Other complications of anesthesia, initial encounter: T88.59XA

## 2015-12-15 HISTORY — DX: Constipation, unspecified: K59.00

## 2015-12-15 LAB — BLOOD GAS, ARTERIAL
ACID-BASE DEFICIT: 1.6 mmol/L (ref 0.0–2.0)
BICARBONATE: 22.2 meq/L (ref 20.0–24.0)
Drawn by: 206361
FIO2: 0.21
O2 SAT: 95.4 %
PATIENT TEMPERATURE: 98.6
PO2 ART: 76.8 mmHg — AB (ref 80.0–100.0)
TCO2: 23.3 mmol/L (ref 0–100)
pCO2 arterial: 35.3 mmHg (ref 35.0–45.0)
pH, Arterial: 7.415 (ref 7.350–7.450)

## 2015-12-15 LAB — SURGICAL PCR SCREEN
MRSA, PCR: NEGATIVE
Staphylococcus aureus: NEGATIVE

## 2015-12-15 LAB — PULMONARY FUNCTION TEST
DL/VA % PRED: 71 %
DL/VA: 3.44 ml/min/mmHg/L
DLCO COR: 15.76 ml/min/mmHg
DLCO cor % pred: 64 %
DLCO unc % pred: 68 %
DLCO unc: 16.7 ml/min/mmHg
FEF 25-75 Post: 3.26 L/sec
FEF 25-75 Pre: 2.71 L/sec
FEF2575-%CHANGE-POST: 20 %
FEF2575-%PRED-POST: 171 %
FEF2575-%Pred-Pre: 142 %
FEV1-%CHANGE-POST: 3 %
FEV1-%PRED-POST: 119 %
FEV1-%PRED-PRE: 115 %
FEV1-PRE: 2.62 L
FEV1-Post: 2.72 L
FEV1FVC-%CHANGE-POST: 5 %
FEV1FVC-%Pred-Pre: 107 %
FEV6-%Change-Post: -1 %
FEV6-%PRED-PRE: 112 %
FEV6-%Pred-Post: 110 %
FEV6-PRE: 3.22 L
FEV6-Post: 3.17 L
FEV6FVC-%Change-Post: 0 %
FEV6FVC-%PRED-PRE: 104 %
FEV6FVC-%Pred-Post: 104 %
FVC-%Change-Post: -1 %
FVC-%PRED-POST: 105 %
FVC-%PRED-PRE: 107 %
FVC-POST: 3.17 L
FVC-PRE: 3.22 L
POST FEV1/FVC RATIO: 86 %
POST FEV6/FVC RATIO: 100 %
Pre FEV1/FVC ratio: 81 %
Pre FEV6/FVC Ratio: 100 %
RV % PRED: 89 %
RV: 1.96 L
TLC % pred: 103 %
TLC: 5.24 L

## 2015-12-15 LAB — URINALYSIS, ROUTINE W REFLEX MICROSCOPIC
Bilirubin Urine: NEGATIVE
Glucose, UA: NEGATIVE mg/dL
Hgb urine dipstick: NEGATIVE
Ketones, ur: NEGATIVE mg/dL
LEUKOCYTES UA: NEGATIVE
NITRITE: NEGATIVE
PH: 6 (ref 5.0–8.0)
Protein, ur: NEGATIVE mg/dL
SPECIFIC GRAVITY, URINE: 1.004 — AB (ref 1.005–1.030)

## 2015-12-15 LAB — TYPE AND SCREEN
ABO/RH(D): AB POS
Antibody Screen: NEGATIVE

## 2015-12-15 LAB — PROTIME-INR
INR: 1.02 (ref 0.00–1.49)
Prothrombin Time: 13.6 seconds (ref 11.6–15.2)

## 2015-12-15 LAB — ABO/RH: ABO/RH(D): AB POS

## 2015-12-15 LAB — APTT: APTT: 28 s (ref 24–37)

## 2015-12-15 MED ORDER — CHLORHEXIDINE GLUCONATE 4 % EX LIQD: 30.0000 mL | CUTANEOUS | Status: DC

## 2015-12-15 MED ORDER — VANCOMYCIN HCL 10 G IV SOLR
1250.0000 mg | INTRAVENOUS | Status: AC
  Administered 2015-12-16: 1250 mg via INTRAVENOUS
  Filled 2015-12-15: qty 1250

## 2015-12-15 MED ORDER — PLASMA-LYTE 148 IV SOLN
INTRAVENOUS | Status: AC
  Administered 2015-12-16: 500 mL
  Filled 2015-12-15: qty 2.5

## 2015-12-15 MED ORDER — SODIUM CHLORIDE 0.9 % IV SOLN
INTRAVENOUS | Status: AC
  Administered 2015-12-16: 69.8 mL/h via INTRAVENOUS
  Filled 2015-12-15: qty 40

## 2015-12-15 MED ORDER — POTASSIUM CHLORIDE 2 MEQ/ML IV SOLN
80.0000 meq | INTRAVENOUS | Status: DC
  Filled 2015-12-15: qty 40

## 2015-12-15 MED ORDER — DEXTROSE 5 % IV SOLN
1.5000 g | INTRAVENOUS | Status: AC
  Administered 2015-12-16: .75 g via INTRAVENOUS
  Administered 2015-12-16: 1.5 g via INTRAVENOUS
  Filled 2015-12-15: qty 1.5

## 2015-12-15 MED ORDER — DOPAMINE-DEXTROSE 3.2-5 MG/ML-% IV SOLN
0.0000 ug/kg/min | INTRAVENOUS | Status: DC
  Filled 2015-12-15: qty 250

## 2015-12-15 MED ORDER — METOPROLOL TARTRATE 12.5 MG HALF TABLET: 12.5000 mg | ORAL_TABLET | Freq: Once | ORAL | Status: DC

## 2015-12-15 MED ORDER — NITROGLYCERIN IN D5W 200-5 MCG/ML-% IV SOLN
2.0000 ug/min | INTRAVENOUS | Status: AC
  Administered 2015-12-16: 5 ug/min via INTRAVENOUS
  Filled 2015-12-15: qty 250

## 2015-12-15 MED ORDER — CHLORHEXIDINE GLUCONATE 0.12 % MT SOLN: 15.0000 mL | Freq: Once | OROMUCOSAL | Status: DC

## 2015-12-15 MED ORDER — SODIUM CHLORIDE 0.9 % IV SOLN
INTRAVENOUS | Status: DC
  Filled 2015-12-15: qty 30

## 2015-12-15 MED ORDER — PHENYLEPHRINE HCL 10 MG/ML IJ SOLN
30.0000 ug/min | INTRAVENOUS | Status: AC
  Administered 2015-12-16: 20 ug/min via INTRAVENOUS
  Filled 2015-12-15: qty 2

## 2015-12-15 MED ORDER — EPINEPHRINE HCL 1 MG/ML IJ SOLN
0.0000 ug/min | INTRAVENOUS | Status: DC
  Filled 2015-12-15: qty 4

## 2015-12-15 MED ORDER — DEXMEDETOMIDINE HCL IN NACL 400 MCG/100ML IV SOLN
0.1000 ug/kg/h | INTRAVENOUS | Status: AC
  Administered 2015-12-16: .3 ug/kg/h via INTRAVENOUS
  Filled 2015-12-15: qty 100

## 2015-12-15 MED ORDER — DEXTROSE 5 % IV SOLN
750.0000 mg | INTRAVENOUS | Status: DC
  Filled 2015-12-15: qty 750

## 2015-12-15 MED ORDER — ALBUTEROL SULFATE (2.5 MG/3ML) 0.083% IN NEBU
2.5000 mg | INHALATION_SOLUTION | Freq: Once | RESPIRATORY_TRACT | Status: AC
  Administered 2015-12-15: 2.5 mg via RESPIRATORY_TRACT

## 2015-12-15 MED ORDER — SODIUM CHLORIDE 0.9 % IV SOLN
INTRAVENOUS | Status: AC
  Administered 2015-12-16: 1 [IU]/h via INTRAVENOUS
  Filled 2015-12-15: qty 2.5

## 2015-12-15 MED ORDER — MAGNESIUM SULFATE 50 % IJ SOLN
40.0000 meq | INTRAMUSCULAR | Status: DC
  Filled 2015-12-15: qty 10

## 2015-12-15 NOTE — Progress Notes (Signed)
VASCULAR LAB PRELIMINARY  PRELIMINARY  PRELIMINARY  PRELIMINARY  Pre-op Cardiac Surgery  Carotid Findings:  Bilateral:  1-39% ICA stenosis.  Vertebral artery flow is antegrade.     Upper Extremity Right Left  Brachial Pressures 164 Triphasic 169 Triphasic  Radial Waveforms Triphasic Triphasic  Ulnar Waveforms Triphasic Triphasic  Palmar Arch (Allen's Test) Normal Normal   Findings:  Doppler waveforms remained normal bilaterally with both radial and ulnar compressions.    Audi Wettstein, [place], RVS 12/15/2015, 12:33 PM

## 2015-12-15 NOTE — Pre-Procedure Instructions (Signed)
    Maureen Keller  12/15/2015     Your procedure is scheduled on Friday, December 30.  Report to [facility] Admitting at 5:30 A.M.               Your surgery or procedure is scheduled for 7:30 AM   Call this number if you have problems the morning of surgery:[phone]          Remember:  Do not eat food or drink liquids after midnight.  Take these medicines the morning of surgery with A SIP OF WATER :amLODipine (NORVASC), lamoTRIgine (LAMICTAL), levothyroxine (SYNTHROID, LEVOTHROID),  metoprolol succinate (TOPROL-XL).                  Do not take any Aspirin, Aspirin Products, Aleve, Ibuprofen, Vitamins, Herbal Medications.   Do not wear jewelry, make-up or nail polish.  Do not wear lotions, powders, or perfumes.    Do not shave 48 hours prior to surgery.    Do not bring valuables to the hospital.  [facility] is not responsible for any belongings or valuables.  Contacts, dentures or bridgework may not be worn into surgery.  Leave your suitcase in the car.  After surgery it may be brought to your room.  For patients admitted to the hospital, discharge time will be determined by your treatment team.  Special instructions:  Review   - Preparing For Surgery.  Please read over the following fact sheets that you were given. Pain Booklet, Coughing and Deep Breathing and Surgical Site Infection Prevention

## 2015-12-15 NOTE — Anesthesia Preprocedure Evaluation (Addendum)
Anesthesia Evaluation    Reviewed: Allergy & Precautions, NPO status   History of Anesthesia Complications (+) AWARENESS UNDER ANESTHESIA and history of anesthetic complications  Airway Mallampati: I  TM Distance: >3 FB Neck ROM: Full    Dental   Pulmonary shortness of breath and with exertion, former smoker,    breath sounds clear to auscultation       Cardiovascular Exercise Tolerance: Poor hypertension, Pt. on medications and Pt. on home beta blockers +CHF and + DOE  + Valvular Problems/Murmurs AS and MR  Rhythm:Regular Rate:Normal     Neuro/Psych PSYCHIATRIC DISORDERS Anxiety Depression PTSD   GI/Hepatic   Endo/Other  Hypothyroidism   Renal/GU Renal diseaseChronic kidney disease (CKD), stage III  Creat 1.06 (12/11/15)     Musculoskeletal   Abdominal   Peds  Hematology   Anesthesia Other Findings   Reproductive/Obstetrics                         EKG 12/12/15: NSR  ECHO 1206/16: Study Conclusions  - Left ventricle: The cavity size was normal. Systolic function was vigorous. The estimated ejection fraction was in the range of 65% to 70%. Wall motion was normal; there were no regional wall motion abnormalities. Features are consistent with a pseudonormal left ventricular filling pattern, with concomitant abnormal relaxation and increased filling pressure (grade 2 diastolic dysfunction). - Aortic valve: Trileaflet; moderately thickened, moderately calcified leaflets. (Left and non coronary cusp are demonstrating predominant calcification) Cusp separation was reduced. There was severe stenosis. There was moderate to severe regurgitation. Peak velocity (S): 442 cm/s. Mean gradient (S): 43 mm Hg. Peak gradient (S): 78 mm Hg. - Mitral valve: There was mild regurgitation. - Pulmonary arteries: Systolic pressure was mildly increased. PA peak pressure: 37 mm Hg  (S).  Impressions:  - When compared to prior, aortic stenosis is now severe. Aortic regurgitation appears worsened as well. EF remains normal.  Cardiac catheterization 11/30/2015: 1. No angiographic evidence of CAD 2. Severe aortic valve stenosis by echo (Cath data with peak to peak gradient 14 mm Hg, mean gradient 13.7 mm Hg, AVA 1.03 cm2).  3. Mild elevation filling pressures.   BP Readings from Last 3 Encounters:  12/15/15 139/49  12/14/15 188/71  12/11/15 144/54   Lab Results  Component Value Date   WBC 5.2 12/11/2015   HGB 15.5* 12/11/2015   HCT 44.1 12/11/2015   MCV 92.6 12/11/2015   PLT 269 12/11/2015     Chemistry      Component Value Date/Time   NA 139 12/11/2015 1139   K 4.1 12/11/2015 1139   CL 108 12/11/2015 1139   CO2 24 12/11/2015 1139   BUN 14 12/11/2015 1139   CREATININE 1.06* 12/11/2015 1139   CREATININE 0.91 11/29/2015 1039      Component Value Date/Time   CALCIUM 9.9 12/11/2015 1139   ALKPHOS 73 12/11/2015 1139   AST 30 12/11/2015 1139   ALT 23 12/11/2015 1139   BILITOT 0.7 12/11/2015 1139     Lab Results  Component Value Date   INR 1.02 12/15/2015   INR 0.96 11/29/2015    Anesthesia Physical Anesthesia Plan  ASA: III  Anesthesia Plan: General   Post-op Pain Management:    Induction: Intravenous  Airway Management Planned: Oral ETT  Additional Equipment: PA Cath, TEE and Ultrasound Guidance Line Placement  Intra-op Plan:   Post-operative Plan:   Informed Consent: I have reviewed the patients History and Physical, chart, labs and discussed  the procedure including the risks, benefits and alternatives for the proposed anesthesia with the patient or authorized representative who has indicated his/her understanding and acceptance.     Plan Discussed with: CRNA and Surgeon  Anesthesia Plan Comments:         Anesthesia Quick Evaluation

## 2015-12-16 ENCOUNTER — Inpatient Hospital Stay (HOSPITAL_COMMUNITY): Payer: Medicare Other | Admitting: Certified Registered Nurse Anesthetist

## 2015-12-16 ENCOUNTER — Encounter (HOSPITAL_COMMUNITY)
Admission: RE | Disposition: A | Discharge status: Home Health | Payer: Medicare Other | Source: Ambulatory Visit | Attending: Thoracic Surgery (Cardiothoracic Vascular Surgery)

## 2015-12-16 ENCOUNTER — Encounter (HOSPITAL_COMMUNITY): Payer: Self-pay | Admitting: *Deleted

## 2015-12-16 ENCOUNTER — Inpatient Hospital Stay (HOSPITAL_COMMUNITY): Payer: Medicare Other

## 2015-12-16 ENCOUNTER — Inpatient Hospital Stay (HOSPITAL_COMMUNITY)
Admission: RE | Admit: 2015-12-16 | Discharge: 2015-12-22 | DRG: 219 | Disposition: A | Discharge status: Home Health | Payer: Medicare Other | Source: Ambulatory Visit | Attending: Thoracic Surgery (Cardiothoracic Vascular Surgery) | Admitting: Thoracic Surgery (Cardiothoracic Vascular Surgery)

## 2015-12-16 DIAGNOSIS — I272 Other secondary pulmonary hypertension: Secondary | ICD-10-CM | POA: Diagnosis present

## 2015-12-16 DIAGNOSIS — J9811 Atelectasis: Secondary | ICD-10-CM | POA: Diagnosis not present

## 2015-12-16 DIAGNOSIS — Z4682 Encounter for fitting and adjustment of non-vascular catheter: Secondary | ICD-10-CM | POA: Diagnosis not present

## 2015-12-16 DIAGNOSIS — Z952 Presence of prosthetic heart valve: Secondary | ICD-10-CM

## 2015-12-16 DIAGNOSIS — Z87891 Personal history of nicotine dependence: Secondary | ICD-10-CM | POA: Diagnosis not present

## 2015-12-16 DIAGNOSIS — E039 Hypothyroidism, unspecified: Secondary | ICD-10-CM | POA: Diagnosis present

## 2015-12-16 DIAGNOSIS — I130 Hypertensive heart and chronic kidney disease with heart failure and stage 1 through stage 4 chronic kidney disease, or unspecified chronic kidney disease: Secondary | ICD-10-CM | POA: Diagnosis present

## 2015-12-16 DIAGNOSIS — I509 Heart failure, unspecified: Secondary | ICD-10-CM | POA: Diagnosis not present

## 2015-12-16 DIAGNOSIS — F4310 Post-traumatic stress disorder, unspecified: Secondary | ICD-10-CM | POA: Diagnosis present

## 2015-12-16 DIAGNOSIS — Z23 Encounter for immunization: Secondary | ICD-10-CM | POA: Diagnosis not present

## 2015-12-16 DIAGNOSIS — E1122 Type 2 diabetes mellitus with diabetic chronic kidney disease: Secondary | ICD-10-CM | POA: Diagnosis present

## 2015-12-16 DIAGNOSIS — I350 Nonrheumatic aortic (valve) stenosis: Secondary | ICD-10-CM | POA: Diagnosis not present

## 2015-12-16 DIAGNOSIS — N183 Chronic kidney disease, stage 3 (moderate): Secondary | ICD-10-CM | POA: Diagnosis not present

## 2015-12-16 DIAGNOSIS — I5033 Acute on chronic diastolic (congestive) heart failure: Secondary | ICD-10-CM | POA: Diagnosis not present

## 2015-12-16 DIAGNOSIS — I10 Essential (primary) hypertension: Secondary | ICD-10-CM | POA: Diagnosis not present

## 2015-12-16 DIAGNOSIS — D62 Acute posthemorrhagic anemia: Secondary | ICD-10-CM | POA: Diagnosis not present

## 2015-12-16 DIAGNOSIS — F329 Major depressive disorder, single episode, unspecified: Secondary | ICD-10-CM | POA: Diagnosis present

## 2015-12-16 DIAGNOSIS — R0602 Shortness of breath: Secondary | ICD-10-CM | POA: Diagnosis not present

## 2015-12-16 DIAGNOSIS — H409 Unspecified glaucoma: Secondary | ICD-10-CM | POA: Diagnosis present

## 2015-12-16 DIAGNOSIS — I080 Rheumatic disorders of both mitral and aortic valves: Principal | ICD-10-CM | POA: Diagnosis present

## 2015-12-16 DIAGNOSIS — I358 Other nonrheumatic aortic valve disorders: Secondary | ICD-10-CM | POA: Diagnosis not present

## 2015-12-16 DIAGNOSIS — E785 Hyperlipidemia, unspecified: Secondary | ICD-10-CM | POA: Diagnosis present

## 2015-12-16 DIAGNOSIS — D696 Thrombocytopenia, unspecified: Secondary | ICD-10-CM | POA: Diagnosis present

## 2015-12-16 HISTORY — PX: TEE WITHOUT CARDIOVERSION: SHX5443

## 2015-12-16 HISTORY — PX: AORTIC VALVE REPLACEMENT: SHX41

## 2015-12-16 LAB — GLUCOSE, CAPILLARY
GLUCOSE-CAPILLARY: 100 mg/dL — AB (ref 65–99)
GLUCOSE-CAPILLARY: 106 mg/dL — AB (ref 65–99)
GLUCOSE-CAPILLARY: 107 mg/dL — AB (ref 65–99)
GLUCOSE-CAPILLARY: 128 mg/dL — AB (ref 65–99)
GLUCOSE-CAPILLARY: 129 mg/dL — AB (ref 65–99)
GLUCOSE-CAPILLARY: 134 mg/dL — AB (ref 65–99)
GLUCOSE-CAPILLARY: 134 mg/dL — AB (ref 65–99)
Glucose-Capillary: 78 mg/dL (ref 65–99)
Glucose-Capillary: 149 mg/dL — ABNORMAL HIGH (ref 65–99)
Glucose-Capillary: 144 mg/dL — ABNORMAL HIGH (ref 65–99)
Glucose-Capillary: 120 mg/dL — ABNORMAL HIGH (ref 65–99)
Glucose-Capillary: 107 mg/dL — ABNORMAL HIGH (ref 65–99)

## 2015-12-16 LAB — POCT I-STAT 3, ART BLOOD GAS (G3+)
ACID-BASE DEFICIT: 6 mmol/L — AB (ref 0.0–2.0)
Acid-Base Excess: 5 mmol/L — ABNORMAL HIGH (ref 0.0–2.0)
Acid-base deficit: 3 mmol/L — ABNORMAL HIGH (ref 0.0–2.0)
Acid-base deficit: 4 mmol/L — ABNORMAL HIGH (ref 0.0–2.0)
BICARBONATE: 27.5 meq/L — AB (ref 20.0–24.0)
BICARBONATE: 20.9 meq/L (ref 20.0–24.0)
BICARBONATE: 19.1 meq/L — AB (ref 20.0–24.0)
Bicarbonate: 21 mEq/L (ref 20.0–24.0)
O2 SAT: 98 %
O2 Saturation: 100 %
O2 Saturation: 98 %
O2 Saturation: 97 %
PCO2 ART: 33.1 mmHg — AB (ref 35.0–45.0)
PCO2 ART: 30.5 mmHg — AB (ref 35.0–45.0)
PCO2 ART: 34.8 mmHg — AB (ref 35.0–45.0)
PH ART: 7.528 — AB (ref 7.350–7.450)
PH ART: 7.443 (ref 7.350–7.450)
PO2 ART: 112 mmHg — AB (ref 80.0–100.0)
PO2 ART: 99 mmHg (ref 80.0–100.0)
Patient temperature: 36.1
Patient temperature: 36.4
Patient temperature: 37
TCO2: 28 mmol/L (ref 0–100)
TCO2: 22 mmol/L (ref 0–100)
TCO2: 22 mmol/L (ref 0–100)
TCO2: 20 mmol/L (ref 0–100)
pCO2 arterial: 35.1 mmHg (ref 35.0–45.0)
pH, Arterial: 7.385 (ref 7.350–7.450)
pH, Arterial: 7.344 — ABNORMAL LOW (ref 7.350–7.450)
pO2, Arterial: 364 mmHg — ABNORMAL HIGH (ref 80.0–100.0)
pO2, Arterial: 100 mmHg (ref 80.0–100.0)

## 2015-12-16 LAB — POCT I-STAT, CHEM 8
BUN: 14 mg/dL (ref 6–20)
BUN: 11 mg/dL (ref 6–20)
BUN: 14 mg/dL (ref 6–20)
BUN: 13 mg/dL (ref 6–20)
BUN: 11 mg/dL (ref 6–20)
CALCIUM ION: 1.14 mmol/L (ref 1.13–1.30)
CALCIUM ION: 1.38 mmol/L — AB (ref 1.13–1.30)
CALCIUM ION: 1.41 mmol/L — AB (ref 1.13–1.30)
CHLORIDE: 106 mmol/L (ref 101–111)
CHLORIDE: 102 mmol/L (ref 101–111)
CHLORIDE: 102 mmol/L (ref 101–111)
CHLORIDE: 107 mmol/L (ref 101–111)
CREATININE: 0.6 mg/dL (ref 0.44–1.00)
CREATININE: 0.6 mg/dL (ref 0.44–1.00)
CREATININE: 0.7 mg/dL (ref 0.44–1.00)
Calcium, Ion: 1.3 mmol/L (ref 1.13–1.30)
Calcium, Ion: 1.03 mmol/L — ABNORMAL LOW (ref 1.13–1.30)
Chloride: 102 mmol/L (ref 101–111)
Creatinine, Ser: 0.6 mg/dL (ref 0.44–1.00)
Creatinine, Ser: 0.6 mg/dL (ref 0.44–1.00)
GLUCOSE: 102 mg/dL — AB (ref 65–99)
GLUCOSE: 100 mg/dL — AB (ref 65–99)
GLUCOSE: 146 mg/dL — AB (ref 65–99)
GLUCOSE: 144 mg/dL — AB (ref 65–99)
Glucose, Bld: 225 mg/dL — ABNORMAL HIGH (ref 65–99)
HCT: 29 % — ABNORMAL LOW (ref 36.0–46.0)
HCT: 26 % — ABNORMAL LOW (ref 36.0–46.0)
HCT: 27 % — ABNORMAL LOW (ref 36.0–46.0)
HCT: 31 % — ABNORMAL LOW (ref 36.0–46.0)
HEMATOCRIT: 40 % (ref 36.0–46.0)
Hemoglobin: 13.6 g/dL (ref 12.0–15.0)
Hemoglobin: 9.9 g/dL — ABNORMAL LOW (ref 12.0–15.0)
Hemoglobin: 8.8 g/dL — ABNORMAL LOW (ref 12.0–15.0)
Hemoglobin: 9.2 g/dL — ABNORMAL LOW (ref 12.0–15.0)
Hemoglobin: 10.5 g/dL — ABNORMAL LOW (ref 12.0–15.0)
POTASSIUM: 4.3 mmol/L (ref 3.5–5.1)
POTASSIUM: 4.1 mmol/L (ref 3.5–5.1)
Potassium: 5.7 mmol/L — ABNORMAL HIGH (ref 3.5–5.1)
Potassium: 5 mmol/L (ref 3.5–5.1)
Potassium: 4.5 mmol/L (ref 3.5–5.1)
SODIUM: 141 mmol/L (ref 135–145)
SODIUM: 140 mmol/L (ref 135–145)
Sodium: 140 mmol/L (ref 135–145)
Sodium: 134 mmol/L — ABNORMAL LOW (ref 135–145)
Sodium: 135 mmol/L (ref 135–145)
TCO2: 30 mmol/L (ref 0–100)
TCO2: 25 mmol/L (ref 0–100)
TCO2: 27 mmol/L (ref 0–100)
TCO2: 22 mmol/L (ref 0–100)
TCO2: 22 mmol/L (ref 0–100)

## 2015-12-16 LAB — CBC
HCT: 36.3 % (ref 36.0–46.0)
HCT: 32.4 % — ABNORMAL LOW (ref 36.0–46.0)
Hemoglobin: 12.8 g/dL (ref 12.0–15.0)
Hemoglobin: 11.5 g/dL — ABNORMAL LOW (ref 12.0–15.0)
MCH: 32.4 pg (ref 26.0–34.0)
MCH: 32.2 pg (ref 26.0–34.0)
MCHC: 35.3 g/dL (ref 30.0–36.0)
MCHC: 35.5 g/dL (ref 30.0–36.0)
MCV: 91.9 fL (ref 78.0–100.0)
MCV: 90.8 fL (ref 78.0–100.0)
PLATELETS: 143 10*3/uL — AB (ref 150–400)
PLATELETS: 155 10*3/uL (ref 150–400)
RBC: 3.95 MIL/uL (ref 3.87–5.11)
RBC: 3.57 MIL/uL — ABNORMAL LOW (ref 3.87–5.11)
RDW: 12.8 % (ref 11.5–15.5)
RDW: 12.8 % (ref 11.5–15.5)
WBC: 10.5 10*3/uL (ref 4.0–10.5)
WBC: 11.6 10*3/uL — AB (ref 4.0–10.5)

## 2015-12-16 LAB — POCT I-STAT 4, (NA,K, GLUC, HGB,HCT)
Glucose, Bld: 93 mg/dL (ref 65–99)
HCT: 35 % — ABNORMAL LOW (ref 36.0–46.0)
Hemoglobin: 11.9 g/dL — ABNORMAL LOW (ref 12.0–15.0)
POTASSIUM: 4 mmol/L (ref 3.5–5.1)
SODIUM: 138 mmol/L (ref 135–145)

## 2015-12-16 LAB — HEMOGLOBIN AND HEMATOCRIT, BLOOD
HEMATOCRIT: 29.6 % — AB (ref 36.0–46.0)
HEMOGLOBIN: 10.3 g/dL — AB (ref 12.0–15.0)

## 2015-12-16 LAB — CREATININE, SERUM
CREATININE: 0.85 mg/dL (ref 0.44–1.00)
GFR calc non Af Amer: >60 mL/min (ref >60)

## 2015-12-16 LAB — HEMOGLOBIN A1C
Hgb A1c MFr Bld: 5.5 % (ref 4.8–5.6)
MEAN PLASMA GLUCOSE: 111 mg/dL

## 2015-12-16 LAB — PLATELET COUNT: Platelets: 186 10*3/uL (ref 150–400)

## 2015-12-16 LAB — APTT: APTT: 34 s (ref 24–37)

## 2015-12-16 LAB — PROTIME-INR
INR: 1.53 — ABNORMAL HIGH (ref 0.00–1.49)
PROTHROMBIN TIME: 18.4 s — AB (ref 11.6–15.2)

## 2015-12-16 LAB — MAGNESIUM: Magnesium: 3 mg/dL — ABNORMAL HIGH (ref 1.7–2.4)

## 2015-12-16 SURGERY — REPLACEMENT, AORTIC VALVE, OPEN
Anesthesia: General | Site: Chest

## 2015-12-16 MED ORDER — ACETAMINOPHEN 500 MG PO TABS
1000.0000 mg | ORAL_TABLET | Freq: Four times a day (QID) | ORAL | Status: AC
  Administered 2015-12-16 – 2015-12-20 (×14): 1000 mg via ORAL
  Filled 2015-12-16 (×15): qty 2

## 2015-12-16 MED ORDER — FENTANYL CITRATE (PF) 100 MCG/2ML IJ SOLN
INTRAMUSCULAR | Status: DC | PRN
  Administered 2015-12-16: 150 ug via INTRAVENOUS
  Administered 2015-12-16: 100 ug via INTRAVENOUS
  Administered 2015-12-16: 50 ug via INTRAVENOUS
  Administered 2015-12-16: 150 ug via INTRAVENOUS
  Administered 2015-12-16 (×3): 50 ug via INTRAVENOUS
  Administered 2015-12-16 (×3): 100 ug via INTRAVENOUS
  Administered 2015-12-16: 250 ug via INTRAVENOUS
  Administered 2015-12-16: 100 ug via INTRAVENOUS
  Administered 2015-12-16: 150 ug via INTRAVENOUS

## 2015-12-16 MED ORDER — LACTATED RINGERS IV SOLN
INTRAVENOUS | Status: DC
  Administered 2015-12-18: 10 mL/h via INTRAVENOUS

## 2015-12-16 MED ORDER — ALBUMIN HUMAN 5 % IV SOLN
250.0000 mL | INTRAVENOUS | Status: AC | PRN
  Administered 2015-12-16 (×4): 250 mL via INTRAVENOUS
  Filled 2015-12-16 (×2): qty 250

## 2015-12-16 MED ORDER — SODIUM CHLORIDE 0.45 % IV SOLN
INTRAVENOUS | Status: DC | PRN
  Administered 2015-12-16: 20 mL via INTRAVENOUS

## 2015-12-16 MED ORDER — ASPIRIN EC 325 MG PO TBEC: 325.0000 mg | DELAYED_RELEASE_TABLET | Freq: Every day | ORAL | Status: DC

## 2015-12-16 MED ORDER — TRAMADOL HCL 50 MG PO TABS
50.0000 mg | ORAL_TABLET | ORAL | Status: DC | PRN
  Administered 2015-12-17 – 2015-12-20 (×4): 100 mg via ORAL
  Filled 2015-12-16 (×2): qty 2
  Filled 2015-12-16: qty 1
  Filled 2015-12-16 (×2): qty 2

## 2015-12-16 MED ORDER — INSULIN REGULAR BOLUS VIA INFUSION
0.0000 [IU] | Freq: Three times a day (TID) | INTRAVENOUS | Status: DC
  Filled 2015-12-16: qty 10

## 2015-12-16 MED ORDER — FENTANYL CITRATE (PF) 250 MCG/5ML IJ SOLN
INTRAMUSCULAR | Status: AC
  Filled 2015-12-16: qty 20

## 2015-12-16 MED ORDER — FAMOTIDINE IN NACL 20-0.9 MG/50ML-% IV SOLN
20.0000 mg | Freq: Two times a day (BID) | INTRAVENOUS | Status: AC
  Administered 2015-12-16: 20 mg via INTRAVENOUS

## 2015-12-16 MED ORDER — VANCOMYCIN HCL IN DEXTROSE 1-5 GM/200ML-% IV SOLN
1000.0000 mg | Freq: Once | INTRAVENOUS | Status: AC
  Administered 2015-12-16: 1000 mg via INTRAVENOUS
  Filled 2015-12-16: qty 200

## 2015-12-16 MED ORDER — DEXMEDETOMIDINE HCL IN NACL 200 MCG/50ML IV SOLN
0.0000 ug/kg/h | INTRAVENOUS | Status: DC
  Filled 2015-12-16: qty 50

## 2015-12-16 MED ORDER — ASPIRIN 81 MG PO CHEW: 324.0000 mg | CHEWABLE_TABLET | Freq: Every day | ORAL | Status: DC

## 2015-12-16 MED ORDER — ONDANSETRON HCL 4 MG/2ML IJ SOLN
4.0000 mg | Freq: Four times a day (QID) | INTRAMUSCULAR | Status: DC | PRN
Start: 2015-12-16 — End: 2015-12-22

## 2015-12-16 MED ORDER — WARFARIN SODIUM 2.5 MG PO TABS
2.5000 mg | ORAL_TABLET | Freq: Every day | ORAL | Status: DC
  Administered 2015-12-17: 2.5 mg via ORAL
  Filled 2015-12-16: qty 1

## 2015-12-16 MED ORDER — TRAZODONE HCL 100 MG PO TABS
200.0000 mg | ORAL_TABLET | Freq: Every day | ORAL | Status: DC
  Administered 2015-12-17 – 2015-12-21 (×5): 200 mg via ORAL
  Filled 2015-12-16 (×5): qty 2

## 2015-12-16 MED ORDER — CALCIUM CHLORIDE 10 % IV SOLN
1.0000 g | Freq: Once | INTRAVENOUS | Status: AC
  Administered 2015-12-16: 1 g via INTRAVENOUS

## 2015-12-16 MED ORDER — HEPARIN SODIUM (PORCINE) 1000 UNIT/ML IJ SOLN
INTRAMUSCULAR | Status: DC | PRN
  Administered 2015-12-16: 25000 [IU] via INTRAVENOUS

## 2015-12-16 MED ORDER — LIDOCAINE HCL (CARDIAC) 20 MG/ML IV SOLN
INTRAVENOUS | Status: DC | PRN
  Administered 2015-12-16: 50 mg via INTRAVENOUS

## 2015-12-16 MED ORDER — LACTATED RINGERS IV SOLN
INTRAVENOUS | Status: DC | PRN
  Administered 2015-12-16: 08:00:00 via INTRAVENOUS

## 2015-12-16 MED ORDER — MIDAZOLAM HCL 2 MG/2ML IJ SOLN: 2.0000 mg | INTRAMUSCULAR | Status: DC | PRN

## 2015-12-16 MED ORDER — HEPARIN SODIUM (PORCINE) 1000 UNIT/ML IJ SOLN
INTRAMUSCULAR | Status: AC
  Filled 2015-12-16: qty 1

## 2015-12-16 MED ORDER — PANTOPRAZOLE SODIUM 40 MG PO TBEC
40.0000 mg | DELAYED_RELEASE_TABLET | Freq: Every day | ORAL | Status: DC
  Filled 2015-12-16: qty 1

## 2015-12-16 MED ORDER — SODIUM CHLORIDE 0.9 % IJ SOLN: 3.0000 mL | INTRAMUSCULAR | Status: DC | PRN

## 2015-12-16 MED ORDER — SUCCINYLCHOLINE CHLORIDE 20 MG/ML IJ SOLN
INTRAMUSCULAR | Status: AC
  Filled 2015-12-16: qty 1

## 2015-12-16 MED ORDER — DEXTROSE 5 % IV SOLN
1.5000 g | Freq: Two times a day (BID) | INTRAVENOUS | Status: AC
  Administered 2015-12-16 – 2015-12-18 (×4): 1.5 g via INTRAVENOUS
  Filled 2015-12-16 (×6): qty 1.5

## 2015-12-16 MED ORDER — ACETAMINOPHEN 160 MG/5ML PO SOLN
1000.0000 mg | Freq: Four times a day (QID) | ORAL | Status: AC
  Filled 2015-12-16: qty 40

## 2015-12-16 MED ORDER — BISACODYL 5 MG PO TBEC
10.0000 mg | DELAYED_RELEASE_TABLET | Freq: Every day | ORAL | Status: DC
  Administered 2015-12-17 – 2015-12-21 (×5): 10 mg via ORAL
  Filled 2015-12-16 (×5): qty 2

## 2015-12-16 MED ORDER — 0.9 % SODIUM CHLORIDE (POUR BTL) OPTIME
TOPICAL | Status: DC | PRN
Start: 2015-12-16 — End: 2015-12-16
  Administered 2015-12-16: 5000 mL

## 2015-12-16 MED ORDER — FENTANYL CITRATE (PF) 250 MCG/5ML IJ SOLN
INTRAMUSCULAR | Status: AC
  Filled 2015-12-16: qty 5

## 2015-12-16 MED ORDER — NITROGLYCERIN IN D5W 200-5 MCG/ML-% IV SOLN
0.0000 ug/min | INTRAVENOUS | Status: DC
  Filled 2015-12-16: qty 250

## 2015-12-16 MED ORDER — PROPOFOL 10 MG/ML IV BOLUS
INTRAVENOUS | Status: DC | PRN
  Administered 2015-12-16: 30 mg via INTRAVENOUS

## 2015-12-16 MED ORDER — MAGNESIUM SULFATE 4 GM/100ML IV SOLN
4.0000 g | Freq: Once | INTRAVENOUS | Status: AC
  Administered 2015-12-16: 4 g via INTRAVENOUS
  Filled 2015-12-16: qty 100

## 2015-12-16 MED ORDER — LACTATED RINGERS IV SOLN: INTRAVENOUS | Status: DC

## 2015-12-16 MED ORDER — LATANOPROST 0.005 % OP SOLN
1.0000 [drp] | Freq: Every day | OPHTHALMIC | Status: DC
  Administered 2015-12-16 – 2015-12-21 (×6): 1 [drp] via OPHTHALMIC
  Filled 2015-12-16 (×2): qty 2.5

## 2015-12-16 MED ORDER — DOCUSATE SODIUM 100 MG PO CAPS
200.0000 mg | ORAL_CAPSULE | Freq: Every day | ORAL | Status: DC
  Administered 2015-12-17 – 2015-12-21 (×5): 200 mg via ORAL
  Filled 2015-12-16 (×5): qty 2

## 2015-12-16 MED ORDER — OXYCODONE HCL 5 MG PO TABS
5.0000 mg | ORAL_TABLET | ORAL | Status: DC | PRN
  Administered 2015-12-17 (×2): 5 mg via ORAL
  Filled 2015-12-16 (×3): qty 1

## 2015-12-16 MED ORDER — METOCLOPRAMIDE HCL 5 MG/ML IJ SOLN
10.0000 mg | Freq: Four times a day (QID) | INTRAMUSCULAR | Status: AC
  Administered 2015-12-16 – 2015-12-17 (×4): 10 mg via INTRAVENOUS
  Filled 2015-12-16 (×3): qty 2

## 2015-12-16 MED ORDER — MIDAZOLAM HCL 10 MG/2ML IJ SOLN
INTRAMUSCULAR | Status: AC
  Filled 2015-12-16: qty 2

## 2015-12-16 MED ORDER — ARTIFICIAL TEARS OP OINT
TOPICAL_OINTMENT | OPHTHALMIC | Status: DC | PRN
  Administered 2015-12-16: 1 via OPHTHALMIC

## 2015-12-16 MED ORDER — ACETAMINOPHEN 160 MG/5ML PO SOLN: 650.0000 mg | Freq: Once | ORAL | Status: AC

## 2015-12-16 MED ORDER — MORPHINE SULFATE (PF) 2 MG/ML IV SOLN
2.0000 mg | INTRAVENOUS | Status: DC | PRN
  Administered 2015-12-17 (×4): 2 mg via INTRAVENOUS
  Filled 2015-12-16 (×2): qty 1
  Filled 2015-12-16: qty 2
  Filled 2015-12-16 (×5): qty 1

## 2015-12-16 MED ORDER — PHENYLEPHRINE 40 MCG/ML (10ML) SYRINGE FOR IV PUSH (FOR BLOOD PRESSURE SUPPORT)
PREFILLED_SYRINGE | INTRAVENOUS | Status: AC
  Filled 2015-12-16: qty 10

## 2015-12-16 MED ORDER — LEVOTHYROXINE SODIUM 100 MCG PO TABS
200.0000 ug | ORAL_TABLET | Freq: Every day | ORAL | Status: DC
  Administered 2015-12-17 – 2015-12-22 (×6): 200 ug via ORAL
  Filled 2015-12-16 (×3): qty 2
  Filled 2015-12-16 (×2): qty 1
  Filled 2015-12-16: qty 2
  Filled 2015-12-16 (×2): qty 1

## 2015-12-16 MED ORDER — SODIUM CHLORIDE 0.9 % IJ SOLN
OROMUCOSAL | Status: DC | PRN
  Administered 2015-12-16 (×3): 4 mL via TOPICAL

## 2015-12-16 MED ORDER — SODIUM CHLORIDE 0.9 % IV SOLN
250.0000 mL | INTRAVENOUS | Status: DC
  Administered 2015-12-17: 250 mL via INTRAVENOUS

## 2015-12-16 MED ORDER — GLYCOPYRROLATE 0.2 MG/ML IJ SOLN
INTRAMUSCULAR | Status: AC
  Filled 2015-12-16: qty 1

## 2015-12-16 MED ORDER — WARFARIN - PHYSICIAN DOSING INPATIENT
Freq: Every day | Status: DC
  Administered 2015-12-17: 1
  Administered 2015-12-18: 18:00:00

## 2015-12-16 MED ORDER — PHENYLEPHRINE HCL 10 MG/ML IJ SOLN
0.0000 ug/min | INTRAVENOUS | Status: DC
  Filled 2015-12-16: qty 2

## 2015-12-16 MED ORDER — ALBUMIN HUMAN 5 % IV SOLN
INTRAVENOUS | Status: DC | PRN
  Administered 2015-12-16: 11:00:00 via INTRAVENOUS

## 2015-12-16 MED ORDER — SODIUM CHLORIDE 0.9 % IJ SOLN: 3.0000 mL | Freq: Two times a day (BID) | INTRAMUSCULAR | Status: DC

## 2015-12-16 MED ORDER — BISACODYL 10 MG RE SUPP: 10.0000 mg | Freq: Every day | RECTAL | Status: DC

## 2015-12-16 MED ORDER — METOPROLOL TARTRATE 12.5 MG HALF TABLET
12.5000 mg | ORAL_TABLET | Freq: Two times a day (BID) | ORAL | Status: DC
Start: 2015-12-16 — End: 2015-12-18
  Administered 2015-12-17 (×2): 12.5 mg via ORAL
  Filled 2015-12-16 (×2): qty 1

## 2015-12-16 MED ORDER — METOPROLOL TARTRATE 1 MG/ML IV SOLN: 2.5000 mg | INTRAVENOUS | Status: DC | PRN

## 2015-12-16 MED ORDER — ROCURONIUM BROMIDE 50 MG/5ML IV SOLN
INTRAVENOUS | Status: AC
  Filled 2015-12-16: qty 2

## 2015-12-16 MED ORDER — SODIUM CHLORIDE 0.9 % IV SOLN: INTRAVENOUS | Status: DC

## 2015-12-16 MED ORDER — POTASSIUM CHLORIDE 10 MEQ/50ML IV SOLN: 10.0000 meq | INTRAVENOUS | Status: DC

## 2015-12-16 MED ORDER — ACETAMINOPHEN 650 MG RE SUPP
650.0000 mg | Freq: Once | RECTAL | Status: AC
  Administered 2015-12-16: 650 mg via RECTAL

## 2015-12-16 MED ORDER — POTASSIUM CHLORIDE 10 MEQ/50ML IV SOLN
10.0000 meq | INTRAVENOUS | Status: AC
  Administered 2015-12-16 (×2): 10 meq via INTRAVENOUS

## 2015-12-16 MED ORDER — INSULIN REGULAR HUMAN 100 UNIT/ML IJ SOLN
INTRAMUSCULAR | Status: DC
  Administered 2015-12-16: 2.2 [IU]/h via INTRAVENOUS
  Filled 2015-12-16 (×2): qty 2.5

## 2015-12-16 MED ORDER — CHLORHEXIDINE GLUCONATE 0.12 % MT SOLN
15.0000 mL | OROMUCOSAL | Status: AC
  Administered 2015-12-16: 15 mL via OROMUCOSAL
  Filled 2015-12-16: qty 15

## 2015-12-16 MED ORDER — EPHEDRINE SULFATE 50 MG/ML IJ SOLN
INTRAMUSCULAR | Status: AC
  Filled 2015-12-16: qty 1

## 2015-12-16 MED ORDER — METOPROLOL TARTRATE 25 MG/10 ML ORAL SUSPENSION: 12.5000 mg | Freq: Two times a day (BID) | ORAL | Status: DC

## 2015-12-16 MED ORDER — SODIUM CHLORIDE 0.9 % IV SOLN
INTRAVENOUS | Status: DC | PRN
  Administered 2015-12-16: 08:00:00 via INTRAVENOUS

## 2015-12-16 MED ORDER — ANTISEPTIC ORAL RINSE SOLUTION (CORINZ)
7.0000 mL | Freq: Four times a day (QID) | OROMUCOSAL | Status: DC
  Administered 2015-12-16 – 2015-12-21 (×11): 7 mL via OROMUCOSAL

## 2015-12-16 MED ORDER — ROCURONIUM BROMIDE 100 MG/10ML IV SOLN
INTRAVENOUS | Status: DC | PRN
  Administered 2015-12-16 (×3): 50 mg via INTRAVENOUS

## 2015-12-16 MED ORDER — LAMOTRIGINE 200 MG PO TABS
200.0000 mg | ORAL_TABLET | Freq: Every morning | ORAL | Status: DC
  Administered 2015-12-17 – 2015-12-22 (×6): 200 mg via ORAL
  Filled 2015-12-16 (×6): qty 1

## 2015-12-16 MED ORDER — CHLORHEXIDINE GLUCONATE 0.12% ORAL RINSE (MEDLINE KIT)
15.0000 mL | Freq: Two times a day (BID) | OROMUCOSAL | Status: DC
  Administered 2015-12-16 – 2015-12-18 (×4): 15 mL via OROMUCOSAL

## 2015-12-16 MED ORDER — LACTATED RINGERS IV SOLN: 500.0000 mL | Freq: Once | INTRAVENOUS | Status: DC | PRN

## 2015-12-16 MED ORDER — HEMOSTATIC AGENTS (NO CHARGE) OPTIME
TOPICAL | Status: DC | PRN
  Administered 2015-12-16: 1 via TOPICAL

## 2015-12-16 MED ORDER — DOPAMINE-DEXTROSE 3.2-5 MG/ML-% IV SOLN
3.0000 ug/kg/min | INTRAVENOUS | Status: DC
  Administered 2015-12-16: 3 ug/kg/min via INTRAVENOUS

## 2015-12-16 MED ORDER — ARTIFICIAL TEARS OP OINT
TOPICAL_OINTMENT | OPHTHALMIC | Status: AC
  Filled 2015-12-16: qty 3.5

## 2015-12-16 MED ORDER — MIDAZOLAM HCL 5 MG/5ML IJ SOLN
INTRAMUSCULAR | Status: DC | PRN
  Administered 2015-12-16: 1 mg via INTRAVENOUS
  Administered 2015-12-16: 2 mg via INTRAVENOUS
  Administered 2015-12-16: 1 mg via INTRAVENOUS
  Administered 2015-12-16 (×2): 2 mg via INTRAVENOUS

## 2015-12-16 MED ORDER — PROTAMINE SULFATE 10 MG/ML IV SOLN
INTRAVENOUS | Status: DC | PRN
  Administered 2015-12-16 (×2): 20 mg via INTRAVENOUS
  Administered 2015-12-16 (×2): 10 mg via INTRAVENOUS
  Administered 2015-12-16: 20 mg via INTRAVENOUS
  Administered 2015-12-16: 10 mg via INTRAVENOUS
  Administered 2015-12-16 (×5): 20 mg via INTRAVENOUS
  Administered 2015-12-16: 10 mg via INTRAVENOUS
  Administered 2015-12-16: 20 mg via INTRAVENOUS
  Administered 2015-12-16: 10 mg via INTRAVENOUS

## 2015-12-16 MED ORDER — LACTATED RINGERS IV SOLN
INTRAVENOUS | Status: DC | PRN
  Administered 2015-12-16 (×2): via INTRAVENOUS

## 2015-12-16 MED ORDER — LAMOTRIGINE 100 MG PO TABS
100.0000 mg | ORAL_TABLET | Freq: Every day | ORAL | Status: DC
  Administered 2015-12-16 – 2015-12-21 (×6): 100 mg via ORAL
  Filled 2015-12-16 (×8): qty 1

## 2015-12-16 MED ORDER — MORPHINE SULFATE (PF) 2 MG/ML IV SOLN
1.0000 mg | INTRAVENOUS | Status: AC | PRN
  Administered 2015-12-16 (×2): 2 mg via INTRAVENOUS
  Filled 2015-12-16: qty 1

## 2015-12-16 MED ORDER — PROPOFOL 10 MG/ML IV BOLUS
INTRAVENOUS | Status: AC
  Filled 2015-12-16: qty 40

## 2015-12-16 MED FILL — Electrolyte-R (PH 7.4) Solution: INTRAVENOUS | Qty: 4000 | Status: AC

## 2015-12-16 MED FILL — Sodium Bicarbonate IV Soln 8.4%: INTRAVENOUS | Qty: 50 | Status: AC

## 2015-12-16 MED FILL — Heparin Sodium (Porcine) Inj 1000 Unit/ML: INTRAMUSCULAR | Qty: 10 | Status: AC

## 2015-12-16 MED FILL — Sodium Chloride IV Soln 0.9%: INTRAVENOUS | Qty: 2000 | Status: AC

## 2015-12-16 MED FILL — Mannitol IV Soln 20%: INTRAVENOUS | Qty: 500 | Status: AC

## 2015-12-16 MED FILL — Lidocaine HCl IV Inj 20 MG/ML: INTRAVENOUS | Qty: 10 | Status: AC

## 2015-12-16 MED FILL — Calcium Chloride Inj 10%: INTRAVENOUS | Qty: 10 | Status: AC

## 2015-12-16 SURGICAL SUPPLY — 95 items
ADAPTER CARDIO PERF ANTE/RETRO (ADAPTER) ×3 IMPLANT
ADH SRG 12 PREFL SYR 3 SPRDR (MISCELLANEOUS)
ADPR PRFSN 84XANTGRD RTRGD (ADAPTER) ×2
APPLICATOR COTTON TIP 6IN STRL (MISCELLANEOUS) IMPLANT
BAG DECANTER FOR FLEXI CONT (MISCELLANEOUS) ×3 IMPLANT
BLADE STERNUM SYSTEM 6 (BLADE) ×3 IMPLANT
BLADE SURG 15 STRL LF DISP TIS (BLADE) ×2 IMPLANT
BLADE SURG 15 STRL SS (BLADE) ×3
CANISTER SUCTION 2500CC (MISCELLANEOUS) ×3 IMPLANT
CANNULA EZ GLIDE AORTIC 21FR (CANNULA) ×3 IMPLANT
CANNULA GUNDRY RCSP 15FR (MISCELLANEOUS) ×3 IMPLANT
CATH CPB KIT HENDRICKSON (MISCELLANEOUS) ×3 IMPLANT
CATH HEART VENT LEFT (CATHETERS) ×2 IMPLANT
CATH ROBINSON RED A/P 18FR (CATHETERS) ×6 IMPLANT
CATH THORACIC 36FR RT ANG (CATHETERS) ×6 IMPLANT
CLIP FOGARTY SPRING 6M (CLIP) IMPLANT
CONT SPEC 4OZ CLIKSEAL STRL BL (MISCELLANEOUS) ×1 IMPLANT
COVER MAYO STAND STRL (DRAPES) ×1 IMPLANT
CRADLE DONUT ADULT HEAD (MISCELLANEOUS) ×3 IMPLANT
DEVICE SUT CK QUICK LOAD INDV (Prosthesis & Implant Heart) ×6 IMPLANT
DRAPE SLUSH/WARMER DISC (DRAPES) ×3 IMPLANT
DRSG AQUACEL AG ADV 3.5X14 (GAUZE/BANDAGES/DRESSINGS) ×1 IMPLANT
DRSG COVADERM 4X14 (GAUZE/BANDAGES/DRESSINGS) ×3 IMPLANT
ELECT REM PT RETURN 9FT ADLT (ELECTROSURGICAL) ×6
ELECTRODE REM PT RTRN 9FT ADLT (ELECTROSURGICAL) ×4 IMPLANT
GAUZE SPONGE 4X4 12PLY STRL (GAUZE/BANDAGES/DRESSINGS) ×5 IMPLANT
GLOVE BIO SURGEON STRL SZ 6.5 (GLOVE) ×2 IMPLANT
GLOVE BIO SURGEON STRL SZ7 (GLOVE) ×2 IMPLANT
GLOVE BIO SURGEON STRL SZ8 (GLOVE) ×1 IMPLANT
GLOVE BIOGEL PI IND STRL 6.5 (GLOVE) IMPLANT
GLOVE BIOGEL PI IND STRL 7.0 (GLOVE) IMPLANT
GLOVE BIOGEL PI INDICATOR 6.5 (GLOVE) ×3
GLOVE BIOGEL PI INDICATOR 7.0 (GLOVE) ×3
GLOVE SURG SIGNA 7.5 PF LTX (GLOVE) ×9 IMPLANT
GOWN STRL REUS W/ TWL LRG LVL3 (GOWN DISPOSABLE) ×8 IMPLANT
GOWN STRL REUS W/ TWL XL LVL3 (GOWN DISPOSABLE) ×2 IMPLANT
GOWN STRL REUS W/TWL LRG LVL3 (GOWN DISPOSABLE) ×15
GOWN STRL REUS W/TWL XL LVL3 (GOWN DISPOSABLE) ×3
HEMOSTAT POWDER SURGIFOAM 1G (HEMOSTASIS) ×9 IMPLANT
HEMOSTAT SURGICEL 2X14 (HEMOSTASIS) ×3 IMPLANT
INSERT FOGARTY XLG (MISCELLANEOUS) IMPLANT
KIT BASIN OR (CUSTOM PROCEDURE TRAY) ×3 IMPLANT
KIT DEVICE SUT COR-KNOT MIS 5 (INSTRUMENTS) ×1 IMPLANT
KIT ROOM TURNOVER OR (KITS) ×3 IMPLANT
KIT SUCTION CATH 14FR (SUCTIONS) ×6 IMPLANT
LINE VENT (MISCELLANEOUS) ×1 IMPLANT
NS IRRIG 1000ML POUR BTL (IV SOLUTION) ×17 IMPLANT
PACK OPEN HEART (CUSTOM PROCEDURE TRAY) ×3 IMPLANT
PAD ARMBOARD 7.5X6 YLW CONV (MISCELLANEOUS) ×10 IMPLANT
SET CANNULATION TOURNIQUET (MISCELLANEOUS) ×1 IMPLANT
SET CARDIOPLEGIA MPS 5001102 (MISCELLANEOUS) ×1 IMPLANT
SUT BONE WAX W31G (SUTURE) ×3 IMPLANT
SUT ETHIBON 2 0 V 52N 30 (SUTURE) ×6 IMPLANT
SUT ETHIBON EXCEL 2-0 V-5 (SUTURE) IMPLANT
SUT ETHIBOND 2 0 SH (SUTURE) ×15
SUT ETHIBOND 2 0 SH 36X2 (SUTURE) ×2 IMPLANT
SUT ETHIBOND 2 0 V4 (SUTURE) IMPLANT
SUT ETHIBOND 2 0V4 GREEN (SUTURE) IMPLANT
SUT ETHIBOND 4 0 RB 1 (SUTURE) IMPLANT
SUT ETHIBOND V-5 VALVE (SUTURE) IMPLANT
SUT PROLENE 3 0 SH 1 (SUTURE) IMPLANT
SUT PROLENE 3 0 SH DA (SUTURE) ×3 IMPLANT
SUT PROLENE 4 0 RB 1 (SUTURE) ×15
SUT PROLENE 4-0 RB1 .5 CRCL 36 (SUTURE) ×4 IMPLANT
SUT PROLENE 5 0 C 1 36 (SUTURE) ×5 IMPLANT
SUT PROLENE 6 0 C 1 30 (SUTURE) ×2 IMPLANT
SUT PROLENE 7 0 BV 1 (SUTURE) ×1 IMPLANT
SUT SILK  1 MH (SUTURE) ×4
SUT SILK 1 MH (SUTURE) ×2 IMPLANT
SUT SILK 1 TIES 10X30 (SUTURE) ×1 IMPLANT
SUT SILK 2 0 SH CR/8 (SUTURE) ×2 IMPLANT
SUT SILK 2 0 TIES 10X30 (SUTURE) ×1 IMPLANT
SUT SILK 2 0 TIES 17X18 (SUTURE) ×3
SUT SILK 2-0 18XBRD TIE BLK (SUTURE) IMPLANT
SUT SILK 3 0 SH CR/8 (SUTURE) ×1 IMPLANT
SUT SILK 4 0 TIE 10X30 (SUTURE) ×2 IMPLANT
SUT STEEL 6MS V (SUTURE) IMPLANT
SUT STEEL SZ 6 DBL 3X14 BALL (SUTURE) IMPLANT
SUT TEM PAC WIRE 2 0 SH (SUTURE) ×4 IMPLANT
SUT VIC AB 1 CTX 36 (SUTURE) ×6
SUT VIC AB 1 CTX36XBRD ANBCTR (SUTURE) ×4 IMPLANT
SUT VIC AB 2-0 CTX 27 (SUTURE) ×2 IMPLANT
SUT VIC AB 3-0 X1 27 (SUTURE) ×2 IMPLANT
SUTURE E-PAK OPEN HEART (SUTURE) ×2 IMPLANT
SYR 10ML KIT SKIN ADHESIVE (MISCELLANEOUS) IMPLANT
SYSTEM SAHARA CHEST DRAIN ATS (WOUND CARE) ×3 IMPLANT
TAPE CLOTH SURG 4X10 WHT LF (GAUZE/BANDAGES/DRESSINGS) ×1 IMPLANT
TOWEL OR 17X24 6PK STRL BLUE (TOWEL DISPOSABLE) ×6 IMPLANT
TOWEL OR 17X26 10 PK STRL BLUE (TOWEL DISPOSABLE) ×6 IMPLANT
TRAY FOLEY IC TEMP SENS 16FR (CATHETERS) ×3 IMPLANT
UNDERPAD 30X30 INCONTINENT (UNDERPADS AND DIAPERS) ×3 IMPLANT
VALVE SYSTEM EDWS INTUITY 19A (Prosthesis & Implant Heart) ×1 IMPLANT
VALVE SYSTEM EDWS INTUITY 21A (Prosthesis & Implant Heart) ×1 IMPLANT
VENT LEFT HEART 12002 (CATHETERS) ×3
WATER STERILE IRR 1000ML POUR (IV SOLUTION) ×6 IMPLANT

## 2015-12-16 NOTE — OR Nursing (Signed)
First call to SICU charge nurse at 1049.

## 2015-12-16 NOTE — OR Nursing (Signed)
Second call to SICU charge nurse at 1110.

## 2015-12-16 NOTE — Anesthesia Postprocedure Evaluation (Signed)
Anesthesia Post Note  Patient: Maureen Keller  Procedure(s) Performed: Procedure(s) (LRB): AORTIC VALVE REPLACEMENT (AVR) (N/A) TRANSESOPHAGEAL ECHOCARDIOGRAM (TEE) (N/A)  Patient location during evaluation: SICU Anesthesia Type: General Level of consciousness: patient remains intubated per anesthesia plan Pain management: pain level controlled Vital Signs Assessment: post-procedure vital signs reviewed and stable Respiratory status: patient remains intubated per anesthesia plan and patient on ventilator - see flowsheet for VS Cardiovascular status: stable Anesthetic complications: no    Last Vitals:  Filed Vitals:   12/16/15 0610  BP: 154/46  Pulse: 60  Temp: 36.7 C  Resp: 18    Last Pain: There were no vitals filed for this visit.               Ainhoa Rallo,JAMES TERRILL

## 2015-12-16 NOTE — Progress Notes (Signed)
Utilization Review Completed.Maureen Keller T12/30/2016  

## 2015-12-16 NOTE — Interval H&P Note (Signed)
History and Physical Interval Note:  12/16/2015 7:13 AM  Maureen Keller  has presented today for surgery, with the diagnosis of AS  The various methods of treatment have been discussed with the patient and family. After consideration of risks, benefits and other options for treatment, the patient has consented to  Procedure(s): AORTIC VALVE REPLACEMENT (AVR) (N/A) TRANSESOPHAGEAL ECHOCARDIOGRAM (TEE) (N/A) as a surgical intervention .  The patient's history has been reviewed, patient examined, no change in status, stable for surgery.  I have reviewed the patient's chart and labs.  Questions were answered to the patient's satisfaction.     Melrose Nakayama

## 2015-12-16 NOTE — Procedures (Signed)
Extubation Procedure Note  Patient Details:   Name: Maureen Keller DOB: [birth year] MRN: [id number]   Airway Documentation:     Evaluation  O2 sats: stable throughout Complications: No apparent complications Patient did tolerate procedure well.    Yes pt able to vocalize.   Pt extubated at this time per Rapid wean protocol. NIF -20cm H2O, VC 0.85L. Pt able to breathe around deflated cuff. VS stable at this time. No stridor noted. Pt has strong, adequate cough.   Irineo Axon [facility] 12/16/2015, 6:50 PM

## 2015-12-16 NOTE — Op Note (Signed)
Maureen Keller, Maureen Keller                ACCOUNT NO.:  [id number]  MEDICAL RECORD NO.:  [id number]  LOCATION:  2S03C                        FACILITY:  [facility]  PHYSICIAN:  Revonda Standard. Roxan Hockey, M.D.DATE OF BIRTH:  [birth year]  DATE OF PROCEDURE:  12/16/2015 DATE OF DISCHARGE:                              OPERATIVE REPORT   PREOPERATIVE DIAGNOSIS:  Severe aortic stenosis and moderately severe aortic insufficiency.  POSTOPERATIVE DIAGNOSIS: Severe aortic stenosis and moderately severe aortic insufficiency.  PROCEDURE:   Median sternotomy, extracorporeal circulation Aortic valve replacement with 19 mm Edwards Intuity\ Elite aortic valve (Model # [id number], Serial # [id number]).  SURGEON:  Revonda Standard. Roxan Hockey, M.D.  ASSISTANT:  Ellwood Handler, PA-C.  ANESTHESIA:  General.  FINDINGS:   Severe aortic stenosis and moderately severe aortic insufficiency on prebypass transesophageal echocardiography. 2+ mitral regurgitation. Left ventricular hypertrophy. Preserved left ventricular systolic function.   Fusion of left and non-coronary cusps.   Left main orifice near the left coronary/ non-coronary commissure.   21 mm valve did not seat properly.  19 mm valve seated well with no perivalvular leaks.   Peak gradient of 14 mmHg and mean gradient of 7 mmHg post valve replacement.  CLINICAL NOTE:  Maureen Keller is a 69 year old woman with known aortic stenosis and aortic insufficiency.  She recently has developed a progressively worsening exertional dyspnea.  An echocardiogram showed worsening of her aortic stenosis and aortic insufficiency.  Her mean gradient was calculated at 43 mmHg and peak gradient at 78 mmHg. At catheterization, she had normal coronary arteries.  Given the progression of her aortic valve disease and worsening symptoms, she was advised to undergo aortic valve replacement.  The indications, risks, benefits, and alternatives were discussed in detail with the patient. She understood  and accepted the risks and agreed to proceed.  OPERATIVE NOTE:  Maureen Keller was brought to the preoperative holding area on December 16, 2015.  Anesthesia placed a Swan-Ganz catheter and an arterial blood pressure monitoring line.  She was taken to the operating room, anesthetized, and intubated.  Transesophageal echocardiography was performed by Dr. Rica Koyanagi of the Anesthesia Service.  A Foley catheter was placed.  The chest, abdomen, and legs were prepped and draped in the usual sterile fashion.    A median sternotomy was performed.  Hemostasis was achieved.  A sternal retractor was placed and gently opened over time.  The pericardium was opened.  The patient was fully heparinized.  After confirming adequate anticoagulation with ACT measurement, the aorta was cannulated via concentric 2-0 Ethibond pledgeted pursestring sutures.  A dual-stage venous cannula was placed via a pursestring suture in the right atrial appendage.  Cardiopulmonary bypass was initiated and the patient was cooled to 34 degrees Celsius. A retrograde cardioplegia cannula was placed via a pursestring suture in the right atrial appendage and directed into the coronary sinus.  A left ventricular vent was placed via a pursestring suture in the right superior pulmonary vein and directed into the left ventricle.  A temperature probe was placed in myocardial septum.  A cardioplegia cannula was placed in the ascending aorta.  The aorta was crossclamped.  The left ventricle was emptied via the vents, cardiac arrest  then was achieved with a combination of cold antegrade and retrograde blood cardioplegia and topical iced saline.  An initial 200 mL of cardioplegia was administered antegrade, but we were unable to get a sufficient root pressure, therefore the remainder of the cardioplegia was given retrograde.  1 L of cardioplegia was administered in this fashion.  There was a diastolic arrest after approximately 500 mL of  cardioplegia. There was septal cooling to 10 degrees Celsius.  Additional doses of cardioplegia were administered at 15-20 minute intervals throughout the cross-clamp period.  An aortotomy was made and extended into the noncoronary sinus.  The valve leaflets were inspected.  There was calcification of the leaflets, which had limited mobility and did not coalesce centrally.  The left and non coronary cusps were partially fused.  The leaflets were excised.  There was moderate annular calcification.  This was debrided.  The annulus was sized.  The 21 sizer did go into the annulus.  3 annular sutures then were placed, rotated slightly from the nadir of the commissures to prevent the valve post from blocking the left main coronary.  A 21-mm Edwards Intuity Elite valve was prepared per the [employer].  When the valve had been sufficiently rinsed, the annular sutures were placed through the sewing ring of the valve, which was then lowered into place.  This was relatively tight fit, but it did feel as if it went through the annulus.  The annular sutures were secured with Cor-knots.  The balloon then was advanced and inflated to 4.5 atmospheres for 10 seconds.  The balloon was deflated, and the deployment device was removed.  The valve was inspected.  There was no impingement on the coronary ostia.  However, the valve was not completely seated on the non coronary annulus and a portion of the annulus was visible below the stent.  The valve was excised by cutting the annular sutures.  The decision was made to proceed with a 19 mm valve.  This would provide similar hemodynamics to a 21 mm magna ease valve.  Annular sutures were again placed after rinsing the 19 mm valve. These were passed through the sewing ring of the valve.  The valve was lowered into place.  The Cor-knot was again used to secure the annular sutures.  The balloon was advanced and deployed in a similar  fashion. The delivery device then was removed.  The valve was inspected.  It was well seated.  There was no impingement on the coronary ostia.  No perivalvular leaks were apparent. Rewarming was begun.  The aortotomy was closed in 2 layers with a running 4-0 Prolene suture.  The first layer was a running horizontal mattress suture.  The second layer was a running simple suture.  De-airing was performed before tying the suture on the first layer.  The patient had just recently received a dose of retrograde cardioplegia.  She was placed in Trendelenburg position. Additional de-airing maneuvers were performed and the aortic crossclamp was removed.  The total crossclamp time was 74 minutes.  The patient did require single defibrillation with 10 joules.  While rewarming was completed, the aortotomy was inspected for hemostasis.  Epicardial pacing wires were placed on the right ventricle and right atrium.  The patient was in complete heart block and DOO pacing was initiated.  The retrograde cardioplegia cannula was removed. The left ventricular vent was removed, performing additional de-airing maneuvers with removal.  When the patient had rewarmed to a core temperature of 37 degrees  Celsius, she was weaned from cardiopulmonary bypass on the first attempt without difficulty.  The total bypass time was 104 minutes.  The initial cardiac index was greater than 2 L/minute/m2.  Postbypass transesophageal echocardiography showed good function of the prosthetic valve.  No perivalvular leaks were seen. Gradients were measured via the deep transgastric views showed mean gradient of 7 mmHg and a peak gradient of 14 mmHg.  Left ventricular function was well-preserved with the exception of some septal dyskinesis, consistent with ventricular placing.  A test dose of protamine was administered and was well tolerated.  The atrial and aortic cannulae were removed, as was the aortic root vent.  The remainder  of the protamine was administered without incident.  The chest was irrigated with warm saline.  Hemostasis was achieved.  The pericardium was reapproximated over the aorta and base of the heart with interrupted 3-0 silk sutures.  A 36-French chest tube and 28-French Blake drain were placed in the mediastinum through separate subcostal incisions.  The sternum was closed with a combination of single and double heavy gauge stainless steel wires.  The pectoralis fascia, subcutaneous tissue, and skin were closed in standard fashion.  All sponge, needle, and instrument counts were correct at the end of the procedure.  The patient was taken from the operating room to the surgical intensive care unit in good condition.     Revonda Standard Roxan Hockey, M.D.     SCH/MEDQ  D:  12/16/2015  T:  12/16/2015  Job:  [id number]

## 2015-12-16 NOTE — Brief Op Note (Addendum)
12/16/2015  10:22 AM  PATIENT:  Maureen Keller  69 y.o. female  PRE-OPERATIVE DIAGNOSIS:  Aortic Stenosis and Aortic Insuffiency  POST-OPERATIVE DIAGNOSIS:  Aortic Stenosis and Aortic Insuffiency  PROCEDURE:  Procedure(s) with comments:  AORTIC VALVE REPLACEMENT (AVR)  - Edwards Intuity Elite Aortic Valve 39mm (Model # 8300AB, Serial # [id number])  TRANSESOPHAGEAL ECHOCARDIOGRAM (TEE) (N/A)  SURGEON:  Surgeon(s) and Role:    * Melrose Nakayama, MD - Primary  PHYSICIAN ASSISTANT: Ellwood Handler PA-C  ANESTHESIA:   general  EBL:  Total I/O In: -  Out: 250 [Urine:250]  BLOOD ADMINISTERED: CELLSAVER  DRAINS: Mediastinal Chest Drains   LOCAL MEDICATIONS USED:  NONE  SPECIMEN:  Source of Specimen:  Aortic Valve Leaflets  DISPOSITION OF SPECIMEN:  PATHOLOGY  COUNTS:  YES  TOURNIQUET:  * No tourniquets in log *  DICTATION: .Dragon Dictation  PLAN OF CARE: Admit to inpatient   PATIENT DISPOSITION:  ICU - intubated and hemodynamically stable.   Delay start of Pharmacological VTE agent (>24hrs) due to surgical blood loss or risk of bleeding: yes   Aortic Valve Etiology   Aortic Insufficiency:  Severe  Aortic Valve Disease:  Yes.  Aortic Stenosis:  Yes. Smallest Aortic Valve Area: 1.32 cm2; Highest Mean Gradient: 78 mmHg.  Etiology (Choose at least one and up to  5 etiologies):  Degenerative - Calcified    Aortic Valve  Procedure Performed:  Replacement: Yes.  Bioprosthetic Valve. Implant Model Number:8300AB, Size:19, Unique Device Identifier:[id number]  Repair/Reconstruction: No.   Aortic Annular Enlargement: No.    POSTBYPASS TEE: peak gradient 14, mean 7. No paravalvular leaks.

## 2015-12-16 NOTE — H&P (View-Only) (Signed)
PCP is Kandice Hams, MD Referring Provider is Lauree Chandler D*/ Fransico Him- Primary cardiologist  Chief Complaint  Patient presents with  . Aortic Stenosis    eval for AVR...ECHO 11/25/15, CATHED 11/30/15  . Shortness of Breath    HPI: 69 year old woman with known aortic stenosis and aortic insufficiency who presents with a chief complaint of shortness of breath with exertion.  Maureen Keller is a 69 y.o. woman with a history of hypertension, moderate AR and AS, moderate MR, mild pulmonary hypertension, and grade II diastolic dysfunction. She recently saw Dr. Radford Pax for a scheduled follow-up visit. At that visit she complained of chest tightness and shortness of breath with exertion. This had been present for some time but had worsened just before she saw Dr. Radford Pax. A repeat echocardiogram showed progression from moderate to severe aortic stenosis and from moderate to moderately severe aortic insufficiency. She had only mild mitral regurgitation. Her peak gradient was 78 mmHg and mean gradient was 43 mmHg. She then underwent cardiac catheterization which revealed normal coronary arteries. Her gradients did not measure his high with a 11 mm mean gradient. Her valve area was calculated at 1.02 cm.  She had previously been walking about 3 miles a day. Over the past year that had cut down to 2 miles a day. She recently has noted the shortness of breath coming on with even less exertion such as walking from the parking lot into [employer]. She has not had any rest or nocturnal symptoms. She denies swelling in her legs, orthopnea, and paroxysmal nocturnal dyspnea. She denies any syncope or presyncope.  Past Medical History  Diagnosis Date  . Mitral valve disorder     moderate MR on echo 07/2014  . Depression     PTSD  . Hyperlipidemia   . Aortic stenosis      moderate aortic insufficiency and moderate  aortic stenosis by echo 07/2014  . Hypothyroidism   . CHF (congestive heart failure) (HCC)      /Diastolic dysfunction  . Pulmonary hypertension ([place])   . Glaucoma   . Chronic kidney disease (CKD), stage III (moderate)   . AR (aortic regurgitation)   . Hypertension     Past Surgical History  Procedure Laterality Date  . Cataract extraction    . Cardiac catheterization  11/04/2009    normal coronaries  . Carpal tunnel release  1982  . Cardiac catheterization N/A 11/30/2015    Procedure: Right/Left Heart Cath and Coronary Angiography;  Surgeon: Burnell Blanks, MD;  Location: [facility];  Service: Cardiovascular;  Laterality: N/A;    Family History  Problem Relation Age of Onset  . Hypertension Father   . Heart disease Father   . Diabetes Father   . Diabetes Mother   . Suicidality Mother 77    Social History Social History  Substance Use Topics  . Smoking status: Former Smoker    Quit date: 10/29/1976  . Smokeless tobacco: Never Used  . Alcohol Use: Yes     Comment: Occ    Current Outpatient Prescriptions  Medication Sig Dispense Refill  . amLODipine (NORVASC) 2.5 MG tablet TAKE 1 TABLET DAILY. 30 tablet 6  . lamoTRIgine (LAMICTAL) 100 MG tablet Take 100-200 mg by mouth 2 (two) times daily. 200mg  in the morning and 100mg  in the evening    . latanoprost (XALATAN) 0.005 % ophthalmic solution Place 1 drop into both eyes at bedtime.    Marland Kitchen levothyroxine (SYNTHROID, LEVOTHROID) 200 MCG tablet Take 200 mcg  by mouth daily.    Marland Kitchen losartan (COZAAR) 100 MG tablet Take 100 mg by mouth daily.     . metoprolol succinate (TOPROL-XL) 25 MG 24 hr tablet Take 1 tablet (25 mg total) by mouth daily. 30 tablet 11  . Red Yeast Rice 600 MG CAPS Take 1 capsule by mouth daily.     . traZODone (DESYREL) 100 MG tablet Take 200 mg by mouth at bedtime.     . Multiple Vitamin (MULTIVITAMIN) tablet Take 1 tablet by mouth daily.     No current facility-administered medications for this visit.    Allergies  Allergen Reactions  . Risperdal [Risperidone]     sever mental and  physical degeneration     Review of Systems  Constitutional: Positive for activity change. Negative for fever, appetite change and unexpected weight change.  HENT: Positive for hearing loss. Negative for congestion, dental problem and nosebleeds.   Eyes: Positive for visual disturbance (Glaucoma, floaters).  Respiratory: Positive for cough and shortness of breath. Negative for wheezing.   Cardiovascular: Positive for chest pain and palpitations. Negative for leg swelling.  Gastrointestinal: Positive for constipation. Negative for abdominal pain and blood in stool.  Genitourinary: Negative for dysuria and hematuria.  Neurological: Positive for dizziness. Negative for syncope, weakness and headaches.  Hematological: Does not bruise/bleed easily.  All other systems reviewed and are negative.   BP 188/71 mmHg  Pulse 72  Resp 16  Ht 5\' 5"  (1.651 m)  Wt 175 lb (79.379 kg)  BMI 29.12 kg/m2  SpO2 98% Physical Exam  Constitutional: She is oriented to person, place, and time. No distress.  Obese  HENT:  Head: Normocephalic and atraumatic.  Mouth/Throat: No oropharyngeal exudate.  Eyes: Conjunctivae and EOM are normal. No scleral icterus.  Neck: Neck supple. No thyromegaly present.  Transmitted murmur bilaterally  Cardiovascular: Normal rate, regular rhythm and intact distal pulses.   Murmur (3/6 systolic with faint diastolic component) heard. Pulmonary/Chest: Effort normal and breath sounds normal. No respiratory distress. She has no wheezes. She has no rales.  Abdominal: Soft. Bowel sounds are normal. She exhibits no distension. There is no tenderness.  Musculoskeletal: Normal range of motion. She exhibits no edema.  Lymphadenopathy:    She has no cervical adenopathy.  Neurological: She is alert and oriented to person, place, and time. No cranial nerve deficit. She exhibits normal muscle tone. Coordination normal.  Skin: Skin is warm and dry.  Vitals reviewed.    Diagnostic  Tests: Echocardiogram 11/25/2015 Study Conclusions  - Left ventricle: The cavity size was normal. Systolic function was vigorous. The estimated ejection fraction was in the range of 65% to 70%. Wall motion was normal; there were no regional wall motion abnormalities. Features are consistent with a pseudonormal left ventricular filling pattern, with concomitant abnormal relaxation and increased filling pressure (grade 2 diastolic dysfunction). - Aortic valve: Trileaflet; moderately thickened, moderately calcified leaflets. (Left and non coronary cusp are demonstrating predominant calcification) Cusp separation was reduced. There was severe stenosis. There was moderate to severe regurgitation. Peak velocity (S): 442 cm/s. Mean gradient (S): 43 mm Hg. Peak gradient (S): 78 mm Hg. - Mitral valve: There was mild regurgitation. - Pulmonary arteries: Systolic pressure was mildly increased. PA peak pressure: 37 mm Hg (S).  Impressions:  - When compared to prior, aortic stenosis is now severe. Aortic regurgitation appears worsened as well. EF remains normal.  Cardiac catheterization 11/30/2015 1. No angiographic evidence of CAD 2. Severe aortic valve stenosis by echo (Cath data  with peak to peak gradient 14 mm Hg, mean gradient 13.7 mm Hg, AVA 1.03 cm2).  3. Mild elevation filling pressures.   Recommendations: Will discuss with Dr. Radford Pax. She appears to have severe AS with moderate AI by echo. She is relatively young and very active. She appears to be a good candidate for AVR by conventional approach. I will place a consult to our CT surgery division to discuss AVR.   Impression: 69 year old woman with severe aortic stenosis and moderately severe aortic regurgitation with accelerating exertional angina. Aortic valve replacement is indicated in the setting of severe aortic stenosis with symptoms for survival benefit and symptom relief.  I discussed the surgical  options with Mrs. Kasik. We discussed tissue versus mechanical valves. We discussed the relative advantages and disadvantages for both of those approaches. Given her age a tissue valve would be the appropriate selection. She had questions about transcatheter valves, but she does not fit the risk category for that procedure.  I have discussed the general nature of the procedure, the need for general anesthesia, and the incision to be used with Mrs. Hinks and her healthcare POA. We discussed the expected hospital stay, overall recovery and short and long term outcomes.  I reviewed the indications, risks, benefits, and alternatives. She understands the risks include, but are not limited to death, stroke, MI, DVT/PE, bleeding, possible need for transfusion, infections, cardiac arrhythmias, heart block requiring pacemaker placement, and other organ system dysfunction including  respiratory, renal, or GI complications.   She accepts the risks and agrees to proceed.  Plan:  Aortic valve replacement on Friday, 12/16/2015  Melrose Nakayama, MD [facility] [phone]

## 2015-12-16 NOTE — Transfer of Care (Signed)
Immediate Anesthesia Transfer of Care Note  Patient: Maureen Keller  Procedure(s) Performed: Procedure(s) with comments: AORTIC VALVE REPLACEMENT (AVR) (N/A) - Edwards Intuity Elite Aortic Valve 60mm TRANSESOPHAGEAL ECHOCARDIOGRAM (TEE) (N/A)  Patient Location: SICU  Anesthesia Type:General  Level of Consciousness: Patient remains intubated per anesthesia plan  Airway & Oxygen Therapy: Patient remains intubated per anesthesia plan  Post-op Assessment: Report given to RN and Post -op Vital signs reviewed and stable  Post vital signs: Reviewed and stable  Last Vitals:  Filed Vitals:   12/16/15 0610  BP: 154/46  Pulse: 60  Temp: 36.7 C  Resp: 18    Complications: No apparent anesthesia complications

## 2015-12-16 NOTE — Op Note (Signed)
NAMEALAYJAH, Maureen Keller NO.:  [id number]  MEDICAL RECORD NO.:  [id number]  LOCATION:  2S03C                        FACILITY:  [facility]  PHYSICIAN:  Ala Dach, M.D.DATE OF BIRTH:  08/18/1946  DATE OF PROCEDURE:  12/16/2015 DATE OF DISCHARGE:                              OPERATIVE REPORT   INDICATIONS FOR PROCEDURE:  Ms. Ewbank is a 69 year old female, patient of Dr. Modesto Charon who presents today for aortic valve replacement.  DESCRIPTION OF PROCEDURE:  She is brought to the holding area in the morning of surgery where under local anesthesia with sedation a Pulmonary artery and radial arterial lines were placed.  She is then moved to the OR for routine induction of general anesthesia, after which the TEE probe is prepared, then passed oropharyngeally into the stomach, then slightly withdrawn for imaging of the cardiac structures.  PRE-CARDIOPULMONARY BYPASS TEE EXAMINATION:  Left ventricle:  The left ventricular chamber is seen initially in the short axis view.  There is a normal wall thickness.  Normal chamber size noted in the left ventricular chamber.  It is vigorous in its contractile pattern.  All segmental walls are normally contractile and move inward vigorously.  Estimated ejection fraction is estimated to be greater than 60%.  Papillary muscles are well outlined and seen especially in the long-axis view.  No other masses are appreciated.  Mitral valve:  The mitral valve apparatus is seen initially in the four- chamber view.  Of note, both anterior and posterior leaflets are moderately thickened.  There is some slight decrease in the overall excursion of the leaflets .  Multiple views are obtained. Especially noted in the commissural view, we could see 2 flame jets of mitral regurgitant flow due to the areas of the lack of coaptation from the anterior and posterior leaflets.  This was more than trivial, but just likely to be considered mild  overall.  Overall assessment is mild MR.  Aortic valve:  Aortic valve is seen initially in the short axis view. There are 3 cusps.  The cusps have limited mobility such that the raphe portion of the left and noncoronary cusp appeared to be fused.  This creates an  area of immobility between these 2 cusps.  The right coronary cusp is noted and appears to be opening well.  However, on coaptation, there is a large area of lack of coaptation during diastole.  Vena contracta at this level measured about 0.65 cm consistent with upper limits of moderate to severe aortic insufficiency.  A long-axis view across the mitral valve again shows the calcification at the aortic root level itself, the lack of mobility of that non-coronary cusp especially. Color Doppler in the long-axis view shows a fairly large jet that fills about 75% of the left ventricular outflow again consistent with severe aortic insufficiency.  Multiple views were carried out, overall consistent with mild aortic stenosis and severe aortic insufficiency.  Left atrium:  Normal left atrial chamber is appreciated.  The appendage is visualized and is clear.  The interatrial septum is interrogated and is intact.  Right atrium:  Normal right atrial chamber is appreciated.  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  Right ventricle:  Right ventricular chamber is seen initially in the four-chamber view.  There is normal wall thickness.  Normal contractility is appreciated.  The patient is placed on cardiopulmonary bypass and the aortotomy is performed.  The diseased aortic valve is excised and replaced with a #19 tissue valve into its valve.  This is secured in place.  De-airing maneuvers were carried out.  The patient has rewarmed and separated from cardiopulmonary bypass with the initial attempt.  POST CARDIOPULMONARY BYPASS TEE EXAMINATION:  (Limited exam).  Left ventricle:  The  left ventricular chamber is visualized in the early post bypass period.  There is overall satisfactory contractility.  There is dyssynergy noted in the septal wall area of the left ventricular chamber.  This is fairly common after the bypass and did appear to improve with time and separation of cardiopulmonary bypass.  Aortic valve:  The aortic valve now is visualized in multiple views. The new trileaflet tissue valve is seen in the aortic position.  It appears to be seated well.  All 3 leaflets are well visualized and appeared to be opening satisfactorily during systolic outflow.  On diastole, the leaflets themselves close appropriately.  On both long and short axis views and with color Doppler, we were able to visualize no extraneous jets.  Overall, this appears to be a satisfactory placement of the 3 leaflet tissue valve in the aortic position.  Rest of the cardiac examination was as previously described without significant changes.  The patient was ultimately returned to the cardiac intensive care unit in stable condition.          ______________________________ Ala Dach, M.D.     JTM/MEDQ  D:  12/16/2015  T:  12/16/2015  Job:  [id number]

## 2015-12-16 NOTE — Progress Notes (Signed)
CT surgery p.m. Rounds  Patient resting comfortably on ventilator after aVR with intuitive 19 mm valve Paced rhythm with stable hemodynamics Tolerating pressure support ventilator wean

## 2015-12-16 NOTE — Progress Notes (Signed)
RT note- initiated SICU wean.

## 2015-12-16 NOTE — Progress Notes (Signed)
  Echocardiogram Echocardiogram Transesophageal has been performed.  Darlina Sicilian M 12/16/2015, 8:40 AM

## 2015-12-16 NOTE — Anesthesia Procedure Notes (Addendum)
Central Venous Catheter Insertion Performed by: anesthesiologist Patient location: Pre-op. Preanesthetic checklist: patient identified, IV checked, site marked, risks and benefits discussed, surgical consent, monitors and equipment checked, pre-op evaluation, timeout performed and anesthesia consent Lidocaine 1% used for infiltration Landmarks identified Catheter size: 8.5 Fr Central line was placed.Sheath introducer Procedure performed using ultrasound guided technique. Attempts: 1 Following insertion, line sutured and dressing applied. Post procedure assessment: blood return through all ports, free fluid flow and no air. Patient tolerated the procedure well with no immediate complications.    Central Venous Catheter Insertion Performed by: anesthesiologist Patient location: Pre-op. Preanesthetic checklist: patient identified, IV checked, site marked, risks and benefits discussed, surgical consent, monitors and equipment checked, pre-op evaluation, timeout performed and anesthesia consent Landmarks identified PA cath was placed.Swan type and PA catheter depth:thermodilationProcedure performed using ultrasound guided technique. Attempts: 1 Patient tolerated the procedure well with no immediate complications.    Procedure Name: Intubation Date/Time: 12/16/2015 7:25 AM Performed by: Layla Maw Pre-anesthesia Checklist: Patient identified, Patient being monitored, Timeout performed, Emergency Drugs available and Suction available Patient Re-evaluated:Patient Re-evaluated prior to inductionOxygen Delivery Method: Circle System Utilized Preoxygenation: Pre-oxygenation with 100% oxygen Intubation Type: IV induction Ventilation: Mask ventilation without difficulty Laryngoscope Size: Miller and 3 Grade View: Grade I Tube type: Oral Tube size: 8.0 mm Number of attempts: 1 Airway Equipment and Method: Stylet Placement Confirmation: ETT inserted through vocal cords under  direct vision,  positive ETCO2 and breath sounds checked- equal and bilateral Secured at: 22 cm Tube secured with: Tape Dental Injury: Teeth and Oropharynx as per pre-operative assessment

## 2015-12-16 NOTE — Plan of Care (Signed)
Problem: Phase II - Intermediate Post-Op Goal: Wean to Extubate Outcome: Completed/Met Date Met:  12/16/15 Weaned to extubation after 6 1/2 hrs to [place]. Pt able to adequately maintain airways and perform C/DB exercises

## 2015-12-17 ENCOUNTER — Inpatient Hospital Stay (HOSPITAL_COMMUNITY): Payer: Medicare Other

## 2015-12-17 LAB — MAGNESIUM
Magnesium: 2.3 mg/dL (ref 1.7–2.4)
Magnesium: 2 mg/dL (ref 1.7–2.4)

## 2015-12-17 LAB — GLUCOSE, CAPILLARY
GLUCOSE-CAPILLARY: 93 mg/dL (ref 65–99)
GLUCOSE-CAPILLARY: 106 mg/dL — AB (ref 65–99)
GLUCOSE-CAPILLARY: 105 mg/dL — AB (ref 65–99)
GLUCOSE-CAPILLARY: 135 mg/dL — AB (ref 65–99)
GLUCOSE-CAPILLARY: 183 mg/dL — AB (ref 65–99)
Glucose-Capillary: 101 mg/dL — ABNORMAL HIGH (ref 65–99)
Glucose-Capillary: 96 mg/dL (ref 65–99)
Glucose-Capillary: 111 mg/dL — ABNORMAL HIGH (ref 65–99)

## 2015-12-17 LAB — POCT I-STAT, CHEM 8
BUN: 11 mg/dL (ref 6–20)
CHLORIDE: 101 mmol/L (ref 101–111)
Calcium, Ion: 1.28 mmol/L (ref 1.13–1.30)
Creatinine, Ser: 0.9 mg/dL (ref 0.44–1.00)
GLUCOSE: 140 mg/dL — AB (ref 65–99)
HEMATOCRIT: 31 % — AB (ref 36.0–46.0)
HEMOGLOBIN: 10.5 g/dL — AB (ref 12.0–15.0)
POTASSIUM: 4.6 mmol/L (ref 3.5–5.1)
SODIUM: 135 mmol/L (ref 135–145)
TCO2: 22 mmol/L (ref 0–100)

## 2015-12-17 LAB — CBC
HCT: 31 % — ABNORMAL LOW (ref 36.0–46.0)
HEMATOCRIT: 30.5 % — AB (ref 36.0–46.0)
HEMOGLOBIN: 10.3 g/dL — AB (ref 12.0–15.0)
Hemoglobin: 10.8 g/dL — ABNORMAL LOW (ref 12.0–15.0)
MCH: 31.2 pg (ref 26.0–34.0)
MCH: 32.5 pg (ref 26.0–34.0)
MCHC: 33.8 g/dL (ref 30.0–36.0)
MCHC: 34.8 g/dL (ref 30.0–36.0)
MCV: 92.4 fL (ref 78.0–100.0)
MCV: 93.4 fL (ref 78.0–100.0)
PLATELETS: 144 10*3/uL — AB (ref 150–400)
Platelets: 141 K/uL — ABNORMAL LOW (ref 150–400)
RBC: 3.3 MIL/uL — ABNORMAL LOW (ref 3.87–5.11)
RBC: 3.32 MIL/uL — ABNORMAL LOW (ref 3.87–5.11)
RDW: 13 % (ref 11.5–15.5)
RDW: 13.2 % (ref 11.5–15.5)
WBC: 9.1 10*3/uL (ref 4.0–10.5)
WBC: 11.5 K/uL — ABNORMAL HIGH (ref 4.0–10.5)

## 2015-12-17 LAB — BASIC METABOLIC PANEL
ANION GAP: 8 (ref 5–15)
BUN: 10 mg/dL (ref 6–20)
CO2: 21 mmol/L — ABNORMAL LOW (ref 22–32)
Calcium: 8.8 mg/dL — ABNORMAL LOW (ref 8.9–10.3)
Chloride: 110 mmol/L (ref 101–111)
Creatinine, Ser: 0.8 mg/dL (ref 0.44–1.00)
GFR calc Af Amer: >60 mL/min (ref >60)
GLUCOSE: 114 mg/dL — AB (ref 65–99)
POTASSIUM: 4.3 mmol/L (ref 3.5–5.1)
Sodium: 139 mmol/L (ref 135–145)

## 2015-12-17 LAB — CREATININE, SERUM
Creatinine, Ser: 1.07 mg/dL — ABNORMAL HIGH (ref 0.44–1.00)
GFR, EST AFRICAN AMERICAN: 60 mL/min — AB (ref >60)
GFR, EST NON AFRICAN AMERICAN: 52 mL/min — AB (ref >60)

## 2015-12-17 MED ORDER — INSULIN ASPART 100 UNIT/ML ~~LOC~~ SOLN
0.0000 [IU] | SUBCUTANEOUS | Status: DC
  Administered 2015-12-17: 2 [IU] via SUBCUTANEOUS

## 2015-12-17 MED ORDER — INSULIN ASPART 100 UNIT/ML ~~LOC~~ SOLN
0.0000 [IU] | SUBCUTANEOUS | Status: DC
  Administered 2015-12-17: 4 [IU] via SUBCUTANEOUS
  Administered 2015-12-17 – 2015-12-18 (×2): 2 [IU] via SUBCUTANEOUS

## 2015-12-17 MED ORDER — ASPIRIN EC 81 MG PO TBEC
81.0000 mg | DELAYED_RELEASE_TABLET | Freq: Every day | ORAL | Status: DC
  Administered 2015-12-17 – 2015-12-22 (×6): 81 mg via ORAL
  Filled 2015-12-17 (×6): qty 1

## 2015-12-17 MED ORDER — TRAZODONE HCL 100 MG PO TABS: 100.0000 mg | ORAL_TABLET | Freq: Every day | ORAL | Status: DC

## 2015-12-17 MED ORDER — PANTOPRAZOLE SODIUM 40 MG PO TBEC
40.0000 mg | DELAYED_RELEASE_TABLET | Freq: Every day | ORAL | Status: DC
  Administered 2015-12-17 – 2015-12-22 (×6): 40 mg via ORAL
  Filled 2015-12-17 (×4): qty 1
  Filled 2015-12-17: qty 2

## 2015-12-17 MED ORDER — FUROSEMIDE 10 MG/ML IJ SOLN
20.0000 mg | Freq: Two times a day (BID) | INTRAMUSCULAR | Status: DC
  Administered 2015-12-17 – 2015-12-18 (×3): 20 mg via INTRAVENOUS
  Filled 2015-12-17 (×3): qty 2

## 2015-12-17 NOTE — Progress Notes (Signed)
1 Day Post-Op Procedure(s) (LRB): AORTIC VALVE REPLACEMENT (AVR) (N/A) TRANSESOPHAGEAL ECHOCARDIOGRAM (TEE) (N/A) Subjective:  Doing well after tissue AVR NSR  Objective: Vital signs in last 24 hours: Temp:  [95.7 F (35.4 C)-99.3 F (37.4 C)] 99 F (37.2 C) (12/31 0900) Pulse Rate:  [78-89] 82 (12/31 0900) Cardiac Rhythm:  [-] Normal sinus rhythm (12/31 0800) Resp:  [0-27] 13 (12/31 0900) BP: (74-126)/(47-73) 126/58 mmHg (12/31 0900) SpO2:  [92 %-100 %] 96 % (12/31 0900) Arterial Line BP: (77-161)/(43-82) 159/60 mmHg (12/31 0900) FiO2 (%):  [40 %-50 %] 40 % (12/30 1802) Weight:  [185 lb 6.5 oz (84.1 kg)] 185 lb 6.5 oz (84.1 kg) (12/31 0600)  Hemodynamic parameters for last 24 hours: PAP: (23-67)/(10-46) 45/19 mmHg CO:  [3 L/min-5.6 L/min] 5.6 L/min CI:  [1.6 L/min/m2-3 L/min/m2] 3 L/min/m2  Intake/Output from previous day: 12/30 0701 - 12/31 0700 In: 5917.9 [P.O.:450; I.V.:3017.9; Blood:650; IV Piggyback:1800] Out: [id number] [Urine:2680; Blood:1000; Chest Tube:360] Intake/Output this shift: Total I/O In: 319.2 [P.O.:240; I.V.:79.2] Out: 30 [Urine:30]  Neuro intact Soft systolic flow murmur  Lab Results:  Recent Labs  12/16/15 1829 12/17/15 0411  WBC 11.6* 9.1  HGB 11.5* 10.3*  HCT 32.4* 30.5*  PLT 155 144*   BMET:  Recent Labs  12/16/15 1825 12/16/15 1829 12/17/15 0411  NA 140  --  139  K 4.5  --  4.3  CL 107  --  110  CO2  --   --  21*  GLUCOSE 144*  --  114*  BUN 11  --  10  CREATININE 0.70 0.85 0.80  CALCIUM  --   --  8.8*    PT/INR:  Recent Labs  12/16/15 1210  LABPROT 18.4*  INR 1.53*   ABG    Component Value Date/Time   PHART 7.344* 12/16/2015 1952   HCO3 19.1* 12/16/2015 1952   TCO2 20 12/16/2015 1952   ACIDBASEDEF 6.0* 12/16/2015 1952   O2SAT 97.0 12/16/2015 1952   CBG (last 3)   Recent Labs  12/17/15 0253 12/17/15 0401 12/17/15 0821  GLUCAP 96 106* 111*    Assessment/Plan: S/P Procedure(s) (LRB): AORTIC VALVE  REPLACEMENT (AVR) (N/A) TRANSESOPHAGEAL ECHOCARDIOGRAM (TEE) (N/A) Mobilize Diuresis Diabetes control d/c tubes/lines See progression orders coumadin planned   LOS: 1 day    Tharon Aquas Trigt III 12/17/2015

## 2015-12-17 NOTE — Progress Notes (Signed)
CT surgery p.m. Rounds  Patient examined and record reviewed.Hemodynamics stable,labs satisfactory.Patient had stable day.Continue current care. Maureen Keller 12/17/2015

## 2015-12-18 ENCOUNTER — Inpatient Hospital Stay (HOSPITAL_COMMUNITY): Payer: Medicare Other

## 2015-12-18 LAB — CBC
HCT: 29.7 % — ABNORMAL LOW (ref 36.0–46.0)
Hemoglobin: 10.1 g/dL — ABNORMAL LOW (ref 12.0–15.0)
MCH: 32.1 pg (ref 26.0–34.0)
MCHC: 34 g/dL (ref 30.0–36.0)
MCV: 94.3 fL (ref 78.0–100.0)
Platelets: 121 10*3/uL — ABNORMAL LOW (ref 150–400)
RBC: 3.15 MIL/uL — ABNORMAL LOW (ref 3.87–5.11)
RDW: 13.4 % (ref 11.5–15.5)
WBC: 9.5 10*3/uL (ref 4.0–10.5)

## 2015-12-18 LAB — BASIC METABOLIC PANEL
Anion gap: 6 (ref 5–15)
BUN: 12 mg/dL (ref 6–20)
CO2: 25 mmol/L (ref 22–32)
Calcium: 8.7 mg/dL — ABNORMAL LOW (ref 8.9–10.3)
Chloride: 103 mmol/L (ref 101–111)
Creatinine, Ser: 0.92 mg/dL (ref 0.44–1.00)
GFR calc Af Amer: >60 mL/min (ref >60)
GFR calc non Af Amer: >60 mL/min (ref >60)
Glucose, Bld: 132 mg/dL — ABNORMAL HIGH (ref 65–99)
Potassium: 4.3 mmol/L (ref 3.5–5.1)
Sodium: 134 mmol/L — ABNORMAL LOW (ref 135–145)

## 2015-12-18 LAB — GLUCOSE, CAPILLARY
GLUCOSE-CAPILLARY: 122 mg/dL — AB (ref 65–99)
GLUCOSE-CAPILLARY: 97 mg/dL (ref 65–99)
Glucose-Capillary: 103 mg/dL — ABNORMAL HIGH (ref 65–99)
Glucose-Capillary: 108 mg/dL — ABNORMAL HIGH (ref 65–99)
Glucose-Capillary: 123 mg/dL — ABNORMAL HIGH (ref 65–99)
Glucose-Capillary: 171 mg/dL — ABNORMAL HIGH (ref 65–99)

## 2015-12-18 LAB — PROTIME-INR
INR: 1.22 (ref 0.00–1.49)
PROTHROMBIN TIME: 15.5 s — AB (ref 11.6–15.2)

## 2015-12-18 MED ORDER — DM-GUAIFENESIN ER 30-600 MG PO TB12: 1.0000 | ORAL_TABLET | Freq: Two times a day (BID) | ORAL | Status: DC | PRN

## 2015-12-18 MED ORDER — SODIUM CHLORIDE 0.9 % IV SOLN: 250.0000 mL | INTRAVENOUS | Status: DC | PRN

## 2015-12-18 MED ORDER — WARFARIN SODIUM 3 MG PO TABS
4.0000 mg | ORAL_TABLET | Freq: Every day | ORAL | Status: DC
  Administered 2015-12-18: 4 mg via ORAL
  Filled 2015-12-18: qty 0.5

## 2015-12-18 MED ORDER — LOSARTAN POTASSIUM 50 MG PO TABS
50.0000 mg | ORAL_TABLET | Freq: Every day | ORAL | Status: DC
  Administered 2015-12-18 – 2015-12-22 (×5): 50 mg via ORAL
  Filled 2015-12-18 (×5): qty 1

## 2015-12-18 MED ORDER — MAGNESIUM HYDROXIDE 400 MG/5ML PO SUSP: 30.0000 mL | Freq: Every day | ORAL | Status: DC | PRN

## 2015-12-18 MED ORDER — METOPROLOL SUCCINATE ER 25 MG PO TB24
25.0000 mg | ORAL_TABLET | Freq: Every day | ORAL | Status: DC
Start: 2015-12-18 — End: 2015-12-22
  Administered 2015-12-18 – 2015-12-22 (×5): 25 mg via ORAL
  Filled 2015-12-18 (×5): qty 1

## 2015-12-18 MED ORDER — INSULIN ASPART 100 UNIT/ML ~~LOC~~ SOLN
0.0000 [IU] | Freq: Three times a day (TID) | SUBCUTANEOUS | Status: DC
  Administered 2015-12-18: 3 [IU] via SUBCUTANEOUS
  Administered 2015-12-19: 5 [IU] via SUBCUTANEOUS
  Administered 2015-12-20: 2 [IU] via SUBCUTANEOUS

## 2015-12-18 MED ORDER — FUROSEMIDE 20 MG PO TABS
20.0000 mg | ORAL_TABLET | Freq: Every day | ORAL | Status: AC
  Administered 2015-12-19: 20 mg via ORAL
  Filled 2015-12-18: qty 1

## 2015-12-18 MED ORDER — MOVING RIGHT ALONG BOOK
Freq: Once | Status: AC
  Administered 2015-12-18: 17:00:00
  Filled 2015-12-18: qty 1

## 2015-12-18 MED ORDER — SODIUM CHLORIDE 0.9 % IJ SOLN: 3.0000 mL | INTRAMUSCULAR | Status: DC | PRN

## 2015-12-18 MED ORDER — ALUM & MAG HYDROXIDE-SIMETH 200-200-20 MG/5ML PO SUSP
15.0000 mL | ORAL | Status: DC | PRN
Start: 2015-12-18 — End: 2015-12-22

## 2015-12-18 MED ORDER — INFLUENZA VAC SPLIT QUAD 0.5 ML IM SUSY
0.5000 mL | PREFILLED_SYRINGE | INTRAMUSCULAR | Status: AC
  Administered 2015-12-22: 0.5 mL via INTRAMUSCULAR
  Filled 2015-12-18 (×2): qty 0.5

## 2015-12-18 MED ORDER — GUAIFENESIN-CODEINE 100-10 MG/5ML PO SOLN: 5.0000 mL | ORAL | Status: DC | PRN

## 2015-12-18 MED ORDER — INSULIN ASPART 100 UNIT/ML ~~LOC~~ SOLN
0.0000 [IU] | Freq: Every day | SUBCUTANEOUS | Status: DC
  Administered 2015-12-19: 2 [IU] via SUBCUTANEOUS

## 2015-12-18 MED ORDER — SODIUM CHLORIDE 0.9 % IJ SOLN
3.0000 mL | Freq: Two times a day (BID) | INTRAMUSCULAR | Status: DC
  Administered 2015-12-18: 3 mL via INTRAVENOUS

## 2015-12-18 NOTE — Progress Notes (Signed)
2 Days Post-Op Procedure(s) (LRB): AORTIC VALVE REPLACEMENT (AVR) (N/A) TRANSESOPHAGEAL ECHOCARDIOGRAM (TEE) (N/A) Subjective: Feels much better today Progressing well after aVR for aortic stenosis Walked in the hallway, sinus rhythm, chest x-ray clear We'll transfer to step down Patient is being loaded with Coumadin  Objective: Vital signs in last 24 hours: Temp:  [98 F (36.7 C)-99.1 F (37.3 C)] 98.2 F (36.8 C) (01/01 0751) Pulse Rate:  [62-99] 73 (01/01 0900) Cardiac Rhythm:  [-] Normal sinus rhythm (01/01 0800) Resp:  [8-26] 17 (01/01 0900) BP: (95-162)/(51-91) 137/61 mmHg (01/01 0900) SpO2:  [92 %-100 %] 100 % (01/01 0900) Arterial Line BP: (163-175)/(61-65) 175/65 mmHg (12/31 1030) Weight:  [174 lb 2.6 oz (79 kg)] 174 lb 2.6 oz (79 kg) (01/01 0600)  Hemodynamic parameters for last 24 hours: PAP: (42-54)/(18-22) 42/18 mmHg  Intake/Output from previous day: 12/31 0701 - 01/01 0700 In: 1379.2 [P.O.:960; I.V.:319.2; IV Piggyback:100] Out: 1990 [id number]; Chest Tube:60] Intake/Output this shift: Total I/O In: 190 [P.O.:120; I.V.:70] Out: 740 [Urine:740]  Alert and comfortable Lungs clear Soft systolic flow murmur through aVR Neuro intact  Lab Results:  Recent Labs  12/17/15 1640 12/17/15 1643 12/18/15 0349  WBC 11.5*  --  9.5  HGB 10.8* 10.5* 10.1*  HCT 31.0* 31.0* 29.7*  PLT 141*  --  121*   BMET:  Recent Labs  12/17/15 0411  12/17/15 1643 12/18/15 0349  NA 139  --  135 134*  K 4.3  --  4.6 4.3  CL 110  --  101 103  CO2 21*  --   --  25  GLUCOSE 114*  --  140* 132*  BUN 10  --  11 12  CREATININE 0.80  < > 0.90 0.92  CALCIUM 8.8*  --   --  8.7*  < > = values in this interval not displayed.  PT/INR:  Recent Labs  12/18/15 0349  LABPROT 15.5*  INR 1.22   ABG    Component Value Date/Time   PHART 7.344* 12/16/2015 1952   HCO3 19.1* 12/16/2015 1952   TCO2 22 12/17/2015 1643   ACIDBASEDEF 6.0* 12/16/2015 1952   O2SAT 97.0 12/16/2015  1952   CBG (last 3)   Recent Labs  12/17/15 2335 12/18/15 0351 12/18/15 0749  GLUCAP 123* 122* 97    Assessment/Plan: S/P Procedure(s) (LRB): AORTIC VALVE REPLACEMENT (AVR) (N/A) TRANSESOPHAGEAL ECHOCARDIOGRAM (TEE) (N/A) Mobilize Diuresis d/c tubes/lines Plan for transfer to step-down: see transfer orders Continue Coumadin for intuitive aVR   LOS: 2 days    Tharon Aquas Trigt III 12/18/2015

## 2015-12-18 NOTE — Discharge Instructions (Addendum)
Information on my medicine - Coumadin®   (Warfarin) ° °This medication education was reviewed with me or my healthcare representative as part of my discharge preparation. ° °Why was Coumadin prescribed for you? °Coumadin was prescribed for you because you have a blood clot or a medical condition that can cause an increased risk of forming blood clots. Blood clots can cause serious health problems by blocking the flow of blood to the heart, lung, or brain. Coumadin can prevent harmful blood clots from forming. °As a reminder your indication for Coumadin is:   Blood Clot Prevention After Heart Valve Surgery ° °What test will check on my response to Coumadin? °While on Coumadin (warfarin) you will need to have an INR test regularly to ensure that your dose is keeping you in the desired range. The INR (international normalized ratio) number is calculated from the result of the laboratory test called prothrombin time (PT). ° °If an INR APPOINTMENT HAS NOT ALREADY BEEN MADE FOR YOU please schedule an appointment to have this lab work done by your health care provider within 7 days. °Your INR goal is usually a number between:  2 to 3 or your provider may give you a more narrow range like 2-2.5.  Ask your health care provider during an office visit what your goal INR is. ° °What  do you need to  know  About  COUMADIN? °Take Coumadin (warfarin) exactly as prescribed by your healthcare provider about the same time each day.  DO NOT stop taking without talking to the doctor who prescribed the medication.  Stopping without other blood clot prevention medication to take the place of Coumadin may increase your risk of developing a new clot or stroke.  Get refills before you run out. ° °What do you do if you miss a dose? °If you miss a dose, take it as soon as you remember on the same day then continue your regularly scheduled regimen the next day.  Do not take two doses of Coumadin at the same time. ° °Important Safety  Information °A possible side effect of Coumadin (Warfarin) is an increased risk of bleeding. You should call your healthcare provider right away if you experience any of the following: °? Bleeding from an injury or your nose that does not stop. °? Unusual colored urine (red or dark brown) or unusual colored stools (red or black). °? Unusual bruising for unknown reasons. °? A serious fall or if you hit your head (even if there is no bleeding). ° °Some foods or medicines interact with Coumadin® (warfarin) and might alter your response to warfarin. To help avoid this: °? Eat a balanced diet, maintaining a consistent amount of Vitamin K. °? Notify your provider about major diet changes you plan to make. °? Avoid alcohol or limit your intake to 1 drink for women and 2 drinks for men per day. °(1 drink is 5 oz. wine, 12 oz. beer, or 1.5 oz. liquor.) ° °Make sure that ANY health care provider who prescribes medication for you knows that you are taking Coumadin (warfarin).  Also make sure the healthcare provider who is monitoring your Coumadin knows when you have started a new medication including herbals and non-prescription products. ° °Coumadin® (Warfarin)  Major Drug Interactions  °Increased Warfarin Effect Decreased Warfarin Effect  °Alcohol (large quantities) °Antibiotics (esp. Septra/Bactrim, Flagyl, Cipro) °Amiodarone (Cordarone) °Aspirin (ASA) °Cimetidine (Tagamet) °Megestrol (Megace) °NSAIDs (ibuprofen, naproxen, etc.) °Piroxicam (Feldene) °Propafenone (Rythmol SR) °Propranolol (Inderal) °Isoniazid (INH) °Posaconazole (Noxafil) Barbiturates (Phenobarbital) °Carbamazepine (  Phenobarbital) Carbamazepine (Tegretol) Chlordiazepoxide (Librium) Cholestyramine (Questran) Griseofulvin Oral Contraceptives Rifampin Sucralfate (Carafate) Vitamin K   Coumadin (Warfarin) Major Herbal Interactions  Increased Warfarin Effect Decreased Warfarin Effect  Garlic Ginseng Ginkgo biloba Coenzyme Q10 Green tea St. Johns wort    Coumadin (Warfarin)  FOOD Interactions  Eat a consistent number of servings per week of foods HIGH in Vitamin K (1 serving =  cup)  Collards (cooked, or boiled & drained) Kale (cooked, or boiled & drained) Mustard greens (cooked, or boiled & drained) Parsley *serving size only =  cup Spinach (cooked, or boiled & drained) Swiss chard (cooked, or boiled & drained) Turnip greens (cooked, or boiled & drained)  Eat a consistent number of servings per week of foods MEDIUM-HIGH in Vitamin K (1 serving = 1 cup)  Asparagus (cooked, or boiled & drained) Broccoli (cooked, boiled & drained, or raw & chopped) Brussel sprouts (cooked, or boiled & drained) *serving size only =  cup Lettuce, raw (green leaf, endive, romaine) Spinach, raw Turnip greens, raw & chopped   These websites have more information on Coumadin (warfarin):  FailFactory.se; VeganReport.com.au;   Aortic Valve Replacement, Care After Refer to this sheet in the next few weeks. These instructions provide you with information on caring for yourself after your procedure. Your health care provider may also give you specific instructions. Your treatment has been planned according to current medical practices, but problems sometimes occur. Call your health care provider if you have any problems or questions after your procedure. HOME CARE INSTRUCTIONS   Take medicines only as directed by your health care provider.  If your health care provider has prescribed elastic stockings, wear them as directed.  Take frequent naps or rest often throughout the day.  Avoid lifting over 10 lbs (4.5 kg) or pushing or pulling things with your arms for 6-8 weeks or as directed by your health care provider.  Avoid driving or airplane travel for 4-6 weeks after surgery or as directed by your health care provider. If you are riding in a car for an extended period, stop every 1-2 hours to stretch your legs. Keep a record of your medicines and medical  history with you when traveling.  Do not drive or operate heavy machinery while taking pain medicine. (narcotics).  Do not cross your legs.  Do not use any tobacco products including cigarettes, chewing tobacco, or electronic cigarettes. If you need help quitting, ask your health care provider.  Do not take baths, swim, or use a hot tub until your health care provider approves. Take showers once your health care provider approves. Pat incisions dry. Do not rub incisions with a washcloth or towel.  Avoid climbing stairs and using the handrail to pull yourself up for the first 2-3 weeks after surgery.  Return to work as directed by your health care provider.  Drink enough fluid to keep your urine clear or pale yellow.  Do not strain to have a bowel movement. Eat high-fiber foods if you become constipated. You may also take a medicine to help you have a bowel movement (laxative) as directed by your health care provider.  Resume sexual activity as directed by your health care provider. Men should not use medicines for erectile dysfunction until their doctor says it isokay.  If you had a certain type of heart condition in the past, you may need to take antibiotic medicine before having dental work or surgery. Let your dentist and health care providers know if you had one or  more of the following:  Previous endocarditis.  An artificial (prosthetic) heart valve.  Congenital heart disease. SEEK MEDICAL CARE IF:  You develop a skin rash.   You experience sudden changes in your weight.  You have a fever. SEEK IMMEDIATE MEDICAL CARE IF:   You develop chest pain that is not coming from your incision.  You have drainage (pus), redness, swelling, or pain at your incision site.   You develop shortness of breath or have difficulty breathing.   You have increased bleeding from your incision site.   You develop light-headedness.  MAKE SURE YOU:   Understand these  directions.  Will watch your condition.  Will get help right away if you are not doing well or get worse.   This information is not intended to replace advice given to you by your health care provider. Make sure you discuss any questions you have with your health care provider.   Document Released: 06/21/2005 Document Revised: 12/24/2014 Document Reviewed: 09/16/2012 Elsevier Interactive Patient Education 2016 [place] on my medicine - Coumadin   (Warfarin)  This medication education was reviewed with me or my healthcare representative as part of my discharge preparation. \  Why was Coumadin prescribed for you? Coumadin was prescribed for you because you have a blood clot or a medical condition that can cause an increased risk of forming blood clots. Blood clots can cause serious health problems by blocking the flow of blood to the heart, lung, or brain. Coumadin can prevent harmful blood clots from forming. As a reminder your indication for Coumadin is:   Blood Clot Prevention After Heart Valve Surgery  What test will check on my response to Coumadin? While on Coumadin (warfarin) you will need to have an INR test regularly to ensure that your dose is keeping you in the desired range. The INR (international normalized ratio) number is calculated from the result of the laboratory test called prothrombin time (PT).  If an INR APPOINTMENT HAS NOT ALREADY BEEN MADE FOR YOU please schedule an appointment to have this lab work done by your health care provider within 7 days. Your INR goal is usually a number between:  2 to 3 or your provider may give you a more narrow range like 2-2.5.  Ask your health care provider during an office visit what your goal INR is.  What  do you need to  know  About  COUMADIN? Take Coumadin (warfarin) exactly as prescribed by your healthcare provider about the same time each day.  DO NOT stop taking without talking to the doctor who prescribed the  medication.  Stopping without other blood clot prevention medication to take the place of Coumadin may increase your risk of developing a new clot or stroke.  Get refills before you run out.  What do you do if you miss a dose? If you miss a dose, take it as soon as you remember on the same day then continue your regularly scheduled regimen the next day.  Do not take two doses of Coumadin at the same time.  Important Safety Information A possible side effect of Coumadin (Warfarin) is an increased risk of bleeding. You should call your healthcare provider right away if you experience any of the following: ? Bleeding from an injury or your nose that does not stop. ? Unusual colored urine (red or dark brown) or unusual colored stools (red or black). ? Unusual bruising for unknown reasons. ? A serious fall or if you  hit your head (even if there is no bleeding).  Some foods or medicines interact with Coumadin (warfarin) and might alter your response to warfarin. To help avoid this: ? Eat a balanced diet, maintaining a consistent amount of Vitamin K. ? Notify your provider about major diet changes you plan to make. ? Avoid alcohol or limit your intake to 1 drink for women and 2 drinks for men per day. (1 drink is 5 oz. wine, 12 oz. beer, or 1.5 oz. liquor.)  Make sure that ANY health care provider who prescribes medication for you knows that you are taking Coumadin (warfarin).  Also make sure the healthcare provider who is monitoring your Coumadin knows when you have started a new medication including herbals and non-prescription products.  Coumadin (Warfarin)  Major Drug Interactions  Increased Warfarin Effect Decreased Warfarin Effect  Alcohol (large quantities) Antibiotics (esp. Septra/Bactrim, Flagyl, Cipro) Amiodarone (Cordarone) Aspirin (ASA) Cimetidine (Tagamet) Megestrol (Megace) NSAIDs (ibuprofen, naproxen, etc.) Piroxicam (Feldene) Propafenone (Rythmol SR) Propranolol  (Inderal) Isoniazid (INH) Posaconazole (Noxafil) Barbiturates (Phenobarbital) Carbamazepine (Tegretol) Chlordiazepoxide (Librium) Cholestyramine (Questran) Griseofulvin Oral Contraceptives Rifampin Sucralfate (Carafate) Vitamin K   Coumadin (Warfarin) Major Herbal Interactions  Increased Warfarin Effect Decreased Warfarin Effect  Garlic Ginseng Ginkgo biloba Coenzyme Q10 Green tea St. Johns wort    Coumadin (Warfarin) FOOD Interactions  Eat a consistent number of servings per week of foods HIGH in Vitamin K (1 serving =  cup)  Collards (cooked, or boiled & drained) Kale (cooked, or boiled & drained) Mustard greens (cooked, or boiled & drained) Parsley *serving size only =  cup Spinach (cooked, or boiled & drained) Swiss chard (cooked, or boiled & drained) Turnip greens (cooked, or boiled & drained)  Eat a consistent number of servings per week of foods MEDIUM-HIGH in Vitamin K (1 serving = 1 cup)  Asparagus (cooked, or boiled & drained) Broccoli (cooked, boiled & drained, or raw & chopped) Brussel sprouts (cooked, or boiled & drained) *serving size only =  cup Lettuce, raw (green leaf, endive, romaine) Spinach, raw Turnip greens, raw & chopped   These websites have more information on Coumadin (warfarin):  FailFactory.se; VeganReport.com.au;

## 2015-12-18 NOTE — Progress Notes (Signed)
CT Surgery PM Rounds Patient examined and record reviewed.Hemodynamics stable,labs satisfactory.Patient had stable day.Continue current care. Maureen Keller 12/18/2015

## 2015-12-18 NOTE — Plan of Care (Signed)
Problem: Pain Managment: Goal: General experience of comfort will improve Outcome: Progressing Pt pain level tolerating with scheduled tylenol and PRN Ultram, demonstrates appropriate use of splinting pillow and sternal precautions.  Problem: Activity: Goal: Risk for activity intolerance will decrease Outcome: Progressing Pt tolerating early ambulation and activity progression with verbal encouragement.   Problem: Phase II - Intermediate Post-Op Goal: CBGs/Blood Glucose per SCIP Criteria Outcome: Completed/Met Date Met:  12/18/15 Q4hr CBG's within excepted range with increase/advancement of diet. Goal: Advance Diet Outcome: Completed/Met Date Met:  12/18/15 Advanced to heart healthy diet with moderate intake noted

## 2015-12-19 ENCOUNTER — Inpatient Hospital Stay (HOSPITAL_COMMUNITY): Payer: Medicare Other

## 2015-12-19 LAB — GLUCOSE, CAPILLARY
GLUCOSE-CAPILLARY: 64 mg/dL — AB (ref 65–99)
GLUCOSE-CAPILLARY: 107 mg/dL — AB (ref 65–99)
GLUCOSE-CAPILLARY: 220 mg/dL — AB (ref 65–99)
Glucose-Capillary: 202 mg/dL — ABNORMAL HIGH (ref 65–99)
Glucose-Capillary: 63 mg/dL — ABNORMAL LOW (ref 65–99)

## 2015-12-19 LAB — BASIC METABOLIC PANEL
Anion gap: 9 (ref 5–15)
BUN: 11 mg/dL (ref 6–20)
CO2: 25 mmol/L (ref 22–32)
Calcium: 8.6 mg/dL — ABNORMAL LOW (ref 8.9–10.3)
Chloride: 106 mmol/L (ref 101–111)
Creatinine, Ser: 0.77 mg/dL (ref 0.44–1.00)
GFR calc Af Amer: >60 mL/min (ref >60)
GFR calc non Af Amer: >60 mL/min (ref >60)
Glucose, Bld: 107 mg/dL — ABNORMAL HIGH (ref 65–99)
Potassium: 3.7 mmol/L (ref 3.5–5.1)
Sodium: 140 mmol/L (ref 135–145)

## 2015-12-19 LAB — CBC
HCT: 28.6 % — ABNORMAL LOW (ref 36.0–46.0)
Hemoglobin: 9.7 g/dL — ABNORMAL LOW (ref 12.0–15.0)
MCH: 31.5 pg (ref 26.0–34.0)
MCHC: 33.9 g/dL (ref 30.0–36.0)
MCV: 92.9 fL (ref 78.0–100.0)
Platelets: 148 10*3/uL — ABNORMAL LOW (ref 150–400)
RBC: 3.08 MIL/uL — ABNORMAL LOW (ref 3.87–5.11)
RDW: 13 % (ref 11.5–15.5)
WBC: 7.5 10*3/uL (ref 4.0–10.5)

## 2015-12-19 LAB — PROTIME-INR
INR: 1.14 (ref 0.00–1.49)
Prothrombin Time: 14.8 seconds (ref 11.6–15.2)

## 2015-12-19 MED ORDER — POTASSIUM CHLORIDE CRYS ER 20 MEQ PO TBCR
20.0000 meq | EXTENDED_RELEASE_TABLET | Freq: Every day | ORAL | Status: AC
  Administered 2015-12-19 – 2015-12-20 (×2): 20 meq via ORAL
  Filled 2015-12-19 (×2): qty 1

## 2015-12-19 MED ORDER — WARFARIN SODIUM 5 MG PO TABS
5.0000 mg | ORAL_TABLET | Freq: Every day | ORAL | Status: DC
  Administered 2015-12-19 – 2015-12-20 (×2): 5 mg via ORAL
  Filled 2015-12-19 (×2): qty 1

## 2015-12-19 NOTE — Care Management Important Message (Signed)
Important Message  Patient Details  Name: RASHYA MCDOUGAL MRN: [id number] Date of Birth: 1946/01/31   Medicare Important Message Given:  Yes    Louanne Belton 12/19/2015, 11:12 AMImportant Message  Patient Details  Name: PUALANI BOGGESS MRN: [id number] Date of Birth: 06-Nov-1946   Medicare Important Message Given:  Yes    Liem Copenhaver G 12/19/2015, 11:12 AM

## 2015-12-19 NOTE — Progress Notes (Signed)
Pt admitted to 2W pt stable and ambulates fine. Pt introduced to room and equipment. Pt instructed to use call bell or nurse phone number if needed.

## 2015-12-19 NOTE — Progress Notes (Addendum)
3 Days Post-Op Procedure(s) (LRB): AORTIC VALVE REPLACEMENT (AVR) (N/A) TRANSESOPHAGEAL ECHOCARDIOGRAM (TEE) (N/A) Subjective: Patient continues to progress ambulating on room air Maintaining sinus rhythm Chest x-ray clear She is below her preop weight Waiting for step down-telemetry bed Coumadin load in progress 2.5 mg--4 mg-5 mg this p.m.  Objective: Vital signs in last 24 hours: Temp:  [98 F (36.7 C)-98.7 F (37.1 C)] 98 F (36.7 C) (01/02 0900) Pulse Rate:  [64-105] 77 (01/02 0400) Cardiac Rhythm:  [-] Normal sinus rhythm (01/01 2000) Resp:  [15-29] 20 (01/02 0400) BP: (80-150)/(46-74) 111/74 mmHg (01/02 0400) SpO2:  [92 %-100 %] 99 % (01/02 0400)  Hemodynamic parameters for last 24 hours:    Intake/Output from previous day: 01/01 0701 - 01/02 0700 In: 1030 [P.O.:960; I.V.:70] Out: 2571 [Urine:2571] Intake/Output this shift:    Alert and comfortable Lungs clear Soft  systolic flow murmur through systolic valve No edema  Lab Results:  Recent Labs  12/18/15 0349 12/19/15 0256  WBC 9.5 7.5  HGB 10.1* 9.7*  HCT 29.7* 28.6*  PLT 121* 148*   BMET:  Recent Labs  12/18/15 0349 12/19/15 0256  NA 134* 140  K 4.3 3.7  CL 103 106  CO2 25 25  GLUCOSE 132* 107*  BUN 12 11  CREATININE 0.92 0.77  CALCIUM 8.7* 8.6*    PT/INR:  Recent Labs  12/19/15 0256  LABPROT 14.8  INR 1.14   ABG    Component Value Date/Time   PHART 7.344* 12/16/2015 1952   HCO3 19.1* 12/16/2015 1952   TCO2 22 12/17/2015 1643   ACIDBASEDEF 6.0* 12/16/2015 1952   O2SAT 97.0 12/16/2015 1952   CBG (last 3)   Recent Labs  12/18/15 1600 12/18/15 2143 12/19/15 0914  GLUCAP 171* 103* 202*    Assessment/Plan: S/P Procedure(s) (LRB): AORTIC VALVE REPLACEMENT (AVR) (N/A) TRANSESOPHAGEAL ECHOCARDIOGRAM (TEE) (N/A) Plan for transfer to step-down: see transfer orders Continue Coumadin load 5 mg by mouth tonight -INR 1.1 Remove EPW's tomorrow   LOS: 3 days    Tharon Aquas  Trigt III 12/19/2015

## 2015-12-19 NOTE — Progress Notes (Signed)
Pt transferred to 2W.  Pt ambulated to new room.  Report called to 2W.  All VS WNL upon arrival.

## 2015-12-20 ENCOUNTER — Encounter (HOSPITAL_COMMUNITY): Payer: Self-pay | Admitting: Thoracic Surgery (Cardiothoracic Vascular Surgery)

## 2015-12-20 LAB — GLUCOSE, CAPILLARY
GLUCOSE-CAPILLARY: 115 mg/dL — AB (ref 65–99)
Glucose-Capillary: 95 mg/dL (ref 65–99)
Glucose-Capillary: 127 mg/dL — ABNORMAL HIGH (ref 65–99)
Glucose-Capillary: 126 mg/dL — ABNORMAL HIGH (ref 65–99)

## 2015-12-20 LAB — PROTIME-INR
INR: 1.16 (ref 0.00–1.49)
Prothrombin Time: 15 seconds (ref 11.6–15.2)

## 2015-12-20 MED ORDER — COUMADIN BOOK
Freq: Once | Status: AC
  Administered 2015-12-20: 11:00:00
  Filled 2015-12-20: qty 1

## 2015-12-20 MED FILL — Heparin Sodium (Porcine) Inj 1000 Unit/ML: INTRAMUSCULAR | Qty: 30 | Status: AC

## 2015-12-20 MED FILL — Potassium Chloride Inj 2 mEq/ML: INTRAVENOUS | Qty: 40 | Status: AC

## 2015-12-20 MED FILL — Magnesium Sulfate Inj 50%: INTRAMUSCULAR | Qty: 10 | Status: AC

## 2015-12-20 NOTE — Progress Notes (Signed)
CARDIAC REHAB PHASE I   PRE:  Rate/Rhythm: 71 SR  BP:  Sitting: 117/61        SaO2: 95 RA  MODE:  Ambulation: 640 ft   POST:  Rate/Rhythm: 109 ST  BP:  Sitting: 147/703         SaO2: 99 RA  Pt sitting up in chair, states she feels well this morning and has already walked once. Pt ambulated 640 ft on RA, hand held assist, brisk, steady gait, tolerated well. Pt c/o mild DOE (states it is improved since before surgery), denies pain, dizziness, declined rest stop. Sats 99% on RA. Encouraged IS. Pt states she hopes to discharge home tomorrow. Pt states she lives alone but has friends that will be able to provide 24 hr care upon discharge. Pt to recliner after walk, feet elevated, call bell within reach. Will follow.   [id number] Lenna Sciara, RN, BSN 12/20/2015 9:31 AM

## 2015-12-20 NOTE — Progress Notes (Signed)
Removed epicardial wires per order. 4 intact.  Pt stated she was in pain and had numbness to right ear and neck.  Called PA Erin Barrett to pull wire remaining way out.  Pt denied any pain prior to and after removal.  Pt instructed to remain on bedrest for one hour.  Frequent vitals will be taken and documented. Pt resting with call bell within reach. Payton Emerald, RN

## 2015-12-20 NOTE — Progress Notes (Signed)
Utilization review completed.  

## 2015-12-20 NOTE — Progress Notes (Signed)
Inpatient Diabetes Program Recommendations  AACE/ADA: New Consensus Statement on Inpatient Glycemic Control (2015)  Target Ranges:  Prepandial:   less than 140 mg/dL      Peak postprandial:   less than 180 mg/dL (1-2 hours)      Critically ill patients:  140 - 180 mg/dL   Review of Glycemic Control  Diabetes history: None Current orders for Inpatient glycemic control: Novolog Moderate + HS  Inpatient Diabetes Program Recommendations: Correction (SSI): Patient had hypoglycemia in the 60's yesterday after 5 units of Novolog. Please consider decreasing correction to Novolog Sensitive scale. HgbA1C: 5.5%  Thanks,  Tama Headings RN, MSN, [facility] Inpatient Diabetes Coordinator Team Pager [phone] (8a-5p)

## 2015-12-20 NOTE — Care Management Note (Signed)
Case Management Note Marvetta Gibbons RN, BSN Unit 2W-Case Manager [phone]  Patient Details  Name: Maureen Keller MRN: [id number] Date of Birth: Oct 24, 1946  Subjective/Objective:   Pt admitted s/p AVR                 Action/Plan: PTA pt lived at home, tx from 2S on 12/18/14, CM to follow for d/c needs  Expected Discharge Date:                 Expected Discharge Plan:  Home/Self Care  In-House Referral:     Discharge planning Services  CM Consult  Post Acute Care Choice:    Choice offered to:     DME Arranged:    DME Agency:     HH Arranged:    [place] Agency:     Status of Service:  Completed, signed off  Medicare Important Message Given:  Yes Date Medicare IM Given:    Medicare IM give by:    Date Additional Medicare IM Given:    Additional Medicare Important Message give by:     If discussed at [place] of Stay Meetings, dates discussed:    Additional Comments:  Dawayne Patricia, RN 12/20/2015, 9:52 AM

## 2015-12-20 NOTE — Progress Notes (Addendum)
      [address]       [place] [address]             [phone]      4 Days Post-Op Procedure(s) (LRB): AORTIC VALVE REPLACEMENT (AVR) (N/A) TRANSESOPHAGEAL ECHOCARDIOGRAM (TEE) (N/A)   Subjective:  Ms. Beare looks great.  She does state she may have overdone it yesterday.  + ambulation + BM  Objective: Vital signs in last 24 hours: Temp:  [97.6 F (36.4 C)-98 F (36.7 C)] 97.6 F (36.4 C) (01/03 0619) Pulse Rate:  [68-80] 72 (01/03 0619) Cardiac Rhythm:  [-] Normal sinus rhythm (01/03 0735) Resp:  [14-25] 18 (01/03 0619) BP: (106-127)/(50-91) 126/65 mmHg (01/03 0619) SpO2:  [95 %-99 %] 95 % (01/03 0619) Weight:  [175 lb 11.3 oz (79.7 kg)] 175 lb 11.3 oz (79.7 kg) (01/03 0619)  Intake/Output from previous day: 01/02 0701 - 01/03 0700 In: 960 [P.O.:960] Out: 1750 [Urine:1750]  General appearance: alert, cooperative and no distress Heart: regular rate and rhythm Lungs: clear to auscultation bilaterally Abdomen: soft, non-tender; bowel sounds normal; no masses,  no organomegaly Extremities: edema none appreciated Wound: clean and dry  Lab Results:  Recent Labs  12/18/15 0349 12/19/15 0256  WBC 9.5 7.5  HGB 10.1* 9.7*  HCT 29.7* 28.6*  PLT 121* 148*   BMET:  Recent Labs  12/18/15 0349 12/19/15 0256  NA 134* 140  K 4.3 3.7  CL 103 106  CO2 25 25  GLUCOSE 132* 107*  BUN 12 11  CREATININE 0.92 0.77  CALCIUM 8.7* 8.6*    PT/INR:  Recent Labs  12/20/15 0252  LABPROT 15.0  INR 1.16   ABG    Component Value Date/Time   PHART 7.344* 12/16/2015 1952   HCO3 19.1* 12/16/2015 1952   TCO2 22 12/17/2015 1643   ACIDBASEDEF 6.0* 12/16/2015 1952   O2SAT 97.0 12/16/2015 1952   CBG (last 3)   Recent Labs  12/19/15 1737 12/19/15 2124 12/20/15 0609  GLUCAP 107* 220* 95    Assessment/Plan: S/P Procedure(s) (LRB): AORTIC VALVE REPLACEMENT (AVR) (N/A) TRANSESOPHAGEAL ECHOCARDIOGRAM (TEE) (N/A)  1. CV- maintaining NSR- continue  Lopressor and Cozaar 2. Pulm- no acute issues, continue IS 3. Renal- weigh is at baseline, no LE edema present, can probably stop Lasix 4. INR 1.16, coumadin dose has been increased, will repeat 5 mg tonight, if no rise will increase to 7.5mg  daily 5. Dispo- patient stable, will d/c EPW, not much response to coumadin, possibly d/c home tomorrow if INR is trending up   LOS: 4 days    BARRETT, ERIN 12/20/2015  Patient seen and examined, agree with above Home once INR starts to bump, can fine tune as an outpatient  Safina Huard C. Roxan Hockey, MD [facility] [phone]

## 2015-12-20 NOTE — Discharge Summary (Signed)
Physician Discharge Summary       [address]       [place] [address]             [phone]    Patient ID: Maureen Keller MRN: [id number] DOB/AGE: 70-Jun-1947 70 y.o.  Admit date: 12/16/2015 Discharge date: 12/22/2015  Admission Diagnosis: Severe aortic stenosis and moderately severe aortic insufficiency  Active Diagnoses:  1. CHF ([place]) 2. Pulmonary hypertension ([place]) 3. CKD (stage III) 4. Hypertension 5. Hyperlipidemia 6. Depression 7. Hypothyroidism 8. Tobacco abuse 9. Mild  MR 10. ABL anemia 11. Mild thrombocytopenia  Procedure (s): Median sternotomy extracorporeal circulation, aortic valve replacement with 19 mm Edwards Intuity, Elite aortic valve (model number [id number], serial number [id number]) by Dr. Roxan Hockey on 12/16/2015.  History of Presenting Illness: Mrs. Maureen Keller is a 70 y.o. woman with a history of hypertension, moderate AR and AS, moderate MR, mild pulmonary hypertension, and grade II diastolic dysfunction. She recently saw Dr. Radford Pax for a scheduled follow-up visit. At that visit she complained of chest tightness and shortness of breath with exertion. This had been present for some time but had worsened just before she saw Dr. Radford Pax. A repeat echocardiogram showed progression from moderate to severe aortic stenosis and from moderate to moderately severe aortic insufficiency. She had only mild mitral regurgitation. Her peak gradient was 78 mmHg and mean gradient was 43 mmHg. She then underwent cardiac catheterization which revealed normal coronary arteries. Her gradients did not measure his high with a 11 mm mean gradient. Her valve area was calculated at 1.02 cm.  She had previously been walking about 3 miles a day. Over the past year, that had cut down to 2 miles a day. She recently has noted the shortness of breath coming on with even less exertion such as walking from the parking lot into [employer]. She has not had any rest or nocturnal symptoms.  She denies swelling in her legs, orthopnea, and paroxysmal nocturnal dyspnea. She denies any syncope or presyncope. Dr. Roxan Hockey discussed with the patient that she has severe aortic stenosis and moderately severe aortic regurgitation with accelerating exertional angina. Aortic valve replacement is indicated in the setting of severe aortic stenosis with symptoms for survival benefit and symptom relief.  He discussed the surgical options with Maureen Keller. He discussed tissue versus mechanical valves. He discussed the relative advantages and disadvantages for both of those approaches. Given her age a tissue valve would be the appropriate selection. She had questions about transcatheter valves, but she does not fit the risk category for that procedure. Pre operative duplex carotid showed no significant internal carotid artery stenosis bilaterally. She was admitted on 12/30 in order to undergo an AVR.  Brief Hospital Course:  The patient was extubated the evening of surgery without difficulty. She remained afebrile and hemodynamically stable. Gordy Councilman, a line, chest tubes, and foley were removed early in the post operative course. Lopressor was started and titrated accordingly. She was volume over loaded and diuresed. She had ABL anemia. She did not require a post op transfusion. Last H and H was stable at 9.7 and 28.6. He was weaned off the insulin drip. The patient's glucose remained well controlled. The patient's HGA1C pre op was 5.5. She was started on Coumadin. INR goal is 2-2.5. Her last INR was up to 1.39. She is on Coumadin 7.5 mg daily.The patient was felt surgically stable for transfer from the ICU to [facility] for further convalescence on 12/19/2015. She continues to progress  with cardiac rehab. She was ambulating on room air. She has been tolerating a diet and has had a bowel movement. Epicardial pacing wires have already been removed. Chest tube sutures will be removed prior to discharge. We will  arrange home health to draw the PT and INR (as is on Coumadin) on Friday 12/23/2015 and Monday 12/26/2015. The patient is felt surgically stable for discharge today.   Latest Vital Signs: Blood pressure 128/58, pulse 75, temperature 98.1 F (36.7 C), temperature source Oral, resp. rate 18, height 5\' 5"  (1.651 m), weight 179 lb 7.3 oz (81.4 kg), SpO2 99 %.  Physical Exam: General appearance: alert, cooperative and no distress Heart: regular rate and rhythm Lungs: clear to auscultation bilaterally Abdomen: soft, non-tender; bowel sounds normal; no masses, no organomegaly Extremities: edema none appreciated Wound: clean and dry  Discharge Condition:Stable and discharged to home  Recent laboratory studies:  Lab Results  Component Value Date   WBC 7.5 12/19/2015   HGB 9.7* 12/19/2015   HCT 28.6* 12/19/2015   MCV 92.9 12/19/2015   PLT 148* 12/19/2015   Lab Results  Component Value Date   NA 140 12/19/2015   K 3.7 12/19/2015   CL 106 12/19/2015   CO2 25 12/19/2015   CREATININE 0.77 12/19/2015   GLUCOSE 107* 12/19/2015   Diagnostic Studies: Dg Chest 2 View  12/19/2015  CLINICAL DATA:  70 year old female status post aortic valve replacement. EXAM: CHEST  2 VIEW COMPARISON:  12/18/2015 and prior exams FINDINGS: Aortic valve replacement changes again noted. There has been interval removal of a right IJ central venous catheter sheath. Bibasilar atelectasis and tiny bilateral pleural effusions again noted. There is no evidence of pneumothorax. IMPRESSION: Right central venous catheter sheath removal, otherwise unchanged chest with mild bibasilar atelectasis and tiny effusions. Electronically Signed   By: Margarette Canada M.D.   On: 12/19/2015 09:21      Discharge Instructions    Amb Referral to Cardiac Rehabilitation    Complete by:  As directed   Diagnosis:  Valve Replacement/Repair          Discharge Medications:   Medication List    STOP taking these medications        amLODipine  2.5 MG tablet  Commonly known as:  NORVASC     Red Yeast Rice 600 MG Caps      TAKE these medications        aspirin 81 MG EC tablet  Take 1 tablet (81 mg total) by mouth daily.     lamoTRIgine 100 MG tablet  Commonly known as:  LAMICTAL  Take 100-200 mg by mouth 2 (two) times daily. 200mg  in the morning and 100mg  in the evening     latanoprost 0.005 % ophthalmic solution  Commonly known as:  XALATAN  Place 1 drop into both eyes at bedtime.     levothyroxine 200 MCG tablet  Commonly known as:  SYNTHROID, LEVOTHROID  Take 200 mcg by mouth daily.     losartan 50 MG tablet  Commonly known as:  COZAAR  Take 1 tablet (50 mg total) by mouth daily.     metoprolol succinate 25 MG 24 hr tablet  Commonly known as:  TOPROL-XL  Take 1 tablet (25 mg total) by mouth daily.     multivitamin tablet  Take 1 tablet by mouth daily.     traMADol 50 MG tablet  Commonly known as:  ULTRAM  Take 1 tablet (50 mg total) by mouth every 6 (six) hours  as needed for moderate pain.     traZODone 100 MG tablet  Commonly known as:  DESYREL  Take 200 mg by mouth at bedtime.     warfarin 7.5 MG tablet  Commonly known as:  COUMADIN  Take 1 tablet (7.5 mg total) by mouth daily at 6 PM. Or as directed.       The patient has been discharged on:   1.Beta Blocker:  Yes [ x  ]                              No   [   ]                              If No, reason:  2.Ace Inhibitor/ARB: Yes [ x  ]                                     No  [    ]                                     If No, reason:  3.Statin:   Yes [   ]                  No  [ x  ]                  If No, reason: No CAD  4.S[place]a [place]:  Yes  [ x  ]                  No   [   ]                  If No, reason:  Follow Up Appointments: Follow-up Information    Follow up with Eileen Stanford, PA-C On 01/12/2016.   Specialties:  Cardiology, Radiology   Why:  Appointment time is at 9:30 am   Contact information:   [address]  [place] [address] [phone]       Follow up with Melrose Nakayama, MD On 01/17/2016.   Specialty:  Cardiothoracic Surgery   Why:  PA/LAT CXR to be taken (at [facility] which is in the same building as Dr. Leonarda Salon office) on 01/17/2016 at 9:45 am;Appointment time is at 10:30 am   Contact information:   [address] [place] [address] [phone]       Follow up with [facility].   Why:  Please draw a  PT and INR (as is on Coumadin, INR goal 2-2.5)  on Friday 12/23/2015 and Monday 12/26/2015. Please call or fax results to Dr. Theodosia Blender office      Signed: Lars Pinks MPA-C 12/22/2015, 8:36 AM

## 2015-12-20 NOTE — Progress Notes (Addendum)
Pt ambulated 637ft on RA.  Pt tolerated well but became winded while getting back to her room.  O2 sat 95% on RA.  Pt given pain medicine for mid sternal surgical pain and is now sitting up in chair in no apparent distress.  RN will cont to monitor pt closely.  Claudette Stapler, RN

## 2015-12-21 LAB — GLUCOSE, CAPILLARY
GLUCOSE-CAPILLARY: 104 mg/dL — AB (ref 65–99)
Glucose-Capillary: 54 mg/dL — ABNORMAL LOW (ref 65–99)
Glucose-Capillary: 146 mg/dL — ABNORMAL HIGH (ref 65–99)
Glucose-Capillary: 124 mg/dL — ABNORMAL HIGH (ref 65–99)

## 2015-12-21 LAB — PROTIME-INR
INR: 1.29 (ref 0.00–1.49)
PROTHROMBIN TIME: 16.2 s — AB (ref 11.6–15.2)

## 2015-12-21 MED ORDER — WARFARIN SODIUM 7.5 MG PO TABS
7.5000 mg | ORAL_TABLET | Freq: Every day | ORAL | Status: DC
  Administered 2015-12-21: 7.5 mg via ORAL
  Filled 2015-12-21: qty 1

## 2015-12-21 NOTE — Progress Notes (Signed)
Pt reported IV in right forearm stinging when flushed at 2001,IV team consult ordered, at 2036 new IV was placed in left hand, pt reports discomfort with new IV in left hand and request for both IVs to be removed, pt educated and aware of need of IV access for emergency use but does not want a new IV access at this time, IVs removed at 2209, no current IV access, RN will continue to monitor, pt call bell within reach, and bed in lowest position.    Izola Price, RN 12/20/2015

## 2015-12-21 NOTE — Progress Notes (Signed)
[id number] Pt had already walked 700 ft so did not walk with pt but completed ex ed. Encouraged IS. Discussed CRP 2 and will refer to [facility]. Put on discharge video for pt to view. Pt has coumadin booklet, gave heart healthy and low sodium diets. Pt is going to increase distance next walk. She stated she felt comfortable walking independently. Stated she felt a little SOB so we discussed pursed lip breathing and taking breaks. Graylon Good RN BSN 12/21/2015 10:56 AM

## 2015-12-21 NOTE — Progress Notes (Addendum)
      [address]       [place] [address]             [phone]      5 Days Post-Op Procedure(s) (LRB): AORTIC VALVE REPLACEMENT (AVR) (N/A) TRANSESOPHAGEAL ECHOCARDIOGRAM (TEE) (N/A)   Subjective:  Patient unhappy with IV team last night.  States just put IV in and did not look for a place that would comfortable for her.  She states she must go home today as she does not feel she will progress here.  + ambulation + BM  Objective: Vital signs in last 24 hours: Temp:  [98.2 F (36.8 C)-99 F (37.2 C)] 99 F (37.2 C) (01/04 0633) Pulse Rate:  [65-81] 81 (01/04 0633) Cardiac Rhythm:  [-] Normal sinus rhythm (01/04 0700) Resp:  [18-19] 18 (01/04 0633) BP: (113-161)/(51-70) 131/58 mmHg (01/04 0633) SpO2:  [98 %-100 %] 98 % (01/04 0633) Weight:  [175 lb 8 oz (79.606 kg)] 175 lb 8 oz (79.606 kg) (01/04 [id number])  Intake/Output from previous day: 01/03 0701 - 01/04 0700 In: 480 [P.O.:480] Out: 1000 [Urine:1000]  General appearance: alert, cooperative and no distress Heart: regular rate and rhythm Lungs: clear to auscultation bilaterally Abdomen: soft, non-tender; bowel sounds normal; no masses,  no organomegaly Extremities: edema trace Wound: clean and dry  Lab Results:  Recent Labs  12/19/15 0256  WBC 7.5  HGB 9.7*  HCT 28.6*  PLT 148*   BMET:  Recent Labs  12/19/15 0256  NA 140  K 3.7  CL 106  CO2 25  GLUCOSE 107*  BUN 11  CREATININE 0.77  CALCIUM 8.6*    PT/INR:  Recent Labs  12/21/15 0419  LABPROT 16.2*  INR 1.29   ABG    Component Value Date/Time   PHART 7.344* 12/16/2015 1952   HCO3 19.1* 12/16/2015 1952   TCO2 22 12/17/2015 1643   ACIDBASEDEF 6.0* 12/16/2015 1952   O2SAT 97.0 12/16/2015 1952   CBG (last 3)   Recent Labs  12/20/15 2110 12/21/15 0602 12/21/15 0608  GLUCAP 126* 54* 104*    Assessment/Plan: S/P Procedure(s) (LRB): AORTIC VALVE REPLACEMENT (AVR) (N/A) TRANSESOPHAGEAL ECHOCARDIOGRAM (TEE)  (N/A)  1. CV- NSR- continue Toprol, Cozaar 2. INR 1.26- minimal response to 5mg  , will increase dose to 7.5 mg daily 3. Renal- weight is below baseline, no indication for Lasix at this time 4. Pulm- no acute issues, continue IS 5. Dispo- patient stable, increase coumadin, patient for discharge once INR is rising  LOS: 5 days    BARRETT, ERIN 12/21/2015  Patient seen and examined, agree with above. Will increase coumadin. 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  Revonda Standard Roxan Hockey, MD [facility] [phone]

## 2015-12-21 NOTE — Clinical Documentation Improvement (Signed)
Cardiothoracic Surgeons, PA's  Can the diagnosis of Grade ii Diastolic Dysfunction be further specified which was noted in H&P? Please document findings in next progress note; NOT in BPA drop down box. Thank you!   Diastolic Dysfunction codes to "Other ill-defined heart diseases" not CHF  Other  Clinically Undetermined  Supporting Information:  AVR performed  Being treated with PO Toprol XL 25 mg Daily  Please exercise your independent, professional judgment when responding. A specific answer is not anticipated or expected.  Thank You, Zoila Shutter RN, BSN, [place] [phone]; Cell: [phone]

## 2015-12-22 DIAGNOSIS — I080 Rheumatic disorders of both mitral and aortic valves: Secondary | ICD-10-CM | POA: Diagnosis not present

## 2015-12-22 LAB — PROTIME-INR
INR: 1.39 (ref 0.00–1.49)
PROTHROMBIN TIME: 17.2 s — AB (ref 11.6–15.2)

## 2015-12-22 MED ORDER — WARFARIN SODIUM 7.5 MG PO TABS: 7.5000 mg | ORAL_TABLET | Freq: Every day | ORAL | Status: DC

## 2015-12-22 MED ORDER — TRAMADOL HCL 50 MG PO TABS: 50.0000 mg | ORAL_TABLET | Freq: Four times a day (QID) | ORAL | Status: DC | PRN

## 2015-12-22 MED ORDER — ASPIRIN 81 MG PO TBEC: 81.0000 mg | DELAYED_RELEASE_TABLET | Freq: Every day | ORAL | Status: DC

## 2015-12-22 MED ORDER — LOSARTAN POTASSIUM 50 MG PO TABS: 50.0000 mg | ORAL_TABLET | Freq: Every day | ORAL | Status: DC

## 2015-12-22 NOTE — Care Management Note (Signed)
Case Management Note Marvetta Gibbons RN, BSN Unit 2W-Case Manager [phone]  Patient Details  Name: Maureen Keller MRN: [id number] Date of Birth: [birth year]  Subjective/Objective:   Pt admitted s/p AVR                 Action/Plan: PTA pt lived at home, tx from 2S on 12/18/14, CM to follow for d/c needs  Expected Discharge Date:                 Expected Discharge Plan:  Home/Self Care  In-House Referral:     Discharge planning Services  CM Consult  Post Acute Care Choice:    Choice offered to:     DME Arranged:    DME Agency:     HH Arranged:  RN [place] Agency:  [facility]  Status of Service:  Completed, signed off  Medicare Important Message Given:  Yes Date Medicare IM Given:    Medicare IM give by:    Date Additional Medicare IM Given:    Additional Medicare Important Message give by:     If discussed at [place] of Stay Meetings, dates discussed:    Additional Comments: CM assessed pt. CM offered pt choice, pt chose [facility], agency contacted and referral accepted.  CM communicated the need for the [facility] RN to draw labs. Maryclare Labrador, RN 12/22/2015, 10:07 AM

## 2015-12-22 NOTE — Progress Notes (Signed)
      [address]       [place] [address]             [phone]        6 Days Post-Op Procedure(s) (LRB): AORTIC VALVE REPLACEMENT (AVR) (N/A) TRANSESOPHAGEAL ECHOCARDIOGRAM (TEE) (N/A)  Subjective: Patient without complaints and really wants to go home  Objective: Vital signs in last 24 hours: Temp:  [98.1 F (36.7 C)-99.3 F (37.4 C)] 98.1 F (36.7 C) (01/05 0627) Pulse Rate:  [75-80] 75 (01/05 0627) Cardiac Rhythm:  [-] Normal sinus rhythm (01/04 2006) Resp:  [17-18] 18 (01/05 0627) BP: (116-128)/(54-58) 128/58 mmHg (01/05 0627) SpO2:  [98 %-100 %] 99 % (01/05 0627) Weight:  [179 lb 7.3 oz (81.4 kg)] 179 lb 7.3 oz (81.4 kg) (01/05 0627)  Pre op weight 82 kg Current Weight  12/22/15 179 lb 7.3 oz (81.4 kg)      Intake/Output from previous day: 01/04 0701 - 01/05 0700 In: 360 [P.O.:360] Out: -    Physical Exam:  Cardiovascular: RRR Pulmonary: Clear to auscultation bilaterally; no rales, wheezes, or rhonchi. Abdomen: Soft, non tender, bowel sounds present. Extremities: No lower extremity edema. Wounds: Clean and dry.  No erythema or signs of infection.  Lab Results: CBC:No results for input(s): WBC, HGB, HCT, PLT in the last 72 hours. BMET: No results for input(s): NA, K, CL, CO2, GLUCOSE, BUN, CREATININE, CALCIUM in the last 72 hours.  PT/INR:  Lab Results  Component Value Date   INR 1.39 12/22/2015   INR 1.29 12/21/2015   INR 1.16 12/20/2015   ABG:  INR: Will add last result for INR, ABG once components are confirmed Will add last 4 CBG results once components are confirmed  Assessment/Plan:  1. CV - Maintaining SR. On Toprol XL 25 mg daily, Cozaar 50 mg daily, and Coumadin. INR slightly increased from 1.29 to 1.39. Continue with Coumadin 7.5 mg. 2.  Pulmonary - On room air. Continue with Coumadin. 4.  Acute blood loss anemia - Last H and H stable 9.7 28.6 5. Will discuss discharge disposition with Dr.  Roxan Hockey.  ZIMMERMAN,DONIELLE MPA-C 12/22/2015,7:27 AM

## 2015-12-23 ENCOUNTER — Ambulatory Visit (INDEPENDENT_AMBULATORY_CARE_PROVIDER_SITE_OTHER): Payer: Medicare Other | Admitting: Cardiology

## 2015-12-23 ENCOUNTER — Emergency Department (HOSPITAL_COMMUNITY)
Admission: EM | Admit: 2015-12-23 | Discharge: 2015-12-24 | Disposition: A | Discharge status: Home / Self Care | Payer: Medicare Other | Attending: Emergency Medicine | Admitting: Emergency Medicine

## 2015-12-23 ENCOUNTER — Encounter (HOSPITAL_COMMUNITY): Payer: Self-pay | Admitting: Emergency Medicine

## 2015-12-23 ENCOUNTER — Emergency Department (HOSPITAL_COMMUNITY)
Admission: EM | Admit: 2015-12-23 | Discharge: 2015-12-23 | Discharge status: Against Medical Advice | Payer: Medicare Other | Source: Home / Self Care

## 2015-12-23 DIAGNOSIS — I129 Hypertensive chronic kidney disease with stage 1 through stage 4 chronic kidney disease, or unspecified chronic kidney disease: Secondary | ICD-10-CM

## 2015-12-23 DIAGNOSIS — I97618 Postprocedural hemorrhage and hematoma of a circulatory system organ or structure following other circulatory system procedure: Secondary | ICD-10-CM | POA: Insufficient documentation

## 2015-12-23 DIAGNOSIS — Z952 Presence of prosthetic heart valve: Secondary | ICD-10-CM | POA: Diagnosis not present

## 2015-12-23 DIAGNOSIS — I509 Heart failure, unspecified: Secondary | ICD-10-CM

## 2015-12-23 DIAGNOSIS — Z48812 Encounter for surgical aftercare following surgery on the circulatory system: Secondary | ICD-10-CM | POA: Diagnosis not present

## 2015-12-23 DIAGNOSIS — E785 Hyperlipidemia, unspecified: Secondary | ICD-10-CM | POA: Diagnosis not present

## 2015-12-23 DIAGNOSIS — Z8719 Personal history of other diseases of the digestive system: Secondary | ICD-10-CM | POA: Insufficient documentation

## 2015-12-23 DIAGNOSIS — H409 Unspecified glaucoma: Secondary | ICD-10-CM | POA: Diagnosis not present

## 2015-12-23 DIAGNOSIS — Z87891 Personal history of nicotine dependence: Secondary | ICD-10-CM | POA: Diagnosis not present

## 2015-12-23 DIAGNOSIS — N183 Chronic kidney disease, stage 3 (moderate): Secondary | ICD-10-CM

## 2015-12-23 DIAGNOSIS — F329 Major depressive disorder, single episode, unspecified: Secondary | ICD-10-CM | POA: Insufficient documentation

## 2015-12-23 DIAGNOSIS — Z7901 Long term (current) use of anticoagulants: Secondary | ICD-10-CM | POA: Diagnosis not present

## 2015-12-23 DIAGNOSIS — Z5181 Encounter for therapeutic drug level monitoring: Secondary | ICD-10-CM

## 2015-12-23 DIAGNOSIS — R011 Cardiac murmur, unspecified: Secondary | ICD-10-CM | POA: Insufficient documentation

## 2015-12-23 DIAGNOSIS — Z9889 Other specified postprocedural states: Secondary | ICD-10-CM | POA: Insufficient documentation

## 2015-12-23 DIAGNOSIS — L7622 Postprocedural hemorrhage and hematoma of skin and subcutaneous tissue following other procedure: Secondary | ICD-10-CM | POA: Diagnosis not present

## 2015-12-23 DIAGNOSIS — Z954 Presence of other heart-valve replacement: Secondary | ICD-10-CM | POA: Diagnosis not present

## 2015-12-23 DIAGNOSIS — E039 Hypothyroidism, unspecified: Secondary | ICD-10-CM | POA: Insufficient documentation

## 2015-12-23 DIAGNOSIS — J90 Pleural effusion, not elsewhere classified: Secondary | ICD-10-CM | POA: Diagnosis not present

## 2015-12-23 DIAGNOSIS — Z79899 Other long term (current) drug therapy: Secondary | ICD-10-CM | POA: Diagnosis not present

## 2015-12-23 DIAGNOSIS — Z7982 Long term (current) use of aspirin: Secondary | ICD-10-CM | POA: Diagnosis not present

## 2015-12-23 DIAGNOSIS — R000 Tachycardia, unspecified: Secondary | ICD-10-CM | POA: Diagnosis not present

## 2015-12-23 DIAGNOSIS — I341 Nonrheumatic mitral (valve) prolapse: Secondary | ICD-10-CM | POA: Diagnosis not present

## 2015-12-23 LAB — BASIC METABOLIC PANEL
Anion gap: 9 (ref 5–15)
BUN: 10 mg/dL (ref 6–20)
CHLORIDE: 103 mmol/L (ref 101–111)
CO2: 26 mmol/L (ref 22–32)
Calcium: 9.6 mg/dL (ref 8.9–10.3)
Creatinine, Ser: 0.88 mg/dL (ref 0.44–1.00)
Glucose, Bld: 123 mg/dL — ABNORMAL HIGH (ref 65–99)
POTASSIUM: 4.6 mmol/L (ref 3.5–5.1)
SODIUM: 138 mmol/L (ref 135–145)

## 2015-12-23 LAB — CBC WITH DIFFERENTIAL/PLATELET
BASOS ABS: 0 10*3/uL (ref 0.0–0.1)
BASOS PCT: 1 %
EOS ABS: 0.3 10*3/uL (ref 0.0–0.7)
Eosinophils Relative: 4 %
HCT: 30.2 % — ABNORMAL LOW (ref 36.0–46.0)
Hemoglobin: 10.1 g/dL — ABNORMAL LOW (ref 12.0–15.0)
Lymphocytes Relative: 30 %
Lymphs Abs: 1.9 10*3/uL (ref 0.7–4.0)
MCH: 31.8 pg (ref 26.0–34.0)
MCHC: 33.4 g/dL (ref 30.0–36.0)
MCV: 95 fL (ref 78.0–100.0)
MONO ABS: 0.9 10*3/uL (ref 0.1–1.0)
MONOS PCT: 14 %
Neutro Abs: 3.3 10*3/uL (ref 1.7–7.7)
Neutrophils Relative %: 51 %
PLATELETS: 319 10*3/uL (ref 150–400)
RBC: 3.18 MIL/uL — ABNORMAL LOW (ref 3.87–5.11)
RDW: 13.6 % (ref 11.5–15.5)
WBC: 6.4 10*3/uL (ref 4.0–10.5)

## 2015-12-23 LAB — PROTIME-INR
INR: 1.52 — AB (ref 0.00–1.49)
PROTHROMBIN TIME: 18.4 s — AB (ref 11.6–15.2)

## 2015-12-23 LAB — POCT INR
INR: 1.7
INR: 1.7

## 2015-12-23 NOTE — ED Notes (Signed)
Pt told this RN that she has had some chest discomfort while at [facility] earlier today at the ED, pt denies chest discomfort at this time.

## 2015-12-23 NOTE — ED Provider Notes (Signed)
CSN: [id number]     Arrival date & time 12/23/15  2245 History   First MD Initiated Contact with Patient 12/23/15 2306     Chief Complaint  Patient presents with  . Post-op Problem     (Consider location/radiation/quality/duration/timing/severity/associated sxs/prior Treatment) HPI  Maureen Keller is a 70 y.o. female with past medical history of recent aortic valve replacement presenting today with bleeding from her wound. Patient states she had surgery last week, she was placed on Coumadin 4 days ago. She was discharged home yesterday and noticed bleeding today. She denies any pain in the area. Bleeding is coming from the wound site on the right, she states the wounds on the left is normal. Her last INR is 1.9 which is checked by her home nurse. She denies bleeding from elsewhere. There are no further complaints.   10 Systems reviewed and are negative for acute change except as noted in the HPI.    Past Medical History  Diagnosis Date  . Mitral valve disorder     moderate MR on echo 07/2014  . Depression     PTSD  . Hyperlipidemia   . Aortic stenosis      moderate aortic insufficiency and moderate  aortic stenosis by echo 07/2014  . Hypothyroidism   . CHF (congestive heart failure) (HCC)     /Diastolic dysfunction  . Pulmonary hypertension ([place])   . Glaucoma   . AR (aortic regurgitation)   . Hypertension   . Complication of anesthesia     awakened durimg procedure- heart cath 11/2015.  woke up during buninectomy  . Heart murmur   . Shortness of breath dyspnea     with minimal exertion  . Chronic kidney disease (CKD), stage III (moderate)   . Constipation    Past Surgical History  Procedure Laterality Date  . Cataract extraction Bilateral   . Cardiac catheterization  11/04/2009    normal coronaries  . Carpal tunnel release Right 1982  . Cardiac catheterization N/A 11/30/2015    Procedure: Right/Left Heart Cath and Coronary Angiography;  Surgeon: Burnell Blanks,  MD;  Location: [facility];  Service: Cardiovascular;  Laterality: N/A;  . Colonoscopy    . Trigger finger release Bilateral     thumbs - 2 separtate surgeries  . Bunionectomy Right   . Toe surgery Right     2 nd toe  . Aortic valve replacement N/A 12/16/2015    Procedure: AORTIC VALVE REPLACEMENT (AVR);  Surgeon: Melrose Nakayama, MD;  Location: [facility];  Service: Open Heart Surgery;  Laterality: N/A;  Edwards Intuity Elite Aortic Valve 9mm  . Tee without cardioversion N/A 12/16/2015    Procedure: TRANSESOPHAGEAL ECHOCARDIOGRAM (TEE);  Surgeon: Melrose Nakayama, MD;  Location: [facility];  Service: Open Heart Surgery;  Laterality: N/A;   Family History  Problem Relation Age of Onset  . Hypertension Father   . Heart disease Father   . Diabetes Father   . Diabetes Mother   . Suicidality Mother 81   Social History  Substance Use Topics  . Smoking status: Former Smoker -- 12 years    Quit date: 10/29/1976  . Smokeless tobacco: Never Used  . Alcohol Use: Yes     Comment: Occ   OB History    No data available     Review of Systems    Allergies  Risperdal  Home Medications   Prior to Admission medications   Medication Sig Start Date End Date Taking? Authorizing  Provider  aspirin EC 81 MG EC tablet Take 1 tablet (81 mg total) by mouth daily. 12/22/15   Donielle Liston Alba, PA-C  lamoTRIgine (LAMICTAL) 100 MG tablet Take 100-200 mg by mouth 2 (two) times daily. 200mg  in the morning and 100mg  in the evening 09/15/13   Historical Provider, MD  latanoprost (XALATAN) 0.005 % ophthalmic solution Place 1 drop into both eyes at bedtime. 04/18/15   Historical Provider, MD  levothyroxine (SYNTHROID, LEVOTHROID) 200 MCG tablet Take 200 mcg by mouth daily. 05/09/15   Historical Provider, MD  losartan (COZAAR) 50 MG tablet Take 1 tablet (50 mg total) by mouth daily. 12/22/15   Donielle Liston Alba, PA-C  metoprolol succinate (TOPROL-XL) 25 MG 24 hr tablet Take 1 tablet (25 mg total) by  mouth daily. 07/11/15   Sueanne Margarita, MD  Multiple Vitamin (MULTIVITAMIN) tablet Take 1 tablet by mouth daily.    Historical Provider, MD  traMADol (ULTRAM) 50 MG tablet Take 1 tablet (50 mg total) by mouth every 6 (six) hours as needed for moderate pain. 12/22/15   Donielle Liston Alba, PA-C  traZODone (DESYREL) 100 MG tablet Take 200 mg by mouth at bedtime.  09/15/13   Historical Provider, MD  warfarin (COUMADIN) 7.5 MG tablet Take 1 tablet (7.5 mg total) by mouth daily at 6 PM. Or as directed. 12/22/15   Donielle Liston Alba, PA-C   BP 196/87 mmHg  Pulse 102  Temp(Src) 98.1 F (36.7 C) (Oral)  Resp 18  SpO2 98% Physical Exam  Constitutional: She is oriented to person, place, and time. She appears well-developed and well-nourished. No distress.  HENT:  Head: Normocephalic and atraumatic.  Nose: Nose normal.  Mouth/Throat: Oropharynx is clear and moist. No oropharyngeal exudate.  Eyes: Conjunctivae and EOM are normal. Pupils are equal, round, and reactive to light. No scleral icterus.  Neck: Normal range of motion. Neck supple. No JVD present. No tracheal deviation present. No thyromegaly present.  Cardiovascular: Normal rate, regular rhythm and normal heart sounds.  Exam reveals no gallop and no friction rub.   No murmur heard. tachycardic  Pulmonary/Chest: Effort normal and breath sounds normal. No respiratory distress. She has no wheezes. She exhibits no tenderness.  Abdominal: Soft. Bowel sounds are normal. She exhibits no distension and no mass. There is no tenderness. There is no rebound and no guarding.  Musculoskeletal: Normal range of motion. She exhibits no edema or tenderness.  Lymphadenopathy:    She has no cervical adenopathy.  Neurological: She is alert and oriented to person, place, and time. No cranial nerve deficit. She exhibits normal muscle tone.  Skin: Skin is warm and dry. No rash noted. No erythema. No pallor.  2 wounds seen in epigastric area covered with steri  strips.  L is C/D/I with no bleeding.  R side has mild dark red oozing.  There is no tenderness to palpation  Nursing note and vitals reviewed.   ED Course  Procedures (including critical care time) Labs Review Labs Reviewed  CBC WITH DIFFERENTIAL/PLATELET - Abnormal; Notable for the following:    RBC 3.18 (*)    Hemoglobin 10.1 (*)    HCT 30.2 (*)    All other components within normal limits  PROTIME-INR - Abnormal; Notable for the following:    Prothrombin Time 18.4 (*)    INR 1.52 (*)    All other components within normal limits  BASIC METABOLIC PANEL - Abnormal; Notable for the following:    Glucose, Bld 123 (*)  All other components within normal limits    Imaging Review Dg Chest 2 View  12/24/2015  CLINICAL DATA:  Status post open heart surgery for aortic valve replacement. Drainage from right-sided wound. Initial encounter. EXAM: CHEST  2 VIEW COMPARISON:  Chest radiograph performed 12/19/2015 FINDINGS: A small right pleural effusion is noted. Mild atelectasis is noted at the left lung base. There is mild elevation of the left hemidiaphragm. There is no evidence of pneumothorax. The heart is normal in size. The patient is status post median sternotomy. No acute osseous abnormalities are seen. IMPRESSION: Small right pleural effusion noted. Mild atelectasis at the left lung base. Mild elevation of the left hemidiaphragm. Electronically Signed   By: Garald Balding M.D.   On: 12/24/2015 00:23   I have personally reviewed and evaluated these images and lab results as part of my medical decision-making.   EKG Interpretation   Date/Time:  Friday December 23 2015 23:12:34 EST Ventricular Rate:  82 PR Interval:  161 QRS Duration: 78 QT Interval:  349 QTC Calculation: 407 R Axis:   -9 Text Interpretation:  Sinus rhythm Abnormal R-wave progression, early  transition LVH with secondary repolarization abnormality Baseline wander  in lead(s) II III aVF new TWI lateral leads  Confirmed by Glynn Octave [phone]) on 12/23/2015 11:19:49 PM      MDM   Final diagnoses:  None   Patient presents to the ED for postop bleeding.  May be due to recent coumadin therapy as well.  Combat gauze and pressure applied.  Labs and cxr ordered.  Will consult with CT surgery.  EKG is unremarkable.  I spoke with Dr. Roxy Manns with CT surgery who states this is likely bleeding from removing he stiches and having some of the normal postop drainage coming out.  INF is 1.57 and patient informed of results and need to follow up for medication dosing change.  Hgb is stable at 10.  Upon repeat evaluation, bleeding has stopped.  Will given patient additional combat gauze to use at home.  Dr. Roxy Manns recs for the patient to be seen in clinic on Monday (2 days) first thing in the morning at 9am.  Patient agrees with plan.  She appears well and in NAD.  VS remain within her normal limits and she is safe for DC.   Everlene Balls, MD 12/24/15 [phone]

## 2015-12-23 NOTE — ED Notes (Signed)
Upon entering the triage room pt states that she was just discharged from having open heart surgery and is bleeding from the incision sites; pt was asked if she called her cardiologist and pt asked if he would come here; RN advised that the cardiologists are at [facility] and that if she needed to see them we would transfer her after she is evaluated; pt states "I will just go to [facility] now" RN advised that we are more than happy to see her here and transfer if she needs that level of care; pt states "No I am going to go over there" and walks out of triage

## 2015-12-23 NOTE — ED Notes (Signed)
Patient here post open heart surgery for aortic valve replacement. States that her drains were removed leaving two wounds which had stitches in place. The stitches were removed yesterday. Currently the left side wound is not draining, but the right side wound is draining clear/pink fluid.

## 2015-12-24 ENCOUNTER — Emergency Department (HOSPITAL_COMMUNITY): Payer: Medicare Other

## 2015-12-24 DIAGNOSIS — L7622 Postprocedural hemorrhage and hematoma of skin and subcutaneous tissue following other procedure: Secondary | ICD-10-CM | POA: Diagnosis not present

## 2015-12-24 DIAGNOSIS — J90 Pleural effusion, not elsewhere classified: Secondary | ICD-10-CM | POA: Diagnosis not present

## 2015-12-24 DIAGNOSIS — Z952 Presence of prosthetic heart valve: Secondary | ICD-10-CM | POA: Diagnosis not present

## 2015-12-24 MED ORDER — TRAMADOL HCL 50 MG PO TABS
50.0000 mg | ORAL_TABLET | Freq: Once | ORAL | Status: AC
  Administered 2015-12-24: 50 mg via ORAL
  Filled 2015-12-24: qty 1

## 2015-12-24 NOTE — ED Notes (Signed)
Pt back in room.

## 2015-12-24 NOTE — Discharge Instructions (Signed)
Aortic Valve Replacement, Care After Maureen Keller, use the dressing we gave you and change it twice per day until you see your physician Monday at [facility].  If any symptoms worsen, come back to the ED immediately. Thank you.   Refer to this sheet in the next few weeks. These instructions provide you with information on caring for yourself after your procedure. Your health care provider may also give you specific instructions. Your treatment has been planned according to current medical practices, but problems sometimes occur. Call your health care provider if you have any problems or questions after your procedure. HOME CARE INSTRUCTIONS   Take medicines only as directed by your health care provider.  If your health care provider has prescribed elastic stockings, wear them as directed.  Take frequent naps or rest often throughout the day.  Avoid lifting over 10 lbs (4.5 kg) or pushing or pulling things with your arms for 6-8 weeks or as directed by your health care provider.  Avoid driving or airplane travel for 4-6 weeks after surgery or as directed by your health care provider. If you are riding in a car for an extended period, stop every 1-2 hours to stretch your legs. Keep a record of your medicines and medical history with you when traveling.  Do not drive or operate heavy machinery while taking pain medicine. (narcotics).  Do not cross your legs.  Do not use any tobacco products including cigarettes, chewing tobacco, or electronic cigarettes. If you need help quitting, ask your health care provider.  Do not take baths, swim, or use a hot tub until your health care provider approves. Take showers once your health care provider approves. Pat incisions dry. Do not rub incisions with a washcloth or towel.  Avoid climbing stairs and using the handrail to pull yourself up for the first 2-3 weeks after surgery.  Return to work as directed by your health care provider.  Drink enough fluid to  keep your urine clear or pale yellow.  Do not strain to have a bowel movement. Eat high-fiber foods if you become constipated. You may also take a medicine to help you have a bowel movement (laxative) as directed by your health care provider.  Resume sexual activity as directed by your health care provider. Men should not use medicines for erectile dysfunction until their doctor says it isokay.  If you had a certain type of heart condition in the past, you may need to take antibiotic medicine before having dental work or surgery. Let your dentist and health care providers know if you had one or more of the following:  Previous endocarditis.  An artificial (prosthetic) heart valve.  Congenital heart disease. SEEK MEDICAL CARE IF:  You develop a skin rash.   You experience sudden changes in your weight.  You have a fever. SEEK IMMEDIATE MEDICAL CARE IF:   You develop chest pain that is not coming from your incision.  You have drainage (pus), redness, swelling, or pain at your incision site.   You develop shortness of breath or have difficulty breathing.   You have increased bleeding from your incision site.   You develop light-headedness.  MAKE SURE YOU:   Understand these directions.  Will watch your condition.  Will get help right away if you are not doing well or get worse.   This information is not intended to replace advice given to you by your health care provider. Make sure you discuss any questions you have with your health  care provider.   Document Released: 06/21/2005 Document Revised: 12/24/2014 Document Reviewed: 09/16/2012 Elsevier Interactive Patient Education [employer].

## 2015-12-26 ENCOUNTER — Telehealth: Payer: Self-pay | Admitting: Cardiology

## 2015-12-26 ENCOUNTER — Ambulatory Visit (INDEPENDENT_AMBULATORY_CARE_PROVIDER_SITE_OTHER): Payer: Medicare Other | Admitting: Interventional Cardiology

## 2015-12-26 DIAGNOSIS — I509 Heart failure, unspecified: Secondary | ICD-10-CM | POA: Diagnosis not present

## 2015-12-26 DIAGNOSIS — Z48812 Encounter for surgical aftercare following surgery on the circulatory system: Secondary | ICD-10-CM | POA: Diagnosis not present

## 2015-12-26 DIAGNOSIS — Z954 Presence of other heart-valve replacement: Secondary | ICD-10-CM

## 2015-12-26 DIAGNOSIS — N183 Chronic kidney disease, stage 3 (moderate): Secondary | ICD-10-CM | POA: Diagnosis not present

## 2015-12-26 DIAGNOSIS — Z952 Presence of prosthetic heart valve: Secondary | ICD-10-CM

## 2015-12-26 DIAGNOSIS — E785 Hyperlipidemia, unspecified: Secondary | ICD-10-CM | POA: Diagnosis not present

## 2015-12-26 DIAGNOSIS — Z5181 Encounter for therapeutic drug level monitoring: Secondary | ICD-10-CM

## 2015-12-26 DIAGNOSIS — I129 Hypertensive chronic kidney disease with stage 1 through stage 4 chronic kidney disease, or unspecified chronic kidney disease: Secondary | ICD-10-CM | POA: Diagnosis not present

## 2015-12-26 LAB — POCT INR: INR: 1.4

## 2015-12-26 NOTE — Telephone Encounter (Signed)
New problem   Pt need to speak to nurse concerning some medications that has been discontinued that she is taking. Please call pt.

## 2015-12-26 NOTE — Telephone Encounter (Signed)
Reviewed current medication list with patient. Patient grateful for callback.

## 2015-12-29 DIAGNOSIS — Z952 Presence of prosthetic heart valve: Secondary | ICD-10-CM | POA: Diagnosis not present

## 2015-12-29 DIAGNOSIS — I509 Heart failure, unspecified: Secondary | ICD-10-CM | POA: Diagnosis not present

## 2015-12-29 DIAGNOSIS — Z48812 Encounter for surgical aftercare following surgery on the circulatory system: Secondary | ICD-10-CM | POA: Diagnosis not present

## 2015-12-29 DIAGNOSIS — N183 Chronic kidney disease, stage 3 (moderate): Secondary | ICD-10-CM | POA: Diagnosis not present

## 2015-12-29 DIAGNOSIS — E785 Hyperlipidemia, unspecified: Secondary | ICD-10-CM | POA: Diagnosis not present

## 2015-12-29 DIAGNOSIS — I129 Hypertensive chronic kidney disease with stage 1 through stage 4 chronic kidney disease, or unspecified chronic kidney disease: Secondary | ICD-10-CM | POA: Diagnosis not present

## 2016-01-02 ENCOUNTER — Ambulatory Visit (INDEPENDENT_AMBULATORY_CARE_PROVIDER_SITE_OTHER): Payer: Medicare Other | Admitting: Internal Medicine

## 2016-01-02 DIAGNOSIS — I509 Heart failure, unspecified: Secondary | ICD-10-CM | POA: Diagnosis not present

## 2016-01-02 DIAGNOSIS — Z952 Presence of prosthetic heart valve: Secondary | ICD-10-CM | POA: Diagnosis not present

## 2016-01-02 DIAGNOSIS — I129 Hypertensive chronic kidney disease with stage 1 through stage 4 chronic kidney disease, or unspecified chronic kidney disease: Secondary | ICD-10-CM | POA: Diagnosis not present

## 2016-01-02 DIAGNOSIS — Z954 Presence of other heart-valve replacement: Secondary | ICD-10-CM

## 2016-01-02 DIAGNOSIS — Z48812 Encounter for surgical aftercare following surgery on the circulatory system: Secondary | ICD-10-CM | POA: Diagnosis not present

## 2016-01-02 DIAGNOSIS — N183 Chronic kidney disease, stage 3 (moderate): Secondary | ICD-10-CM | POA: Diagnosis not present

## 2016-01-02 DIAGNOSIS — E785 Hyperlipidemia, unspecified: Secondary | ICD-10-CM | POA: Diagnosis not present

## 2016-01-02 DIAGNOSIS — Z5181 Encounter for therapeutic drug level monitoring: Secondary | ICD-10-CM

## 2016-01-02 LAB — POCT INR: INR: 2.4

## 2016-01-03 DIAGNOSIS — F3342 Major depressive disorder, recurrent, in full remission: Secondary | ICD-10-CM | POA: Diagnosis not present

## 2016-01-09 ENCOUNTER — Ambulatory Visit (INDEPENDENT_AMBULATORY_CARE_PROVIDER_SITE_OTHER): Payer: Medicare Other | Admitting: Cardiovascular Disease

## 2016-01-09 DIAGNOSIS — Z954 Presence of other heart-valve replacement: Secondary | ICD-10-CM

## 2016-01-09 DIAGNOSIS — Z48812 Encounter for surgical aftercare following surgery on the circulatory system: Secondary | ICD-10-CM | POA: Diagnosis not present

## 2016-01-09 DIAGNOSIS — Z952 Presence of prosthetic heart valve: Secondary | ICD-10-CM

## 2016-01-09 DIAGNOSIS — E785 Hyperlipidemia, unspecified: Secondary | ICD-10-CM | POA: Diagnosis not present

## 2016-01-09 DIAGNOSIS — N183 Chronic kidney disease, stage 3 (moderate): Secondary | ICD-10-CM | POA: Diagnosis not present

## 2016-01-09 DIAGNOSIS — I509 Heart failure, unspecified: Secondary | ICD-10-CM | POA: Diagnosis not present

## 2016-01-09 DIAGNOSIS — I129 Hypertensive chronic kidney disease with stage 1 through stage 4 chronic kidney disease, or unspecified chronic kidney disease: Secondary | ICD-10-CM | POA: Diagnosis not present

## 2016-01-09 DIAGNOSIS — Z5181 Encounter for therapeutic drug level monitoring: Secondary | ICD-10-CM

## 2016-01-09 LAB — POCT INR: INR: 2.3

## 2016-01-11 NOTE — Progress Notes (Signed)
Cardiology Office Note   Date:  01/12/2016   ID:  Maureen Keller, DOB [birth year], MRN [id number]  PCP:  Kandice Hams, MD  Cardiologist:  Dr. Radford Pax   Post hospital follow up- s/p AVR    History of Present Illness: Maureen Keller is a 71 y.o. female with a history of HTN, severe AR and AS s/p bioprosthetic AVR, mild pulmonary hypertension, and G2DD who presents to clinic for post hospital follow up.  She recently saw Dr. Radford Pax on 05/12/15 for a scheduled follow-up visit. At that visit she complained of chest tightness and shortness of breath with exertion. This had been present for some time but had worsened just before she saw Dr. Radford Pax. A repeat echocardiogram showed progression from moderate to severe aortic stenosis and from moderate to moderately severe aortic insufficiency. She had only mild mitral regurgitation. Her peak gradient was 78 mmHg and mean gradient was 43 mmHg. She then underwent cardiac catheterization which revealed normal coronary arteries. Her gradients did not measure his high with a 11 mm mean gradient. Her valve area was calculated at 1.02 cm.  Dr. Roxan Hockey discussed with the patient that she has severe aortic stenosis and moderately severe aortic regurgitation with accelerating exertional angina. Aortic valve replacement was felt to be indicated in the setting of severe aortic stenosis with symptoms for survival benefit and symptom relief.  She was admitted on 12/16/15 in order to undergo an tissue AVR with 19 mm Edwards Intuity, Elite aortic valve.   Her post operative course was complicated by volume overload and she was diuresed.  She was started on Coumadin. INR goal is 2-2.5. Her last INR was up to 1.39. She is on Coumadin 7.5 mg daily. Discharged on 12/22/15.  Today presents to clinic for follow up. No chest pain. She does sometimes get some SOB when she is moving around the house, especially in the morning. It feels like a "raggedness of breath" that is  different than prior to her surgery. No LE edema, orthopnea, or PND. No dizziness or passing. No blood in stool or urine. She has been walking about 1/2 a mile outside everyday. She thinks she overdid it yesterday.  But no SOB or chest pain. Overall, feeling quite well and working on recovery.    Past Medical History  Diagnosis Date  . Mitral valve disorder     moderate MR on echo 07/2014  . Depression     PTSD  . Hyperlipidemia   . Aortic stenosis      moderate aortic insufficiency and moderate  aortic stenosis by echo 07/2014  . Hypothyroidism   . CHF (congestive heart failure) (HCC)     /Diastolic dysfunction  . Pulmonary hypertension ([place])   . Glaucoma   . AR (aortic regurgitation)   . Hypertension   . Complication of anesthesia     awakened durimg procedure- heart cath 11/2015.  woke up during buninectomy  . Heart murmur   . Shortness of breath dyspnea     with minimal exertion  . Chronic kidney disease (CKD), stage III (moderate)   . Constipation     Past Surgical History  Procedure Laterality Date  . Cataract extraction Bilateral   . Cardiac catheterization  11/04/2009    normal coronaries  . Carpal tunnel release Right 1982  . Cardiac catheterization N/A 11/30/2015    Procedure: Right/Left Heart Cath and Coronary Angiography;  Surgeon: Burnell Blanks, MD;  Location: [facility];  Service: Cardiovascular;  Laterality: N/A;  . Colonoscopy    . Trigger finger release Bilateral     thumbs - 2 separtate surgeries  . Bunionectomy Right   . Toe surgery Right     2 nd toe  . Aortic valve replacement N/A 12/16/2015    Procedure: AORTIC VALVE REPLACEMENT (AVR);  Surgeon: Melrose Nakayama, MD;  Location: [facility];  Service: Open Heart Surgery;  Laterality: N/A;  Edwards Intuity Elite Aortic Valve 26mm  . Tee without cardioversion N/A 12/16/2015    Procedure: TRANSESOPHAGEAL ECHOCARDIOGRAM (TEE);  Surgeon: Melrose Nakayama, MD;  Location: [facility];  Service:  Open Heart Surgery;  Laterality: N/A;     Current Outpatient Prescriptions  Medication Sig Dispense Refill  . aspirin EC 81 MG EC tablet Take 1 tablet (81 mg total) by mouth daily.    Marland Kitchen lamoTRIgine (LAMICTAL) 100 MG tablet Take 100-200 mg by mouth 2 (two) times daily. 200mg  in the morning and 100mg  in the evening    . latanoprost (XALATAN) 0.005 % ophthalmic solution Place 1 drop into both eyes at bedtime.    Marland Kitchen levothyroxine (SYNTHROID, LEVOTHROID) 200 MCG tablet Take 200 mcg by mouth daily.    Marland Kitchen losartan (COZAAR) 50 MG tablet Take 1 tablet (50 mg total) by mouth daily. 30 tablet 1  . metoprolol succinate (TOPROL-XL) 25 MG 24 hr tablet Take 1 tablet (25 mg total) by mouth daily. 30 tablet 11  . traZODone (DESYREL) 100 MG tablet Take 200 mg by mouth at bedtime.     Marland Kitchen warfarin (COUMADIN) 7.5 MG tablet Take 1 tablet (7.5 mg total) by mouth daily at 6 PM. Or as directed. 30 tablet 1   No current facility-administered medications for this visit.    Allergies:   Risperdal    Social History:  The patient  reports that she quit smoking about 39 years ago. She has never used smokeless tobacco. She reports that she drinks alcohol. She reports that she does not use illicit drugs.   Family History:  The patient's family history includes Diabetes in her father and mother; Heart disease in her father; Hypertension in her father; Suicidality (age of onset: 77) in her mother.    ROS:  Please see the history of present illness.   Otherwise, review of systems are positive for none.   All other systems are reviewed and negative.    PHYSICAL EXAM: VS:  BP 122/78 mmHg  Pulse 68  Ht 5\' 5"  (1.651 m)  Wt 180 lb 3.2 oz (81.738 kg)  BMI 29.99 kg/m2 , BMI Body mass index is 29.99 kg/(m^2). GEN: Well nourished, well developed, in no acute distress HEENT: normal Neck: no JVD, carotid bruits, or masses Cardiac: RRR; no murmurs, rubs, or gallops,no edema  Respiratory:  clear to auscultation bilaterally,  normal work of breathing GI: soft, nontender, nondistended, + BS MS: no deformity or atrophy Skin: warm and dry, no rash Neuro:  Strength and sensation are intact Psych: euthymic mood, full affect   EKG:  EKG is not ordered today.    Recent Labs: 12/11/2015: ALT 23; B Natriuretic Peptide 82.4 12/17/2015: Magnesium 2.0 12/23/2015: BUN 10; Creatinine, Ser 0.88; Hemoglobin 10.1*; Platelets 319; Potassium 4.6; Sodium 138    Lipid Panel No results found for: CHOL, TRIG, HDL, CHOLHDL, VLDL, LDLCALC, LDLDIRECT    Wt Readings from Last 3 Encounters:  01/12/16 180 lb 3.2 oz (81.738 kg)  12/22/15 179 lb 7.3 oz (81.4 kg)  12/15/15 179 lb 3.2 oz (81.285 kg)  Other studies Reviewed: Additional studies/ records that were reviewed today include: 2D ECHO, LHC Review of the above records demonstrates: see HPI   ASSESSMENT AND PLAN: Maureen Keller is a 69 y.o. female with a history of HTN, severe AR and AS s/p bioprosthetic AVR, mild pulmonary hypertension, and G2DD who presents to clinic for post hospital follow up.  Severe AS/AR s/p bioprosthetic AVR: now on Coumadin with INR goal 2.5- 3. Not sure if this is for 3 months or longer term.  INR 2.3 on 01/09/16. Will defer to [facility].   Chronic diastolic CHF: appears euvolemic on exam.   HTN: BP 122/78. Continue losartan 50mg  daily.   Current medicines are reviewed at length with the patient today.  The patient does not have concerns regarding medicines.  The following changes have been made:  no change  Labs/ tests ordered today include:  No orders of the defined types were placed in this encounter.     Disposition:   FU with Dr. Radford Pax in 3 months.   Maureen Keller  01/12/2016 9:50 AM    [facility] [place], [place], [place]  [address] Phone: [phone]; Fax: [phone]

## 2016-01-12 ENCOUNTER — Ambulatory Visit (INDEPENDENT_AMBULATORY_CARE_PROVIDER_SITE_OTHER): Payer: Medicare Other | Admitting: Physician Assistant

## 2016-01-12 ENCOUNTER — Encounter: Payer: Self-pay | Admitting: Physician Assistant

## 2016-01-12 VITALS — BP 122/78 | HR 68 | Ht 65.0 in | Wt 180.2 lb

## 2016-01-12 DIAGNOSIS — Z954 Presence of other heart-valve replacement: Secondary | ICD-10-CM | POA: Diagnosis not present

## 2016-01-12 DIAGNOSIS — I5032 Chronic diastolic (congestive) heart failure: Secondary | ICD-10-CM | POA: Diagnosis not present

## 2016-01-12 DIAGNOSIS — E1010 Type 1 diabetes mellitus with ketoacidosis without coma: Secondary | ICD-10-CM

## 2016-01-12 DIAGNOSIS — Z952 Presence of prosthetic heart valve: Secondary | ICD-10-CM

## 2016-01-12 NOTE — Patient Instructions (Addendum)
Medication Instructions:  Your physician recommends that you continue on your current medications as directed. Please refer to the Current Medication list given to you today.   Labwork: None ordered  Testing/Procedures: None ordered  Follow-Up: Your physician recommends that you schedule a follow-up appointment in: 3 MONTHS WITH DR. TURNER    Any Other Special Instructions Will Be Listed Below (If Applicable).     If you need a refill on your cardiac medications before your next appointment, please call your pharmacy.   

## 2016-01-16 ENCOUNTER — Other Ambulatory Visit: Payer: Self-pay | Admitting: Thoracic Surgery (Cardiothoracic Vascular Surgery)

## 2016-01-16 ENCOUNTER — Ambulatory Visit (INDEPENDENT_AMBULATORY_CARE_PROVIDER_SITE_OTHER): Payer: Medicare Other | Admitting: Pharmacist

## 2016-01-16 DIAGNOSIS — N183 Chronic kidney disease, stage 3 (moderate): Secondary | ICD-10-CM | POA: Diagnosis not present

## 2016-01-16 DIAGNOSIS — Z5181 Encounter for therapeutic drug level monitoring: Secondary | ICD-10-CM

## 2016-01-16 DIAGNOSIS — Z952 Presence of prosthetic heart valve: Secondary | ICD-10-CM

## 2016-01-16 DIAGNOSIS — I509 Heart failure, unspecified: Secondary | ICD-10-CM | POA: Diagnosis not present

## 2016-01-16 DIAGNOSIS — E785 Hyperlipidemia, unspecified: Secondary | ICD-10-CM | POA: Diagnosis not present

## 2016-01-16 DIAGNOSIS — I129 Hypertensive chronic kidney disease with stage 1 through stage 4 chronic kidney disease, or unspecified chronic kidney disease: Secondary | ICD-10-CM | POA: Diagnosis not present

## 2016-01-16 DIAGNOSIS — Z954 Presence of other heart-valve replacement: Secondary | ICD-10-CM

## 2016-01-16 DIAGNOSIS — Z48812 Encounter for surgical aftercare following surgery on the circulatory system: Secondary | ICD-10-CM | POA: Diagnosis not present

## 2016-01-16 LAB — POCT INR: INR: 2.4

## 2016-01-17 ENCOUNTER — Ambulatory Visit
Admission: RE | Admit: 2016-01-17 | Discharge: 2016-01-17 | Disposition: A | Discharge status: Home / Self Care | Payer: Medicare Other | Source: Ambulatory Visit | Attending: Thoracic Surgery (Cardiothoracic Vascular Surgery) | Admitting: Thoracic Surgery (Cardiothoracic Vascular Surgery)

## 2016-01-17 ENCOUNTER — Encounter: Payer: Self-pay | Admitting: Thoracic Surgery (Cardiothoracic Vascular Surgery)

## 2016-01-17 ENCOUNTER — Ambulatory Visit (INDEPENDENT_AMBULATORY_CARE_PROVIDER_SITE_OTHER): Payer: Self-pay | Admitting: Thoracic Surgery (Cardiothoracic Vascular Surgery)

## 2016-01-17 VITALS — BP 140/72 | HR 76 | Resp 20 | Ht 65.0 in | Wt 180.0 lb

## 2016-01-17 DIAGNOSIS — I350 Nonrheumatic aortic (valve) stenosis: Secondary | ICD-10-CM

## 2016-01-17 DIAGNOSIS — Z954 Presence of other heart-valve replacement: Secondary | ICD-10-CM

## 2016-01-17 DIAGNOSIS — J90 Pleural effusion, not elsewhere classified: Secondary | ICD-10-CM | POA: Diagnosis not present

## 2016-01-17 DIAGNOSIS — Z952 Presence of prosthetic heart valve: Secondary | ICD-10-CM

## 2016-01-17 DIAGNOSIS — I352 Nonrheumatic aortic (valve) stenosis with insufficiency: Secondary | ICD-10-CM

## 2016-01-17 NOTE — Progress Notes (Signed)
[address]       [place] [id number]             [phone]       HPI: Ms. Guilbert turns today for scheduled postoperative follow-up visit.  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 Postoperative course was uncomplicated and she was discharged home on 12/22/2015. She had issues with bleeding/drainage from one of her chest tube sites that required an ER visit. That subsequently resolved. Her most recent INR was 2.4 on 01/15/2015.  She feels well. Her exercise tolerance is improving. She does have discomfort if she tries to lie on her left or right side, but otherwise is not having significant pain. She has not had problems with swelling in her legs.  Patient Active Problem List   Diagnosis Date Noted  . Encounter for therapeutic drug monitoring 12/23/2015  . S/P AVR (aortic valve replacement) 12/16/2015  . DOE (dyspnea on exertion) 11/07/2015  . Chest pain 05/12/2015  . 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 Aortic stenosis   . Mitral valve disorder   . AR (aortic regurgitation)   . Hypertension       Current Outpatient Prescriptions  Medication Sig Dispense Refill  . aspirin EC 81 MG EC tablet Take 1 tablet (81 mg total) by mouth daily.    Marland Kitchen lamoTRIgine (LAMICTAL) 100 MG tablet Take 100-200 mg by mouth 2 (two) times daily. 200mg  in the morning and 100mg  in the evening    . latanoprost (XALATAN) 0.005 % ophthalmic solution Place 1 drop into both eyes at bedtime.    Marland Kitchen levothyroxine (SYNTHROID, LEVOTHROID) 200 MCG tablet Take 200 mcg by mouth daily.    Marland Kitchen losartan (COZAAR) 50 MG tablet Take 1 tablet (50 mg total) by mouth daily. 30 tablet 1  . metoprolol succinate (TOPROL-XL) 25 MG 24 hr tablet Take 1 tablet (25 mg total) by mouth daily. 30 tablet 11  . traZODone (DESYREL) 100 MG tablet Take 200 mg by mouth at bedtime.     Marland Kitchen warfarin (COUMADIN) 7.5 MG tablet  Take 1 tablet (7.5 mg total) by mouth daily at 6 PM. Or as directed. 30 tablet 1   No current facility-administered medications for this visit.    Physical Exam BP 140/72 mmHg  Pulse 76  Resp 20  Ht 5\' 5"  (1.651 m)  Wt 180 lb (81.647 kg)  BMI 29.95 kg/m2  SpO49 65% 70 year old woman in no acute distress Alert and oriented 3 with no focal deficits Lungs clear with breath sounds bilaterally Cardiac regular rate and rhythm normal S1 and S2, 2/6 systolic murmur Sternum stable, incision and chest tube sites healing well No peripheral edema  Diagnostic Tests: I personally reviewed her chest x-ray. It shows no significant effusions or infiltrates.  Impression: 70 year old woman who is now month out from an aortic valve replacement with a bovine pericardial valve. She did have a very small annulus. The 19 mm Intuity valve is equivalent of a 21 mm standard magna ease valve so should give her a good long-term result.  I have had her on Coumadin, due to her small annular size. Her INR is 2.4 which is fine for a pericardial aortic valve. She will stop the Coumadin at 6 weeks (2 weeks from now). She will continue the baby aspirin indefinitely.  She may begin driving. Appropriate precautions were discussed. She is not  lifting anything over 10 pounds for another 2 weeks. I did encourage her to gradually increase her activities.  Plan: Stop Coumadin in 2 weeks.  Follow-up with Dr. Radford Pax scheduled  I will be happy to see her back any time if I can be of any further assistance with her care.  Melrose Nakayama, MD [facility] [phone]

## 2016-01-17 NOTE — Patient Instructions (Addendum)
Stop coumadin in 2 weeks. Continue to take an aspirin a day after that.

## 2016-01-19 ENCOUNTER — Encounter (HOSPITAL_COMMUNITY)
Admission: RE | Admit: 2016-01-19 | Discharge: 2016-01-19 | Disposition: A | Discharge status: Home / Self Care | Payer: Medicare Other | Source: Ambulatory Visit | Attending: Cardiology | Admitting: Cardiology

## 2016-01-19 NOTE — Progress Notes (Signed)
Cardiac Rehab Medication Review by a Pharmacist  Does the patient  feel that his/her medications are working for him/her?  yes  Has the patient been experiencing any side effects to the medications prescribed?  no  Does the patient measure his/her own blood pressure at home?  no   Does the patient have any problems obtaining medications due to transportation or finances?   no  Understanding of regimen: good Understanding of indications: good Potential of compliance: excellent  Pharmacist comments: Ms. Breon is doing well with her medications. She denies any issues in regards to medication and has excellent compliance. She was encouraged to continue to keep up the great work and continue to take her medications as prescribed.  Dimitri Ped, PharmD. PGY-1 Pharmacy Resident Pager: [phone]  01/19/2016 8:13 AM

## 2016-01-20 ENCOUNTER — Ambulatory Visit: Payer: Self-pay | Admitting: Cardiology

## 2016-01-20 DIAGNOSIS — Z5181 Encounter for therapeutic drug level monitoring: Secondary | ICD-10-CM

## 2016-01-20 DIAGNOSIS — Z952 Presence of prosthetic heart valve: Secondary | ICD-10-CM

## 2016-01-23 ENCOUNTER — Telehealth (HOSPITAL_COMMUNITY): Payer: Self-pay | Admitting: Internal Medicine

## 2016-01-23 ENCOUNTER — Encounter (HOSPITAL_COMMUNITY): Payer: Medicare Other

## 2016-01-23 ENCOUNTER — Telehealth (HOSPITAL_COMMUNITY): Payer: Self-pay | Admitting: Cardiac Rehabilitation

## 2016-01-23 NOTE — Telephone Encounter (Signed)
pc received from pt requesting to cancel cardiac rehab classes. Pt is concerned about out of pocket expense due to secondary insurance policy denying hospital charges.  Pt does not wish to increase hospital bill voluntarily.  I offered to contact insurance company to verify insurance benefits for cardiac rehab, pt declined. I gave pt information about cardiac maintenance program. Pt declined offer at this time due to cost.  Pt instructed to contact cardiac rehab department if she would like to participate at a later time. Understanding verbalized.

## 2016-01-25 ENCOUNTER — Encounter (HOSPITAL_COMMUNITY): Payer: Medicare Other

## 2016-01-27 ENCOUNTER — Encounter (HOSPITAL_COMMUNITY): Payer: Medicare Other

## 2016-01-30 ENCOUNTER — Encounter (HOSPITAL_COMMUNITY): Payer: Self-pay | Admitting: Thoracic Surgery (Cardiothoracic Vascular Surgery)

## 2016-01-30 ENCOUNTER — Encounter (HOSPITAL_COMMUNITY): Payer: Medicare Other

## 2016-01-30 DIAGNOSIS — Z954 Presence of other heart-valve replacement: Secondary | ICD-10-CM | POA: Diagnosis not present

## 2016-01-30 DIAGNOSIS — I119 Hypertensive heart disease without heart failure: Secondary | ICD-10-CM | POA: Diagnosis not present

## 2016-01-30 DIAGNOSIS — I350 Nonrheumatic aortic (valve) stenosis: Secondary | ICD-10-CM | POA: Diagnosis not present

## 2016-01-30 DIAGNOSIS — E039 Hypothyroidism, unspecified: Secondary | ICD-10-CM | POA: Diagnosis not present

## 2016-01-30 DIAGNOSIS — Z1389 Encounter for screening for other disorder: Secondary | ICD-10-CM | POA: Diagnosis not present

## 2016-01-30 DIAGNOSIS — E785 Hyperlipidemia, unspecified: Secondary | ICD-10-CM | POA: Diagnosis not present

## 2016-01-30 DIAGNOSIS — I270 Primary pulmonary hypertension: Secondary | ICD-10-CM | POA: Diagnosis not present

## 2016-01-30 DIAGNOSIS — Z0000 Encounter for general adult medical examination without abnormal findings: Secondary | ICD-10-CM | POA: Diagnosis not present

## 2016-01-30 DIAGNOSIS — F4310 Post-traumatic stress disorder, unspecified: Secondary | ICD-10-CM | POA: Diagnosis not present

## 2016-01-30 DIAGNOSIS — I10 Essential (primary) hypertension: Secondary | ICD-10-CM | POA: Diagnosis not present

## 2016-01-30 DIAGNOSIS — H409 Unspecified glaucoma: Secondary | ICD-10-CM | POA: Diagnosis not present

## 2016-01-30 NOTE — Addendum Note (Signed)
Addendum  created 01/30/16 1156 by Rica Koyanagi, MD   Modules edited: Anesthesia Events

## 2016-02-01 ENCOUNTER — Encounter (HOSPITAL_COMMUNITY): Payer: Medicare Other

## 2016-02-03 ENCOUNTER — Encounter (HOSPITAL_COMMUNITY): Payer: Medicare Other

## 2016-02-06 ENCOUNTER — Encounter (HOSPITAL_COMMUNITY): Payer: Medicare Other

## 2016-02-08 ENCOUNTER — Encounter (HOSPITAL_COMMUNITY): Payer: Medicare Other

## 2016-02-10 ENCOUNTER — Encounter (HOSPITAL_COMMUNITY): Payer: Medicare Other

## 2016-02-13 ENCOUNTER — Encounter (HOSPITAL_COMMUNITY): Payer: Medicare Other

## 2016-02-15 ENCOUNTER — Encounter (HOSPITAL_COMMUNITY): Payer: Medicare Other

## 2016-02-17 ENCOUNTER — Encounter (HOSPITAL_COMMUNITY): Payer: Medicare Other

## 2016-02-20 ENCOUNTER — Encounter (HOSPITAL_COMMUNITY): Payer: Medicare Other

## 2016-02-22 ENCOUNTER — Encounter (HOSPITAL_COMMUNITY): Payer: Medicare Other

## 2016-02-24 ENCOUNTER — Encounter (HOSPITAL_COMMUNITY): Payer: Medicare Other

## 2016-02-24 ENCOUNTER — Other Ambulatory Visit: Payer: Self-pay | Admitting: *Deleted

## 2016-02-24 MED ORDER — LOSARTAN POTASSIUM 50 MG PO TABS: 50.0000 mg | ORAL_TABLET | Freq: Every day | ORAL | Status: DC

## 2016-02-27 ENCOUNTER — Other Ambulatory Visit: Payer: Self-pay | Admitting: Cardiology

## 2016-02-27 ENCOUNTER — Encounter (HOSPITAL_COMMUNITY): Payer: Medicare Other

## 2016-02-27 DIAGNOSIS — E039 Hypothyroidism, unspecified: Secondary | ICD-10-CM | POA: Diagnosis not present

## 2016-02-29 ENCOUNTER — Encounter (HOSPITAL_COMMUNITY): Payer: Medicare Other

## 2016-03-02 ENCOUNTER — Encounter (HOSPITAL_COMMUNITY): Payer: Medicare Other

## 2016-03-05 ENCOUNTER — Encounter (HOSPITAL_COMMUNITY): Payer: Medicare Other

## 2016-03-07 ENCOUNTER — Encounter (HOSPITAL_COMMUNITY): Payer: Medicare Other

## 2016-03-09 ENCOUNTER — Encounter (HOSPITAL_COMMUNITY): Payer: Medicare Other

## 2016-03-12 ENCOUNTER — Encounter (HOSPITAL_COMMUNITY): Payer: Medicare Other

## 2016-03-14 ENCOUNTER — Encounter (HOSPITAL_COMMUNITY): Payer: Medicare Other

## 2016-03-16 ENCOUNTER — Encounter (HOSPITAL_COMMUNITY): Payer: Medicare Other

## 2016-03-19 ENCOUNTER — Encounter (HOSPITAL_COMMUNITY): Payer: Medicare Other

## 2016-03-21 ENCOUNTER — Encounter (HOSPITAL_COMMUNITY): Payer: Medicare Other

## 2016-03-23 ENCOUNTER — Encounter (HOSPITAL_COMMUNITY): Payer: Medicare Other

## 2016-03-26 ENCOUNTER — Encounter (HOSPITAL_COMMUNITY): Payer: Medicare Other

## 2016-03-28 ENCOUNTER — Encounter (HOSPITAL_COMMUNITY): Payer: Medicare Other

## 2016-03-29 ENCOUNTER — Other Ambulatory Visit (HOSPITAL_COMMUNITY): Payer: Self-pay | Admitting: Internal Medicine

## 2016-03-29 ENCOUNTER — Ambulatory Visit (HOSPITAL_COMMUNITY)
Admission: RE | Admit: 2016-03-29 | Discharge: 2016-03-29 | Disposition: A | Discharge status: Home / Self Care | Payer: Medicare Other | Source: Ambulatory Visit | Attending: Internal Medicine | Admitting: Internal Medicine

## 2016-03-29 DIAGNOSIS — R0600 Dyspnea, unspecified: Secondary | ICD-10-CM

## 2016-03-29 DIAGNOSIS — R1310 Dysphagia, unspecified: Secondary | ICD-10-CM | POA: Diagnosis not present

## 2016-03-29 DIAGNOSIS — Z952 Presence of prosthetic heart valve: Secondary | ICD-10-CM | POA: Diagnosis not present

## 2016-03-29 DIAGNOSIS — R7989 Other specified abnormal findings of blood chemistry: Secondary | ICD-10-CM | POA: Diagnosis not present

## 2016-03-29 MED ORDER — IOHEXOL 350 MG/ML SOLN
100.0000 mL | Freq: Once | INTRAVENOUS | Status: AC | PRN
  Administered 2016-03-29: 100 mL via INTRAVENOUS

## 2016-03-30 ENCOUNTER — Encounter (HOSPITAL_COMMUNITY): Payer: Medicare Other

## 2016-04-01 ENCOUNTER — Encounter (HOSPITAL_COMMUNITY): Payer: Self-pay | Admitting: Emergency Medicine

## 2016-04-01 ENCOUNTER — Emergency Department (HOSPITAL_COMMUNITY)
Admission: EM | Admit: 2016-04-01 | Discharge: 2016-04-01 | Disposition: A | Discharge status: Home / Self Care | Payer: Medicare Other | Attending: Emergency Medicine | Admitting: Emergency Medicine

## 2016-04-01 ENCOUNTER — Emergency Department (HOSPITAL_COMMUNITY): Payer: Medicare Other

## 2016-04-01 DIAGNOSIS — Z8719 Personal history of other diseases of the digestive system: Secondary | ICD-10-CM | POA: Diagnosis not present

## 2016-04-01 DIAGNOSIS — Z9889 Other specified postprocedural states: Secondary | ICD-10-CM | POA: Diagnosis not present

## 2016-04-01 DIAGNOSIS — R0602 Shortness of breath: Secondary | ICD-10-CM | POA: Diagnosis not present

## 2016-04-01 DIAGNOSIS — Z7982 Long term (current) use of aspirin: Secondary | ICD-10-CM | POA: Diagnosis not present

## 2016-04-01 DIAGNOSIS — I129 Hypertensive chronic kidney disease with stage 1 through stage 4 chronic kidney disease, or unspecified chronic kidney disease: Secondary | ICD-10-CM | POA: Diagnosis not present

## 2016-04-01 DIAGNOSIS — I509 Heart failure, unspecified: Secondary | ICD-10-CM | POA: Insufficient documentation

## 2016-04-01 DIAGNOSIS — E039 Hypothyroidism, unspecified: Secondary | ICD-10-CM | POA: Diagnosis not present

## 2016-04-01 DIAGNOSIS — R011 Cardiac murmur, unspecified: Secondary | ICD-10-CM | POA: Insufficient documentation

## 2016-04-01 DIAGNOSIS — F329 Major depressive disorder, single episode, unspecified: Secondary | ICD-10-CM | POA: Insufficient documentation

## 2016-04-01 DIAGNOSIS — N183 Chronic kidney disease, stage 3 (moderate): Secondary | ICD-10-CM | POA: Diagnosis not present

## 2016-04-01 DIAGNOSIS — J209 Acute bronchitis, unspecified: Secondary | ICD-10-CM

## 2016-04-01 DIAGNOSIS — Z79899 Other long term (current) drug therapy: Secondary | ICD-10-CM | POA: Diagnosis not present

## 2016-04-01 DIAGNOSIS — H409 Unspecified glaucoma: Secondary | ICD-10-CM | POA: Insufficient documentation

## 2016-04-01 DIAGNOSIS — R0609 Other forms of dyspnea: Secondary | ICD-10-CM

## 2016-04-01 DIAGNOSIS — Z87891 Personal history of nicotine dependence: Secondary | ICD-10-CM | POA: Diagnosis not present

## 2016-04-01 DIAGNOSIS — R05 Cough: Secondary | ICD-10-CM | POA: Diagnosis not present

## 2016-04-01 LAB — BASIC METABOLIC PANEL
Anion gap: 11 (ref 5–15)
BUN: 11 mg/dL (ref 6–20)
CHLORIDE: 105 mmol/L (ref 101–111)
CO2: 20 mmol/L — ABNORMAL LOW (ref 22–32)
CREATININE: 0.85 mg/dL (ref 0.44–1.00)
Calcium: 9.4 mg/dL (ref 8.9–10.3)
GFR calc Af Amer: >60 mL/min (ref >60)
GFR calc non Af Amer: >60 mL/min (ref >60)
GLUCOSE: 110 mg/dL — AB (ref 65–99)
Potassium: 4.2 mmol/L (ref 3.5–5.1)
SODIUM: 136 mmol/L (ref 135–145)

## 2016-04-01 LAB — I-STAT TROPONIN, ED: Troponin i, poc: 0.01 ng/mL (ref 0.00–0.08)

## 2016-04-01 LAB — CBC
HCT: 45.1 % (ref 36.0–46.0)
Hemoglobin: 15 g/dL (ref 12.0–15.0)
MCH: 29.4 pg (ref 26.0–34.0)
MCHC: 33.3 g/dL (ref 30.0–36.0)
MCV: 88.3 fL (ref 78.0–100.0)
PLATELETS: 209 10*3/uL (ref 150–400)
RBC: 5.11 MIL/uL (ref 3.87–5.11)
RDW: 13.5 % (ref 11.5–15.5)
WBC: 5.5 10*3/uL (ref 4.0–10.5)

## 2016-04-01 LAB — BRAIN NATRIURETIC PEPTIDE: B NATRIURETIC PEPTIDE 5: 96.9 pg/mL (ref 0.0–100.0)

## 2016-04-01 MED ORDER — AZITHROMYCIN 250 MG PO TABS: 250.0000 mg | ORAL_TABLET | Freq: Every day | ORAL | Status: DC

## 2016-04-01 MED ORDER — IPRATROPIUM-ALBUTEROL 0.5-2.5 (3) MG/3ML IN SOLN
3.0000 mL | Freq: Once | RESPIRATORY_TRACT | Status: AC
  Administered 2016-04-01: 3 mL via RESPIRATORY_TRACT
  Filled 2016-04-01: qty 3

## 2016-04-01 MED ORDER — BENZONATATE 100 MG PO CAPS: 200.0000 mg | ORAL_CAPSULE | Freq: Two times a day (BID) | ORAL | Status: DC | PRN

## 2016-04-01 MED ORDER — GUAIFENESIN 100 MG/5ML PO LIQD: 100.0000 mg | ORAL | Status: DC | PRN

## 2016-04-01 MED ORDER — ALBUTEROL SULFATE HFA 108 (90 BASE) MCG/ACT IN AERS
2.0000 | INHALATION_SPRAY | RESPIRATORY_TRACT | Status: DC | PRN
  Administered 2016-04-01: 2 via RESPIRATORY_TRACT
  Filled 2016-04-01: qty 6.7

## 2016-04-01 MED ORDER — ACETAMINOPHEN 500 MG PO TABS
1000.0000 mg | ORAL_TABLET | Freq: Once | ORAL | Status: AC
  Administered 2016-04-01: 1000 mg via ORAL
  Filled 2016-04-01: qty 2

## 2016-04-01 MED ORDER — PREDNISONE 20 MG PO TABS: 40.0000 mg | ORAL_TABLET | Freq: Every day | ORAL | Status: DC

## 2016-04-01 MED ORDER — PREDNISONE 20 MG PO TABS
60.0000 mg | ORAL_TABLET | Freq: Once | ORAL | Status: AC
  Administered 2016-04-01: 60 mg via ORAL
  Filled 2016-04-01: qty 3

## 2016-04-01 NOTE — ED Notes (Signed)
PA Tapia at bedside.  

## 2016-04-01 NOTE — Discharge Instructions (Signed)
Acute Bronchitis Bronchitis is inflammation of the airways that extend from the windpipe into the lungs (bronchi). The inflammation often causes mucus to develop. This leads to a cough, which is the most common symptom of bronchitis.  In acute bronchitis, the condition usually develops suddenly and goes away over time, usually in a couple weeks. Smoking, allergies, and asthma can make bronchitis worse. Repeated episodes of bronchitis may cause further lung problems.  CAUSES Acute bronchitis is most often caused by the same virus that causes a cold. The virus can spread from person to person (contagious) through coughing, sneezing, and touching contaminated objects. SIGNS AND SYMPTOMS   Cough.   Fever.   Coughing up mucus.   Body aches.   Chest congestion.   Chills.   Shortness of breath.   Sore throat.  DIAGNOSIS  Acute bronchitis is usually diagnosed through a physical exam. Your health care provider will also ask you questions about your medical history. Tests, such as chest X-rays, are sometimes done to rule out other conditions.  TREATMENT  Acute bronchitis usually goes away in a couple weeks. Oftentimes, no medical treatment is necessary. Medicines are sometimes given for relief of fever or cough. Antibiotic medicines are usually not needed but may be prescribed in certain situations. In some cases, an inhaler may be recommended to help reduce shortness of breath and control the cough. A cool mist vaporizer may also be used to help thin bronchial secretions and make it easier to clear the chest.  HOME CARE INSTRUCTIONS  Get plenty of rest.   Drink enough fluids to keep your urine clear or pale yellow (unless you have a medical condition that requires fluid restriction). Increasing fluids may help thin your respiratory secretions (sputum) and reduce chest congestion, and it will prevent dehydration.   Take medicines only as directed by your health care provider.  If  you were prescribed an antibiotic medicine, finish it all even if you start to feel better.  Avoid smoking and secondhand smoke. Exposure to cigarette smoke or irritating chemicals will make bronchitis worse. If you are a smoker, consider using nicotine gum or skin patches to help control withdrawal symptoms. Quitting smoking will help your lungs heal faster.   Reduce the chances of another bout of acute bronchitis by washing your hands frequently, avoiding people with cold symptoms, and trying not to touch your hands to your mouth, nose, or eyes.   Keep all follow-up visits as directed by your health care provider.  SEEK MEDICAL CARE IF: Your symptoms do not improve after 1 week of treatment.  SEEK IMMEDIATE MEDICAL CARE IF:  You develop an increased fever or chills.   You have chest pain.   You have severe shortness of breath.  You have bloody sputum.   You develop dehydration.  You faint or repeatedly feel like you are going to pass out.  You develop repeated vomiting.  You develop a severe headache. MAKE SURE YOU:   Understand these instructions.  Will watch your condition.  Will get help right away if you are not doing well or get worse.   This information is not intended to replace advice given to you by your health care provider. Make sure you discuss any questions you have with your health care provider.   Document Released: 01/10/2005 Document Revised: 12/24/2014 Document Reviewed: 05/26/2013 Elsevier Interactive Patient Education 2016 [employer].   Shortness of Breath Shortness of breath means you have trouble breathing. It could also mean that you  have a medical problem. You should get immediate medical care for shortness of breath. CAUSES   Not enough oxygen in the air such as with high altitudes or a smoke-filled room.  Certain lung diseases, infections, or problems.  Heart disease or conditions, such as angina or heart failure.  Low red blood  cells (anemia).  Poor physical fitness, which can cause shortness of breath when you exercise.  Chest or back injuries or stiffness.  Being overweight.  Smoking.  Anxiety, which can make you feel like you are not getting enough air. DIAGNOSIS  Serious medical problems can often be found during your physical exam. Tests may also be done to determine why you are having shortness of breath. Tests may include:  Chest X-rays.  Lung function tests.  Blood tests.  An electrocardiogram (ECG).  An ambulatory electrocardiogram. An ambulatory ECG records your heartbeat patterns over a 24-hour period.  Exercise testing.  A transthoracic echocardiogram (TTE). During echocardiography, sound waves are used to evaluate how blood flows through your heart.  A transesophageal echocardiogram (TEE).  Imaging scans. Your health care provider may not be able to find a cause for your shortness of breath after your exam. In this case, it is important to have a follow-up exam with your health care provider as directed.  TREATMENT  Treatment for shortness of breath depends on the cause of your symptoms and can vary greatly. HOME CARE INSTRUCTIONS   Do not smoke. Smoking is a common cause of shortness of breath. If you smoke, ask for help to quit.  Avoid being around chemicals or things that may bother your breathing, such as paint fumes and dust.  Rest as needed. Slowly resume your usual activities.  If medicines were prescribed, take them as directed for the full length of time directed. This includes oxygen and any inhaled medicines.  Keep all follow-up appointments as directed by your health care provider. SEEK MEDICAL CARE IF:   Your condition does not improve in the time expected.  You have a hard time doing your normal activities even with rest.  You have any new symptoms. SEEK IMMEDIATE MEDICAL CARE IF:   Your shortness of breath gets worse.  You feel light-headed, faint, or  develop a cough not controlled with medicines.  You start coughing up blood.  You have pain with breathing.  You have chest pain or pain in your arms, shoulders, or abdomen.  You have a fever.  You are unable to walk up stairs or exercise the way you normally do. MAKE SURE YOU:  Understand these instructions.  Will watch your condition.  Will get help right away if you are not doing well or get worse.   This information is not intended to replace advice given to you by your health care provider. Make sure you discuss any questions you have with your health care provider.   Document Released: 08/28/2001 Document Revised: 12/08/2013 Document Reviewed: 02/18/2012 Elsevier Interactive Patient Education 2016 Elsevier Inc. Heart Failure Heart failure is a condition in which the heart has trouble pumping blood. This means your heart does not pump blood efficiently for your body to work well. In some cases of heart failure, fluid may back up into your lungs or you may have swelling (edema) in your lower legs. Heart failure is usually a long-term (chronic) condition. It is important for you to take good care of yourself and follow your health care provider's treatment plan. CAUSES  Some health conditions can cause heart failure.  Those health conditions include:  High blood pressure (hypertension). Hypertension causes the heart muscle to work harder than normal. When pressure in the blood vessels is high, the heart needs to pump (contract) with more force in order to circulate blood throughout the body. High blood pressure eventually causes the heart to become stiff and weak.  Coronary artery disease (CAD). CAD is the buildup of cholesterol and fat (plaque) in the arteries of the heart. The blockage in the arteries deprives the heart muscle of oxygen and blood. This can cause chest pain and may lead to a heart attack. High blood pressure can also contribute to CAD.  Heart attack (myocardial  infarction). A heart attack occurs when one or more arteries in the heart become blocked. The loss of oxygen damages the muscle tissue of the heart. When this happens, part of the heart muscle dies. The injured tissue does not contract as well and weakens the heart's ability to pump blood.  Abnormal heart valves. When the heart valves do not open and close properly, it can cause heart failure. This makes the heart muscle pump harder to keep the blood flowing.  Heart muscle disease (cardiomyopathy or myocarditis). Heart muscle disease is damage to the heart muscle from a variety of causes. These can include drug or alcohol abuse, infections, or unknown reasons. These can increase the risk of heart failure.  Lung disease. Lung disease makes the heart work harder because the lungs do not work properly. This can cause a strain on the heart, leading it to fail.  Diabetes. Diabetes increases the risk of heart failure. High blood sugar contributes to high fat (lipid) levels in the blood. Diabetes can also cause slow damage to tiny blood vessels that carry important nutrients to the heart muscle. When the heart does not get enough oxygen and food, it can cause the heart to become weak and stiff. This leads to a heart that does not contract efficiently.  Other conditions can contribute to heart failure. These include abnormal heart rhythms, thyroid problems, and low blood counts (anemia). Certain unhealthy behaviors can increase the risk of heart failure, including:  Being overweight.  Smoking or chewing tobacco.  Eating foods high in fat and cholesterol.  Abusing illicit drugs or alcohol.  Lacking physical activity. SYMPTOMS  Heart failure symptoms may vary and can be hard to detect. Symptoms may include:  Shortness of breath with activity, such as climbing stairs.  Persistent cough.  Swelling of the feet, ankles, legs, or abdomen.  Unexplained weight gain.  Difficulty breathing when lying  flat (orthopnea).  Waking from sleep because of the need to sit up and get more air.  Rapid heartbeat.  Fatigue and loss of energy.  Feeling light-headed, dizzy, or close to fainting.  Loss of appetite.  Nausea.  Increased urination during the night (nocturia). DIAGNOSIS  A diagnosis of heart failure is based on your history, symptoms, physical examination, and diagnostic tests. Diagnostic tests for heart failure may include:  Echocardiography.  Electrocardiography.  Chest X-ray.  Blood tests.  Exercise stress test.  Cardiac angiography.  Radionuclide scans. TREATMENT  Treatment is aimed at managing the symptoms of heart failure. Medicines, behavioral changes, or surgical intervention may be necessary to treat heart failure.  Medicines to help treat heart failure may include:  Angiotensin-converting enzyme (ACE) inhibitors. This type of medicine blocks the effects of a blood protein called angiotensin-converting enzyme. ACE inhibitors relax (dilate) the blood vessels and help lower blood pressure.  Angiotensin receptor blockers (  ARBs). This type of medicine blocks the actions of a blood protein called angiotensin. Angiotensin receptor blockers dilate the blood vessels and help lower blood pressure.  Water pills (diuretics). Diuretics cause the kidneys to remove salt and water from the blood. The extra fluid is removed through urination. This loss of extra fluid lowers the volume of blood the heart pumps.  Beta blockers. These prevent the heart from beating too fast and improve heart muscle strength.  Digitalis. This increases the force of the heartbeat.  Healthy behavior changes include:  Obtaining and maintaining a healthy weight.  Stopping smoking or chewing tobacco.  Eating heart-healthy foods.  Limiting or avoiding alcohol.  Stopping illicit drug use.  Physical activity as directed by your health care provider.  Surgical treatment for heart failure may  include:  A procedure to open blocked arteries, repair damaged heart valves, or remove damaged heart muscle tissue.  A pacemaker to improve heart muscle function and control certain abnormal heart rhythms.  An internal cardioverter defibrillator to treat certain serious abnormal heart rhythms.  A left ventricular assist device (LVAD) to assist the pumping ability of the heart. HOME CARE INSTRUCTIONS   Take medicines only as directed by your health care provider. Medicines are important in reducing the workload of your heart, slowing the progression of heart failure, and improving your symptoms.  Do not stop taking your medicine unless directed by your health care provider.  Do not skip any dose of medicine.  Refill your prescriptions before you run out of medicine. Your medicines are needed every day.  Engage in moderate physical activity if directed by your health care provider. Moderate physical activity can benefit some people. The elderly and people with severe heart failure should consult with a health care provider for physical activity recommendations.  Eat heart-healthy foods. Food choices should be free of trans fat and low in saturated fat, cholesterol, and salt (sodium). Healthy choices include fresh or frozen fruits and vegetables, fish, lean meats, legumes, fat-free or low-fat dairy products, and whole grain or high fiber foods. Talk to a dietitian to learn more about heart-healthy foods.  Limit sodium if directed by your health care provider. Sodium restriction may reduce symptoms of heart failure in some people. Talk to a dietitian to learn more about heart-healthy seasonings.  Use healthy cooking methods. Healthy cooking methods include roasting, grilling, broiling, baking, poaching, steaming, or stir-frying. Talk to a dietitian to learn more about healthy cooking methods.  Limit fluids if directed by your health care provider. Fluid restriction may reduce symptoms of heart  failure in some people.  Weigh yourself every day. Daily weights are important in the early recognition of excess fluid. You should weigh yourself every morning after you urinate and before you eat breakfast. Wear the same amount of clothing each time you weigh yourself. Record your daily weight. Provide your health care provider with your weight record.  Monitor and record your blood pressure if directed by your health care provider.  Check your pulse if directed by your health care provider.  Lose weight if directed by your health care provider. Weight loss may reduce symptoms of heart failure in some people.  Stop smoking or chewing tobacco. Nicotine makes your heart work harder by causing your blood vessels to constrict. Do not use nicotine gum or patches before talking to your health care provider.  Keep all follow-up visits as directed by your health care provider. This is important.  Limit alcohol intake to no more  than 1 drink per day for nonpregnant women and 2 drinks per day for men. One drink equals 12 ounces of beer, 5 ounces of wine, or 1 ounces of hard liquor. Drinking more than that is harmful to your heart. Tell your health care provider if you drink alcohol several times a week. Talk with your health care provider about whether alcohol is safe for you. If your heart has already been damaged by alcohol or you have severe heart failure, drinking alcohol should be stopped completely.  Stop illicit drug use.  Stay up-to-date with immunizations. It is especially important to prevent respiratory infections through current pneumococcal and influenza immunizations.  Manage other health conditions such as hypertension, diabetes, thyroid disease, or abnormal heart rhythms as directed by your health care provider.  Learn to manage stress.  Plan rest periods when fatigued.  Learn strategies to manage high temperatures. If the weather is extremely hot:  Avoid vigorous physical  activity.  Use air conditioning or fans or seek a cooler location.  Avoid caffeine and alcohol.  Wear loose-fitting, lightweight, and light-colored clothing.  Learn strategies to manage cold temperatures. If the weather is extremely cold:  Avoid vigorous physical activity.  Layer clothes.  Wear mittens or gloves, a hat, and a scarf when going outside.  Avoid alcohol.  Obtain ongoing education and support as needed.  Participate in or seek rehabilitation as needed to maintain or improve independence and quality of life. SEEK MEDICAL CARE IF:   You have a rapid weight gain.  You have increasing shortness of breath that is unusual for you.  You are unable to participate in your usual physical activities.  You tire easily.  You cough more than normal, especially with physical activity.  You have any or more swelling in areas such as your hands, feet, ankles, or abdomen.  You are unable to sleep because it is hard to breathe.  You feel like your heart is beating fast (palpitations).  You become dizzy or light-headed upon standing up. SEEK IMMEDIATE MEDICAL CARE IF:   You have difficulty breathing.  There is a change in mental status such as decreased alertness or difficulty with concentration.  You have a pain or discomfort in your chest.  You have an episode of fainting (syncope). MAKE SURE YOU:   Understand these instructions.  Will watch your condition.  Will get help right away if you are not doing well or get worse.   This information is not intended to replace advice given to you by your health care provider. Make sure you discuss any questions you have with your health care provider.   Document Released: 12/03/2005 Document Revised: 04/19/2015 Document Reviewed: 01/02/2013 Elsevier Interactive Patient Education [employer].

## 2016-04-01 NOTE — ED Notes (Signed)
Pt. reports worsening SOB with productive cough and chest congestion onset this week  , denies fever or chills.

## 2016-04-01 NOTE — ED Notes (Signed)
Dr. Leonides Schanz reviewed EKG .

## 2016-04-01 NOTE — ED Notes (Signed)
Ambulated pt in hallway. Pt states she had slight dizziness. Pt's HR 93-95, O2 sat 93-95% on room air. Pt states she would feel safe at home.

## 2016-04-01 NOTE — ED Provider Notes (Signed)
CSN: [id number]     Arrival date & time 04/01/16  0409 History   First MD Initiated Contact with Patient 04/01/16 [phone]     Chief Complaint  Patient presents with  . Shortness of Breath  . 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  She was seen by her PCP 3 days ago, she states that he set her lungs were clear, however she had elevated d-dimer and CT angio was ordered, which was negative for PE. 2 days ago she was outside working in the yard with her daughter when she developed a productive cough although she cannot describe the sputum, which was worse this morning with associated wheeze and central chest tightness. She denies fever, chills, sweats, orthopnea, PND, lower extremity edema, palpitations, chest pain, near-syncope. She does not have any history of COPD, quit smoking 45 years ago, no history of asthma. She denies any recent URI symptoms.  Past two days she's had mildly decreased appetite, but denies any other symptoms. No recent travel, no rash noted on the pain, nausea, vomiting, headache. She states that by February and March of this year she was back to walking several miles a day but then she began to have again gradually progressing exertional dyspnea, and cannot walk a mile anymore w/o becoming short of breath. Today walking back to the ER exam room she became short of breath, but she did not have to stop walking.  She explains that over the past 6 months she has had clean cardiac catheter, negative stress test, she has  never become hypoxic with various testing, but the test "wear her out," and she is experiencing generalized fatigue with her previously normal activities.   Past Medical History  Diagnosis Date  . Mitral valve disorder     moderate MR on echo 07/2014  . Depression     PTSD  . Hyperlipidemia   . Aortic stenosis      moderate aortic insufficiency and moderate  aortic stenosis by echo 07/2014  . Hypothyroidism   . CHF (congestive heart failure) (HCC)     /Diastolic dysfunction  . Pulmonary hypertension ([place])   . Glaucoma   . AR (aortic regurgitation)   . Hypertension   . Complication of anesthesia     awakened durimg procedure- heart cath 11/2015.  woke up during buninectomy  . Heart murmur   . Shortness of breath dyspnea     with minimal exertion  . Chronic kidney disease (CKD), stage III (moderate)   . Constipation    Past Surgical History  Procedure Laterality Date  . Cataract extraction Bilateral   . Cardiac catheterization  11/04/2009    normal coronaries  . Carpal tunnel release Right 1982  . Cardiac catheterization N/A 11/30/2015    Procedure: Right/Left Heart Cath and Coronary Angiography;  Surgeon: Burnell Blanks, MD;  Location: [facility];  Service: Cardiovascular;  Laterality: N/A;  . Colonoscopy    . Trigger finger release Bilateral     thumbs - 2 separtate surgeries  . Bunionectomy Right   . Toe surgery Right     2  nd toe  . Aortic valve replacement N/A 12/16/2015    Procedure: AORTIC VALVE REPLACEMENT (AVR);  Surgeon: Melrose Nakayama, MD;  Location: [facility];  Service: Open Heart Surgery;  Laterality: N/A;  Edwards Intuity Elite Aortic Valve 96mm  . Tee without cardioversion N/A 12/16/2015    Procedure: TRANSESOPHAGEAL ECHOCARDIOGRAM (TEE);  Surgeon: Melrose Nakayama, MD;  Location: [facility];  Service: Open Heart Surgery;  Laterality: N/A;   Family History  Problem Relation Age of Onset  . Hypertension Father   . Heart disease Father   .  Diabetes Father   . Diabetes Mother   . Suicidality Mother 64   Social History  Substance Use Topics  . Smoking status: Former Smoker -- 12 years    Quit date: 10/29/1976  . Smokeless tobacco: Never Used  . Alcohol Use: Yes     Comment: Occ   OB History    No data available     Review of Systems  All other systems reviewed and are negative.     Allergies  Risperdal  Home Medications   Prior to Admission medications   Medication Sig Start Date End Date Taking? Authorizing Provider  aspirin EC 81 MG EC tablet Take 1 tablet (81 mg total) by mouth daily. 12/22/15  Yes Donielle Liston Alba, PA-C  lamoTRIgine (LAMICTAL) 100 MG tablet Take 100-200 mg by mouth 2 (two) times daily. 200mg  in the morning and 100mg  in the evening 09/15/13  Yes Historical Provider, MD  latanoprost (XALATAN) 0.005 % ophthalmic solution Place 1 drop into both eyes at bedtime. 04/18/15  Yes Historical Provider, MD  levothyroxine (SYNTHROID, LEVOTHROID) 200 MCG tablet Take 150 mcg by mouth daily.  05/09/15  Yes Historical Provider, MD  losartan (COZAAR) 50 MG tablet Take 1 tablet (50 mg total) by mouth daily. 02/24/16  Yes Sueanne Margarita, MD  metoprolol succinate (TOPROL-XL) 25 MG 24 hr tablet Take 1 tablet (25 mg total) by mouth daily. 07/11/15  Yes Sueanne Margarita, MD  traZODone (DESYREL) 100 MG tablet Take 300 mg by mouth at bedtime.  09/15/13  Yes Historical Provider, MD  azithromycin (ZITHROMAX) 250 MG tablet Take 1 tablet (250 mg total) by mouth daily. Take first 2 tablets together, then 1 every day until finished. 04/01/16   Delsa Grana, PA-C  benzonatate (TESSALON) 100 MG capsule Take 2 capsules (200 mg total) by mouth 2 (two) times daily as needed for cough. 04/01/16   Delsa Grana, PA-C  guaiFENesin (ROBITUSSIN) 100 MG/5ML liquid Take 5-10 mLs (100-200 mg total) by mouth every 4 (four) hours as needed for cough. 04/01/16   Delsa Grana, PA-C  predniSONE (DELTASONE) 20 MG tablet Take 2 tablets (40 mg total) by mouth  daily. 04/01/16   Delsa Grana, PA-C   BP 118/58 mmHg  Pulse 83  Temp(Src) 98.5 F (36.9 C) (Oral)  Resp 18  Ht 5\' 4"  (1.626 m)  Wt 83.008 kg  BMI 31.40 kg/m2  SpO2 96% Physical Exam  Constitutional: She is oriented to person, place, and time. She appears well-developed and well-nourished. No distress.  HENT:  Head: Normocephalic and atraumatic.  Nose: Nose normal.  Mouth/Throat: Oropharynx is clear and moist. No oropharyngeal exudate.  Eyes: Conjunctivae and EOM are normal. Pupils are equal, round, and reactive to light. Right eye exhibits no discharge. Left eye exhibits no discharge. No scleral icterus.  Neck: Normal range of motion. No JVD present. No tracheal deviation present. No thyromegaly present.  Cardiovascular: Normal rate, regular rhythm and  intact distal pulses.  Exam reveals no gallop and no friction rub.   Murmur heard. Holosystolic murmur heard in the mitral area with radiation throughout the precordium, peripheral pulses symmetrical and 2+ radial and dorsal pedis, no lower extremity edema  Pulmonary/Chest: Effort normal. No respiratory distress. She has wheezes. She has no rales. She exhibits no tenderness.  Breath sounds diminished bilaterally lower to mid lung fields, faint intermittent rhonchi, and anteriorly expiratory wheeze, no retractions, patient able to speak in full complete sentences, no chest tenderness to palpation  Abdominal: Soft. Bowel sounds are normal. She exhibits no distension and no mass. There is no tenderness. There is no rebound and no guarding.  Musculoskeletal: Normal range of motion. She exhibits no edema or tenderness.  Lymphadenopathy:    She has no cervical adenopathy.  Neurological: She is alert and oriented to person, place, and time. She has normal reflexes. No cranial nerve deficit. She exhibits normal muscle tone. Coordination normal.  Skin: Skin is warm and dry. No rash noted. She is not diaphoretic. No erythema. No pallor.   Psychiatric: She has a normal mood and affect. Her behavior is normal. Judgment and thought content normal.  Nursing note and vitals reviewed.   ED Course  Procedures (including critical care time) Labs Review Labs Reviewed  BASIC METABOLIC PANEL - Abnormal; Notable for the following:    CO2 20 (*)    Glucose, Bld 110 (*)    All other components within normal limits  CBC  BRAIN NATRIURETIC PEPTIDE  I-STAT TROPOININ, ED     Imaging Review:  CLINICAL DATA: Elevated D-dimer and dyspnea.  These results will be called to the ordering clinician or representative by the Radiology Department at the imaging location.  EXAM: CT ANGIOGRAPHY CHEST WITH CONTRAST  TECHNIQUE: Multidetector CT imaging of the chest was performed using the standard protocol during bolus administration of intravenous contrast. Multiplanar CT image reconstructions and MIPs were obtained to evaluate the vascular anatomy.  CONTRAST: 181mL OMNIPAQUE IOHEXOL 350 MG/ML SOLN  COMPARISON: 05/20/2008  FINDINGS: THORACIC INLET/BODY WALL:  No acute abnormality.  MEDIASTINUM:  Normal heart size. No pericardial effusion. No acute vascular abnormality, including evidence of aortic dissection or pulmonary embolism. Aortic valve replacement. No evidence of para valvular collection. No adenopathy.  LUNG WINDOWS:  Stable mild scarring at the right middle lobe and lingula, stable. Mild left lower lobe atelectasis. There is no edema, consolidation, effusion, or pneumothorax.  UPPER ABDOMEN:  No acute findings.  OSSEOUS:  Patient's sternotomy does not have bony union but there is no disruption of the sternal wires and no fluid collection or acute inflammatory change.  Review of the MIP images confirms the above findings.  IMPRESSION: Negative for pulmonary embolism or other acute finding.   Electronically Signed  By: Monte Fantasia M.D.  On: 03/29/2016 17:58  Dg Chest 2  View  04/01/2016  CLINICAL DATA:  Initial evaluation for acute cough, congestion, and shortness of breath. EXAM: CHEST  2 VIEW COMPARISON:  Prior study from 01/17/2016. FINDINGS: Median sternotomy wires underlying prostatic valve noted. Cardiac and mediastinal silhouettes are within normal limits. Atheromatous plaque within the aortic arch. Mild elevation of the left hemidiaphragm. No focal infiltrate, pulmonary edema, or pleural effusion. No pneumothorax. No acute osseous abnormality. IMPRESSION: 1. No active cardiopulmonary disease. 2. Sequela of prior valve replacement. Electronically Signed   By: Jeannine Boga M.D.   On: 04/01/2016 05:09   ECHO 11/2015 reviewed  I have personally reviewed and evaluated these images and  lab results as part of my medical decision-making.  EKG: normal EKG, normal sinus rhythm, unchanged from previous tracings.   MDM   Patient with gradually progressing exertional dyspnea over the past 1-1/2 months. She recently had aortic valve replacement, December 2016, and recovered was able to walk 3 miles a day without shortness of breath by February and March of this year. She states that the last month and a half she has had slowly progressing exertional dyspnea return.   She had a negative CT angio, she was seen by her PCP 2 days ago without any findings or treatment, and then developed 2 days of cough, chest tightness and wheeze.  In the ER the patient has been hemodynamically stable, she has diminished breath sounds throughout with expiratory wheeze, primary nurse observed her being very dyspneic with minimal ambulation.  Patient was large unremarkable, negative BNP, negative troponin, negative chest x-ray. Wheezes cleared with DuoNebs and oral dose of prednisone.  On initial exam she felt warm to the touch, was given Tylenol and when later reexamined after nebs was diaphoretic, stated that she felt hot and then began to sweat.  At that time her wheeze had cleared,  feel she may have acute bronchitis, she reported improvement of cough and chest tightness after breathing tx.  Plan to treat patient with a steroid burst, inhaler, Tessalon Perles, Mucinex and given patient's significant cardiac history (multiple co-morbidities, major cardiac surgery, multiple admissions in the past 6 months) will give Z-Pak for antibiotic coverage.  The patient's gradual reprogression of exertional dyspnea may be related to diastolic dysfunction. She appears euvolemic today in the ER.  No concern for PE negative CTA 3 days ago.  Pt may be able to d/c home with close cardiology f/up, will ambulate pt with pulse ox. With ambulation, pt was able to maintain SpO2 @ 94%, but did experience dyspnea and dizziness with walking one ER hallway.  She states she felt like she was going to pass out.  Will consult cardiology  10:07 AM I spoke with Dr. Zoila Shutter re pt gradual and acute complaints, he has reviewed ER workup as well as recent CT. He agrees that her acute shortness of breath appears pulmonary, likely allergic bronchitis, agrees with sending pt home w/ inhaler. He states she appears stable to discharge home and follow up closely with Dr. Radford Pax, may need a repeat echo, however this can be done outpatient.  He requested orthostatics to possibly account for dizziness with walking.  Orthostatics ordered. Medications were reviewed with the cardiologist, he did not suggest any changes or new medicines at this time.  Final diagnoses:  Exertional dyspnea  Acute bronchitis, unspecified organism       Delsa Grana, PA-C 04/01/16 1134  Everlene Balls, MD 04/01/16 1725

## 2016-04-02 ENCOUNTER — Telehealth: Payer: Self-pay | Admitting: Cardiology

## 2016-04-02 ENCOUNTER — Encounter (HOSPITAL_COMMUNITY): Payer: Medicare Other

## 2016-04-02 LAB — POCT I-STAT CREATININE: Creatinine, Ser: 0.7 mg/dL (ref 0.44–1.00)

## 2016-04-02 NOTE — Telephone Encounter (Signed)
New message     Patient went to emergency room on  4.16.2017 - sob  &  could not stop coughing . Was advise to call the office to get another echo schedule .    Patient wants to be seen sooner with Dr. Radford Pax .   Patient aware Dr. Radford Pax next available in May.    Offer appt made with APP on  4.21.2017 @ 9 am same day Dr. Radford Pax in the office.

## 2016-04-02 NOTE — Telephone Encounter (Signed)
Patient c/o SOB that is getting increasingly worse. She st she is tired all the time and it is hard to get out of bed some mornings. She went to the ED yesterday and was instructed to call [facility]. A repeat ECHO was recommended secondary to symptoms and recent AVR (11/2015). The patient st she feels "fine" right now. She is not noticeably SOB on the phone. Scheduled patient 4/19 with Lyda Jester for evaluation. Patient was grateful for call.

## 2016-04-04 ENCOUNTER — Ambulatory Visit (INDEPENDENT_AMBULATORY_CARE_PROVIDER_SITE_OTHER): Payer: Medicare Other | Admitting: Cardiology

## 2016-04-04 ENCOUNTER — Encounter (HOSPITAL_COMMUNITY): Payer: Medicare Other

## 2016-04-04 ENCOUNTER — Encounter: Payer: Self-pay | Admitting: Cardiology

## 2016-04-04 VITALS — BP 122/80 | HR 76 | Ht 64.5 in | Wt 181.0 lb

## 2016-04-04 DIAGNOSIS — R0600 Dyspnea, unspecified: Secondary | ICD-10-CM | POA: Diagnosis not present

## 2016-04-04 DIAGNOSIS — Z952 Presence of prosthetic heart valve: Secondary | ICD-10-CM

## 2016-04-04 DIAGNOSIS — Z954 Presence of other heart-valve replacement: Secondary | ICD-10-CM

## 2016-04-04 MED ORDER — FUROSEMIDE 20 MG PO TABS: ORAL_TABLET | ORAL | Status: DC

## 2016-04-04 NOTE — Progress Notes (Signed)
04/04/2016 Maureen Keller   1945/12/21  [id number]  Primary Physician Kandice Hams, MD Primary Cardiologist: Dr. Radford Pax   Reason for Visit/CC: Dyspnea + Dry Cough  HPI:  70 y/o female, followed by Dr. Radford Pax, with h/o HTN, severe AR and AS s/p bioprosthetic AVR by Dr. Roxan Hockey 12/16/15, mild pulmonary hypertension, and G2DD. Prior to open heart surgery, she underwent a right and left heart catheterization by Dr. Angelena Form 11/30/2015 which showed normal coronaries.  She presents to [facility] with complaint of dyspnea that is progressively getting worse. First with exertion but now occurs at rest. She is tired all the time and it is hard to get out of bed some mornings. She also has a dry hacking cough. No chest pain, dizziness, syncope/near-syncope. No lower extremity edema.   She went to the ED 04/01/16 for evaluation. EKG showed NSR. Chest CT was negative for PE and no other acute findings. CXR showed no active cardiopulmonary disease. BNP was normal at 96.9. CBC was negative for anemia. Hgb was 15. WBC normal. Troponin negative. She was given a breathing treatment, prednisone, antibiotics and Robitussin in the ED. She notes some improvement with this but she continues to feel short of breath.   Current Outpatient Prescriptions  Medication Sig Dispense Refill  . aspirin EC 81 MG EC tablet Take 1 tablet (81 mg total) by mouth daily.    Marland Kitchen azithromycin (ZITHROMAX) 250 MG tablet Take 1 tablet (250 mg total) by mouth daily. Take first 2 tablets together, then 1 every day until finished. 6 tablet 0  . guaiFENesin (ROBITUSSIN) 100 MG/5ML liquid Take 5-10 mLs (100-200 mg total) by mouth every 4 (four) hours as needed for cough. 60 mL 0  . lamoTRIgine (LAMICTAL) 100 MG tablet Take 100-200 mg by mouth 2 (two) times daily. 200mg  in the morning and 100mg  in the evening    . latanoprost (XALATAN) 0.005 % ophthalmic solution Place 1 drop into both eyes at bedtime.    Marland Kitchen levothyroxine (SYNTHROID,  LEVOTHROID) 200 MCG tablet Take 150 mcg by mouth daily.     Marland Kitchen losartan (COZAAR) 50 MG tablet Take 1 tablet (50 mg total) by mouth daily. 30 tablet 2  . metoprolol succinate (TOPROL-XL) 25 MG 24 hr tablet Take 1 tablet (25 mg total) by mouth daily. 30 tablet 11  . predniSONE (DELTASONE) 20 MG tablet Take 2 tablets (40 mg total) by mouth daily. 10 tablet 0  . traZODone (DESYREL) 100 MG tablet Take 300 mg by mouth at bedtime.     . furosemide (LASIX) 20 MG tablet Take 1 tablet (20mg ) by mouth daily for 3 days 3 tablet 0   No current facility-administered medications for this visit.    Allergies  Allergen Reactions  . Risperdal [Risperidone]     sever mental and physical degeneration     Social History   Social History  . Marital Status: Single    Spouse Name: N/A  . Number of Children: N/A  . Years of Education: N/A   Occupational History  . Not on file.   Social History Main Topics  . Smoking status: Former Smoker -- 12 years    Quit date: 10/29/1976  . Smokeless tobacco: Never Used  . Alcohol Use: Yes     Comment: Occ  . Drug Use: No  . Sexual Activity: Not on file   Other Topics Concern  . Not on file   Social History Narrative     Review of Systems: General: negative for  chills, fever, night sweats or weight changes.  Cardiovascular: negative for chest pain, dyspnea on exertion, edema, orthopnea, palpitations, paroxysmal nocturnal dyspnea or shortness of breath Dermatological: negative for rash Respiratory: negative for cough or wheezing Urologic: negative for hematuria Abdominal: negative for nausea, vomiting, diarrhea, bright red blood per rectum, melena, or hematemesis Neurologic: negative for visual changes, syncope, or dizziness All other systems reviewed and are otherwise negative except as noted above.    Blood pressure 122/80, pulse 76, height 5' 4.5" (1.638 m), weight 181 lb (82.101 kg).  General appearance: alert, cooperative and no distress Neck: no  carotid bruit and no JVD Lungs: clear to auscultation bilaterally Heart: regular rate and rhythm and 2/6 SM at RUSB and LUSB Extremities: no LEE Pulses: 2+ and symmetric Skin: warm and dry Neurologic: Grossly normal  EKG not peformed.   ASSESSMENT AND PLAN:   1. Dyspnea: Recent workup to the [facility] emergency department was negative including normal chest x-ray, negative chest CT (no PE), normal EKG, normal BNP, CBC, BMP and troponin. She has no signs of volume overload. Given recent aortic valve replacement less than 6 months ago, we will order a 2-D echocardiogram to reassess the status of the aortic valve. We'll also rule out pericardial effusion as well as reassess left ventricular function. She had a recent left heart catheterization in December 2016 that showed normal coronaries, thus do not suspect ischemia as cause.  The sound of her cough  sounds more consistent with bronchitis. This may even be adult onset asthma. I recommended that she follow-up with her PCP for further management as well. Though I do not suspect that this is acute diastolic CHF exacerbation, I will prescribe a trial of diuretic therapy to see if this makes any difference. Will prescribe 20 mg of Lasix daily 3 days. Will prescribe only 3 tablets. She will notify our office if she has any improvement with this. We will obtain a follow-up BMP when she returns for her 2-D echocardiogram to ensure that her potassium level stable.  2. H/o AVR: performed 11/2015. Now with unexplained dyspnea. Will check 2D echo to assess status of tissue valve.   PLAN  Keep f/u with Dr. Radford Pax in May  Lyda Jester PA-C 04/04/2016 9:44 AM

## 2016-04-04 NOTE — Patient Instructions (Signed)
Medication Instructions:  Your physician has recommended you make the following change in your medication:  START Lasix 20mg  daily for 3 days. An Rx has been sent to your pharmacy  Labwork: Your physician recommends that you return for lab work Artist) same day as Echo   Testing/Procedures: Your physician has requested that you have an echocardiogram. Echocardiography is a painless test that uses sound waves to create images of your heart. It provides your doctor with information about the size and shape of your heart and how well your heart's chambers and valves are working. This procedure takes approximately one hour. There are no restrictions for this procedure. ( To be scheduled asap)  Follow-Up: Follow up as planned in May 2017 with Dr.Turner  Any Other Special Instructions Will Be Listed Below (If Applicable).     If you need a refill on your cardiac medications before your next appointment, please call your pharmacy.

## 2016-04-05 ENCOUNTER — Telehealth: Payer: Self-pay | Admitting: Cardiology

## 2016-04-05 DIAGNOSIS — H401132 Primary open-angle glaucoma, bilateral, moderate stage: Secondary | ICD-10-CM | POA: Diagnosis not present

## 2016-04-05 NOTE — Telephone Encounter (Signed)
New message  General message  Pt called states that she was asked during ov with PA Ellen Henri,  if she had woke up in a drenching sweat. Pt answered during the OV that she had not but she now remembers that this has happened on numerous occasions. She also states that she has had leg pains and the doward turn that was made happened after her tooth extraction. Request a call back to discuss

## 2016-04-06 ENCOUNTER — Encounter: Payer: Medicare Other | Admitting: Physician Assistant

## 2016-04-06 ENCOUNTER — Encounter (HOSPITAL_COMMUNITY): Payer: Medicare Other

## 2016-04-09 ENCOUNTER — Encounter (HOSPITAL_COMMUNITY): Payer: Medicare Other

## 2016-04-11 ENCOUNTER — Encounter (HOSPITAL_COMMUNITY): Payer: Medicare Other

## 2016-04-11 ENCOUNTER — Other Ambulatory Visit (INDEPENDENT_AMBULATORY_CARE_PROVIDER_SITE_OTHER): Payer: Medicare Other | Admitting: *Deleted

## 2016-04-11 ENCOUNTER — Ambulatory Visit (HOSPITAL_COMMUNITY)
Admission: RE | Admit: 2016-04-11 | Discharge: 2016-04-11 | Disposition: A | Discharge status: Home / Self Care | Payer: Medicare Other | Source: Ambulatory Visit | Attending: Cardiology | Admitting: Cardiology

## 2016-04-11 DIAGNOSIS — Z954 Presence of other heart-valve replacement: Secondary | ICD-10-CM

## 2016-04-11 DIAGNOSIS — I517 Cardiomegaly: Secondary | ICD-10-CM | POA: Insufficient documentation

## 2016-04-11 DIAGNOSIS — R0600 Dyspnea, unspecified: Secondary | ICD-10-CM

## 2016-04-11 DIAGNOSIS — I071 Rheumatic tricuspid insufficiency: Secondary | ICD-10-CM | POA: Insufficient documentation

## 2016-04-11 DIAGNOSIS — Z952 Presence of prosthetic heart valve: Secondary | ICD-10-CM

## 2016-04-11 DIAGNOSIS — I340 Nonrheumatic mitral (valve) insufficiency: Secondary | ICD-10-CM | POA: Insufficient documentation

## 2016-04-11 DIAGNOSIS — Z953 Presence of xenogenic heart valve: Secondary | ICD-10-CM | POA: Insufficient documentation

## 2016-04-11 LAB — BASIC METABOLIC PANEL
BUN: 14 mg/dL (ref 7–25)
CALCIUM: 9.2 mg/dL (ref 8.6–10.4)
CO2: 23 mmol/L (ref 20–31)
Chloride: 104 mmol/L (ref 98–110)
Creat: 0.95 mg/dL — ABNORMAL HIGH (ref 0.60–0.93)
GLUCOSE: 113 mg/dL — AB (ref 65–99)
POTASSIUM: 4.6 mmol/L (ref 3.5–5.3)
Sodium: 137 mmol/L (ref 135–146)

## 2016-04-11 NOTE — Addendum Note (Signed)
Addended by: Eulis Foster on: 04/11/2016 04:10 PM   Modules accepted: Orders

## 2016-04-11 NOTE — Progress Notes (Signed)
  Echocardiogram 2D Echocardiogram has been performed.  Bobbye Charleston 04/11/2016, 3:59 PM

## 2016-04-13 ENCOUNTER — Encounter (HOSPITAL_COMMUNITY): Payer: Medicare Other

## 2016-04-16 ENCOUNTER — Encounter (HOSPITAL_COMMUNITY): Payer: Medicare Other

## 2016-04-17 ENCOUNTER — Telehealth: Payer: Self-pay | Admitting: *Deleted

## 2016-04-17 DIAGNOSIS — E039 Hypothyroidism, unspecified: Secondary | ICD-10-CM | POA: Diagnosis not present

## 2016-04-17 DIAGNOSIS — R0600 Dyspnea, unspecified: Secondary | ICD-10-CM

## 2016-04-17 MED ORDER — FUROSEMIDE 20 MG PO TABS: ORAL_TABLET | ORAL | Status: DC

## 2016-04-17 NOTE — Telephone Encounter (Signed)
Pt aware of her echo results. New rx for Lasix 20 prn has been sent to [facility] Pt verbalized appreciation and understanding.

## 2016-04-17 NOTE — Telephone Encounter (Signed)
-----   Message from Consuelo Pandy, [place] sent at 04/16/2016  4:22 PM EDT ----- Aortic valve is functioning properly. Pump function is normal. No fluid surrounding the heart. She does have mild diastolic dysfunction, which means that the left bottom chamber of the heart dose not relax as well as it should. This is a common finding that occurs with age. It can lead to occasional volume overload/ fluid retention which can cause shortness of breath. Check with patient to see if lasix helped her breathing any. If so, she can continue to use as needed for this purpose. Her labs show normal kidney function and K level.

## 2016-04-18 ENCOUNTER — Encounter (HOSPITAL_COMMUNITY): Payer: Medicare Other

## 2016-04-20 ENCOUNTER — Encounter (HOSPITAL_COMMUNITY): Payer: Medicare Other

## 2016-04-23 ENCOUNTER — Encounter (HOSPITAL_COMMUNITY): Payer: Medicare Other

## 2016-04-25 ENCOUNTER — Encounter (HOSPITAL_COMMUNITY): Payer: Medicare Other

## 2016-04-27 ENCOUNTER — Encounter (HOSPITAL_COMMUNITY): Payer: Medicare Other

## 2016-05-09 DIAGNOSIS — R070 Pain in throat: Secondary | ICD-10-CM | POA: Diagnosis not present

## 2016-05-09 DIAGNOSIS — K219 Gastro-esophageal reflux disease without esophagitis: Secondary | ICD-10-CM | POA: Diagnosis not present

## 2016-05-09 DIAGNOSIS — R05 Cough: Secondary | ICD-10-CM | POA: Diagnosis not present

## 2016-05-10 ENCOUNTER — Encounter: Payer: Self-pay | Admitting: Cardiology

## 2016-05-10 ENCOUNTER — Ambulatory Visit (INDEPENDENT_AMBULATORY_CARE_PROVIDER_SITE_OTHER): Payer: Medicare Other | Admitting: Cardiology

## 2016-05-10 VITALS — BP 130/72 | HR 67 | Ht 64.5 in | Wt 183.8 lb

## 2016-05-10 DIAGNOSIS — I350 Nonrheumatic aortic (valve) stenosis: Secondary | ICD-10-CM

## 2016-05-10 DIAGNOSIS — I10 Essential (primary) hypertension: Secondary | ICD-10-CM

## 2016-05-10 DIAGNOSIS — I272 Other secondary pulmonary hypertension: Secondary | ICD-10-CM

## 2016-05-10 DIAGNOSIS — R0609 Other forms of dyspnea: Secondary | ICD-10-CM

## 2016-05-10 DIAGNOSIS — I351 Nonrheumatic aortic (valve) insufficiency: Secondary | ICD-10-CM

## 2016-05-10 DIAGNOSIS — I059 Rheumatic mitral valve disease, unspecified: Secondary | ICD-10-CM | POA: Diagnosis not present

## 2016-05-10 DIAGNOSIS — K219 Gastro-esophageal reflux disease without esophagitis: Secondary | ICD-10-CM

## 2016-05-10 NOTE — Progress Notes (Signed)
Cardiology Office Note    Date:  05/10/2016   ID:  Maureen Keller, DOB 11/13/1946, MRN [id number]  PCP:  Kandice Hams, MD  Cardiologist:  Fransico Him, MD   Chief Complaint  Patient presents with  . Aortic Stenosis  . Hypertension    History of Present Illness:  Maureen Keller is a 70 y.o. 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  She recently saw my PA in April due to acute SOB.  ER evaluation showed normal EKG, Chest CT neg for PE and normal BNP and CBC.  She was treated with steroids, antibiotics and breathing treatments and symptoms improved. She was given a short trial of diuretics due to history of diastolic dysfunction but this did not improve her SOB.  She saw an ENT and was diagnosed with GERD and was started on a PPI and just got it this am.  She is doing well from a cardiac standpoint. She denies any chest pain or pressure, LE edema, dizziness, palpitations or syncope    Past Medical History  Diagnosis Date  . Mitral valve disorder     Mild to moderate MR by echo 03/2016  . Depression     PTSD  . Hyperlipidemia   . Aortic stenosis 11/2015    Severe AS s/p Bioprosthetic AVR  . Hypothyroidism   . Pulmonary hypertension ([place])     mild with PASP 62mmHg by echo 03/2016  . Glaucoma   . Hypertension   . Complication of anesthesia     awakened durimg procedure- heart cath 11/2015.  woke up during buninectomy  . Shortness of breath dyspnea     noncardiac - most likely secondary to GERD  . Chronic kidney disease (CKD), stage III (moderate)   . Constipation   . GERD (gastroesophageal reflux disease)     Past Surgical History  Procedure Laterality Date  . Cataract extraction Bilateral   . Cardiac catheterization  11/04/2009    normal coronaries  . Carpal tunnel release Right 1982  . Cardiac  catheterization N/A 11/30/2015    Procedure: Right/Left Heart Cath and Coronary Angiography;  Surgeon: Burnell Blanks, MD;  Location: [facility];  Service: Cardiovascular;  Laterality: N/A;  . Colonoscopy    . Trigger finger release Bilateral     thumbs - 2 separtate surgeries  . Bunionectomy Right   . Toe surgery Right     2 nd toe  . Aortic valve replacement N/A 12/16/2015    Procedure: AORTIC VALVE REPLACEMENT (AVR);  Surgeon: Melrose Nakayama, MD;  Location: [facility];  Service: Open Heart Surgery;  Laterality: N/A;  Edwards Intuity Elite Aortic Valve 29mm  . Tee without cardioversion N/A 12/16/2015    Procedure: TRANSESOPHAGEAL ECHOCARDIOGRAM (TEE);  Surgeon: Melrose Nakayama, MD;  Location: [facility];  Service: Open Heart Surgery;  Laterality: N/A;    Current Medications: Outpatient Prescriptions Prior to Visit  Medication Sig Dispense Refill  . aspirin EC 81 MG EC tablet Take 1 tablet (81 mg total) by mouth daily.    . furosemide (LASIX) 20 MG tablet Take 1 tablet (20mg ) by mouth daily as needed for volume overload / shortness of breath 30 tablet 1  . lamoTRIgine (LAMICTAL) 100 MG tablet Take 100-200 mg by mouth 2 (two) times daily. 200mg  in the morning and 100mg  in the evening    .  latanoprost (XALATAN) 0.005 % ophthalmic solution Place 1 drop into both eyes at bedtime.    Marland Kitchen losartan (COZAAR) 50 MG tablet Take 1 tablet (50 mg total) by mouth daily. 30 tablet 2  . metoprolol succinate (TOPROL-XL) 25 MG 24 hr tablet Take 1 tablet (25 mg total) by mouth daily. 30 tablet 11  . traZODone (DESYREL) 100 MG tablet Take 300 mg by mouth at bedtime.     Marland Kitchen azithromycin (ZITHROMAX) 250 MG tablet Take 1 tablet (250 mg total) by mouth daily. Take first 2 tablets together, then 1 every day until finished. (Patient not taking: Reported on 05/10/2016) 6 tablet 0  . guaiFENesin (ROBITUSSIN) 100 MG/5ML liquid Take 5-10 mLs (100-200 mg total) by mouth every 4 (four) hours as needed for  cough. (Patient not taking: Reported on 05/10/2016) 60 mL 0  . levothyroxine (SYNTHROID, LEVOTHROID) 200 MCG tablet Take 150 mcg by mouth daily.     . predniSONE (DELTASONE) 20 MG tablet Take 2 tablets (40 mg total) by mouth daily. (Patient not taking: Reported on 05/10/2016) 10 tablet 0   No facility-administered medications prior to visit.     Allergies:   Risperdal   Social History   Social History  . Marital Status: Single    Spouse Name: N/A  . Number of Children: N/A  . Years of Education: N/A   Social History Main Topics  . Smoking status: Former Smoker -- 12 years    Quit date: 10/29/1976  . Smokeless tobacco: Never Used  . Alcohol Use: Yes     Comment: Occ  . Drug Use: No  . Sexual Activity: Not Asked   Other Topics Concern  . None   Social History Narrative     Family History:  The patient's family history includes Diabetes in her father and mother; Heart disease in her father; Hypertension in her father; Suicidality (age of onset: 38) in her mother.   ROS:   Please see the history of present illness.    ROS All other systems reviewed and are negative.   PHYSICAL EXAM:   VS:  BP 130/72 mmHg  Pulse 67  Ht 5' 4.5" (1.638 m)  Wt 183 lb 12.8 oz (83.371 kg)  BMI 31.07 kg/m2  SpO2 96%   GEN: Well nourished, well developed, in no acute distress HEENT: normal Neck: no JVD, carotid bruits, or masses Cardiac: RRR; no murmurs, rubs, or gallops,no edema.  Intact distal pulses bilaterally.  Respiratory:  clear to auscultation bilaterally, normal work of breathing GI: soft, nontender, nondistended, + BS MS: no deformity or atrophy Skin: warm and dry, no rash Neuro:  Alert and Oriented x 3, Strength and sensation are intact Psych: euthymic mood, full affect  Wt Readings from Last 3 Encounters:  05/10/16 183 lb 12.8 oz (83.371 kg)  04/04/16 181 lb (82.101 kg)  04/01/16 183 lb (83.008 kg)      Studies/Labs Reviewed:   EKG:  EKG is not ordered today.     Recent Labs: 12/11/2015: ALT 23 12/17/2015: Magnesium 2.0 04/01/2016: B Natriuretic Peptide 96.9; Hemoglobin 15.0; Platelets 209 04/11/2016: BUN 14; Creat 0.95*; Potassium 4.6; Sodium 137   Lipid Panel No results found for: CHOL, TRIG, HDL, CHOLHDL, VLDL, LDLCALC, LDLDIRECT  Additional studies/ records that were reviewed today include:  none    ASSESSMENT:    1. Aortic stenosis   2. Mitral valve disorder   3. AR (aortic regurgitation)   4. Essential hypertension   5. DOE (dyspnea on exertion)  6. Gastroesophageal reflux disease without esophagitis   7. Pulmonary HTN ([place])      PLAN:  In order of problems listed above:  1.   Severe AS s/p bioprosthetic AVR - doing well.  Continue ASA. 2.    Mild to moderate MR by echo 03/2016 3.   AR s/p AVR 4.   HTN - Bp well controlled on current meds.  Continue ARB and BB.   5.   DOE ? Etiology - most likely related to GERD.  She will followup with my PA in 4 weeks to make sure that her SOB has resolved.  Her AV bioprosthesis was working well on echo.  Her mean AV gradient is mildly elevated at 1mmHg and she does have a murmur on exam but doubt this is etiology of her SOB.   6.   GERD - She is starting on her PPI today 7.   Pulmonary HTN -  Mild with PASP 21mmHg.     Medication Adjustments/Labs and Tests Ordered: Current medicines are reviewed at length with the patient today.  Concerns regarding medicines are outlined above.  Medication changes, Labs and Tests ordered today are listed in the Patient Instructions below.  There are no Patient Instructions on file for this visit.   Signed, Fransico Him, MD  05/10/2016 9:45 AM    [facility] [address], [place], [place]  [address] Phone: [phone]; Fax: [phone]

## 2016-05-10 NOTE — Patient Instructions (Signed)
Medication Instructions:  Your physician recommends that you continue on your current medications as directed. Please refer to the Current Medication list given to you today.   Labwork: None  Testing/Procedures: None  Follow-Up: Your physician recommends that you schedule a follow-up appointment on June 07, 2016 with Cecilie Kicks, NP.  Your physician wants you to follow-up in: 6 months with Dr. Radford Pax. You will receive a reminder letter in the mail two months in advance. If you don't receive a letter, please call our office to schedule the follow-up appointment.   Any Other Special Instructions Will Be Listed Below (If Applicable).     If you need a refill on your cardiac medications before your next appointment, please call your pharmacy.

## 2016-05-17 DIAGNOSIS — H5213 Myopia, bilateral: Secondary | ICD-10-CM | POA: Diagnosis not present

## 2016-05-17 DIAGNOSIS — H524 Presbyopia: Secondary | ICD-10-CM | POA: Diagnosis not present

## 2016-05-17 DIAGNOSIS — H401132 Primary open-angle glaucoma, bilateral, moderate stage: Secondary | ICD-10-CM | POA: Diagnosis not present

## 2016-05-17 DIAGNOSIS — H04123 Dry eye syndrome of bilateral lacrimal glands: Secondary | ICD-10-CM | POA: Diagnosis not present

## 2016-05-29 DIAGNOSIS — E039 Hypothyroidism, unspecified: Secondary | ICD-10-CM | POA: Diagnosis not present

## 2016-05-30 DIAGNOSIS — M79672 Pain in left foot: Secondary | ICD-10-CM | POA: Diagnosis not present

## 2016-06-01 ENCOUNTER — Other Ambulatory Visit: Payer: Self-pay | Admitting: Cardiology

## 2016-06-06 DIAGNOSIS — R05 Cough: Secondary | ICD-10-CM | POA: Diagnosis not present

## 2016-06-06 DIAGNOSIS — R070 Pain in throat: Secondary | ICD-10-CM | POA: Diagnosis not present

## 2016-06-06 DIAGNOSIS — K219 Gastro-esophageal reflux disease without esophagitis: Secondary | ICD-10-CM | POA: Diagnosis not present

## 2016-06-06 NOTE — Progress Notes (Signed)
Cardiology Office Note   Date:  06/07/2016   ID:  EVIENNE HIBBERT, DOB 07-21-1946, MRN [id number]  PCP:  Kandice Hams, MD  Cardiologist:  Dr. Radford Pax    Chief Complaint  Patient presents with  . Shortness of Breath    NO CP, SOME SOB SINCE NOT TAKING LASIX AND NO SWELLING THAT SHE NOTICES. NO OTHER COMPLAINTS.      History of Present Illness: Maureen Keller is a 70 y.o. female who presents for SOB, aortic stenosis and HTN.    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 She was seen in April due to acute SOB. ER evaluation showed normal EKG, Chest CT neg for PE and normal BNP and CBC. She was treated with steroids, antibiotics and breathing treatments and symptoms improved. She was given a short trial of diuretics due to history of diastolic dysfunction but this did not improve her SOB. Today she is unsure that she even had the lasix filled She saw an ENT and was diagnosed with GERD and was started on a PPI she states the SOB improved except for the last 3 days.  She has gained a pound a day.  Her wt is up 11 lbs in 3 months.  Her SOB has increased over last 3 days.   She denies any chest pain or pressure, LE edema, dizziness, palpitations or syncope.    Past Medical History  Diagnosis Date  . Mitral valve disorder     Mild to moderate MR by echo 03/2016  . Depression     PTSD  . Hyperlipidemia   . Aortic stenosis 11/2015    Severe AS s/p Bioprosthetic AVR  . Hypothyroidism   . Pulmonary hypertension ([place])     mild with PASP 67mmHg by echo 03/2016  . Glaucoma   . Hypertension   . Complication of anesthesia     awakened durimg procedure- heart cath 11/2015.  woke up during buninectomy  . Shortness of breath dyspnea     noncardiac - most likely secondary to GERD  . Chronic kidney disease (CKD), stage III (moderate)    . Constipation   . GERD (gastroesophageal reflux disease)     Past Surgical History  Procedure Laterality Date  . Cataract extraction Bilateral   . Cardiac catheterization  11/04/2009    normal coronaries  . Carpal tunnel release Right 1982  . Cardiac catheterization N/A 11/30/2015    Procedure: Right/Left Heart Cath and Coronary Angiography;  Surgeon: Burnell Blanks, MD;  Location: [facility];  Service: Cardiovascular;  Laterality: N/A;  . Colonoscopy    . Trigger finger release Bilateral     thumbs - 2 separtate surgeries  . Bunionectomy Right   . Toe surgery Right     2 nd toe  . Aortic valve replacement N/A 12/16/2015    Procedure: AORTIC VALVE REPLACEMENT (AVR);  Surgeon: Melrose Nakayama, MD;  Location: [facility];  Service: Open Heart Surgery;  Laterality: N/A;  Edwards Intuity Elite Aortic Valve 10mm  . Tee without cardioversion N/A 12/16/2015    Procedure: TRANSESOPHAGEAL ECHOCARDIOGRAM (TEE);  Surgeon: Melrose Nakayama, MD;  Location: [facility];  Service: Open Heart Surgery;  Laterality: N/A;     Current Outpatient Prescriptions  Medication Sig Dispense Refill  . aspirin EC 81 MG EC tablet Take 1 tablet (81 mg  total) by mouth daily.    Marland Kitchen gabapentin (NEURONTIN) 300 MG capsule Take 300 mg by mouth 3 (three) times daily.    Marland Kitchen lamoTRIgine (LAMICTAL) 100 MG tablet Take 100-200 mg by mouth 2 (two) times daily. 200mg  in the morning and 100mg  in the evening    . latanoprost (XALATAN) 0.005 % ophthalmic solution Place 1 drop into both eyes at bedtime.    Marland Kitchen levothyroxine (SYNTHROID, LEVOTHROID) 150 MCG tablet Take 150 mcg by mouth as directed.     Marland Kitchen losartan (COZAAR) 50 MG tablet Take 1 tablet (50 mg total) by mouth daily. 30 tablet 10  . metoprolol succinate (TOPROL-XL) 25 MG 24 hr tablet Take 1 tablet (25 mg total) by mouth daily. 30 tablet 11  . omeprazole (PRILOSEC) 20 MG capsule Take 20 mg by mouth daily.    . traZODone (DESYREL) 100 MG tablet Take 300 mg by  mouth at bedtime.      No current facility-administered medications for this visit.    Allergies:   Risperdal    Social History:  The patient  reports that she quit smoking about 39 years ago. She has never used smokeless tobacco. She reports that she drinks alcohol. She reports that she does not use illicit drugs.   Family History:  The patient's family history includes Diabetes in her father and mother; Heart disease in her father; Hypertension in her father; Suicidality (age of onset: 28) in her mother.    ROS:  General:no colds or fevers, no weight changes Skin:no rashes or ulcers HEENT:no blurred vision, no congestion CV:see HPI PUL:see HPI GI:no diarrhea constipation or melena, no indigestion GU:no hematuria, no dysuria MS:no joint pain, no claudication Neuro:no syncope, no lightheadedness Endo:no diabetes, + thyroid disease  Wt Readings from Last 3 Encounters:  06/07/16 194 lb 3.2 oz (88.089 kg)  05/10/16 183 lb 12.8 oz (83.371 kg)  04/04/16 181 lb (82.101 kg)     PHYSICAL EXAM: VS:  BP 140/76 mmHg  Pulse 55  Ht 5' 4.5" (1.638 m)  Wt 194 lb 3.2 oz (88.089 kg)  BMI 32.83 kg/m2  SpO2 96% , BMI Body mass index is 32.83 kg/(m^2). General:Pleasant affect, NAD Skin:Warm and dry, brisk capillary refill HEENT:normocephalic, sclera clear, mucus membranes moist Neck:supple, no JVD, no bruits  Heart:S1S2 RRR with 2/6 harsh aortic murmur, no gallup, rub or click Lungs:clear without rales, rhonchi, or wheezes [id number], non tender, + BS, do not palpate liver spleen or masses Ext:no lower ext edema, 2+ pedal pulses, 2+ radial pulses Neuro:alert and oriented X 3, MAE, follows commands, + facial symmetry    EKG:  EKG is NOT ordered today.    Recent Labs: 12/11/2015: ALT 23 12/17/2015: Magnesium 2.0 04/01/2016: B Natriuretic Peptide 96.9; Hemoglobin 15.0; Platelets 209 04/11/2016: BUN 14; Creat 0.95*; Potassium 4.6; Sodium 137    Lipid Panel No results found for: CHOL,  TRIG, HDL, CHOLHDL, VLDL, LDLCALC, LDLDIRECT     Other studies Reviewed: Additional studies/ records that were reviewed today include: Echo: 04/11/16 .Study Conclusions  - Left ventricle: The cavity size was normal. Wall thickness was  normal. Systolic function was normal. The estimated ejection  fraction was in the range of 60% to 65%. Wall motion was normal;  there were no regional wall motion abnormalities. Features are  consistent with a pseudonormal left ventricular filling pattern,  with concomitant abnormal relaxation and increased filling  pressure (grade 2 diastolic dysfunction). - Aortic valve: A bioprosthesis was present and functioning  normally. Mean gradient (S):  19 mm Hg. Peak gradient (S): 35 mm  Hg. - Mitral valve: There was mild to moderate regurgitation directed  centrally. - Left atrium: The atrium was mildly dilated. - Tricuspid valve: There was mild-moderate regurgitation directed  centrally. - Pulmonary arteries: Systolic pressure was mildly increased. PA  peak pressure: 36 mm Hg (S).    ASSESSMENT AND PLAN:   1. Severe AS s/p bioprosthetic AVR - doing well. Continue ASA. 2. Mild to moderate MR by echo 03/2016 3. AR s/p AVR 4. HTN - Bp well controlled on current meds. Continue ARB and BB.  5. DOE ? Etiology - most likely related to GERD. Her AV bioprosthesis was working well on echo. Her mean AV gradient is mildly elevated at 54mmHg and she does have a murmur on exam but doubt this is etiology of her SOB. PA pressure is elevated- ? Adding to SOB.  Today DOE has increased but over all improved.  She never had Lasix filled.  I have asked her to take today and then as needed.  6. GERD - improved on PPI  7. Pulmonary HTN - Mild with PASP 24mmHg.  8.  Wt gain, I have asked her to check with Dr. Buddy Duty about thyroid.  She has not rec'd last labs from him- per pt.    Current medicines are reviewed with the patient today.  The  patient Has no concerns regarding medicines.  The following changes have been made:  See above Labs/ tests ordered today include:see above  Disposition:   FU:  see above  Signed, Cecilie Kicks, NP  06/07/2016 10:07 AM    [facility] [address], [place], [place], [place] Phone: [phone]; Fax: [phone]

## 2016-06-07 ENCOUNTER — Ambulatory Visit (INDEPENDENT_AMBULATORY_CARE_PROVIDER_SITE_OTHER): Payer: Medicare Other | Admitting: Cardiology

## 2016-06-07 ENCOUNTER — Encounter: Payer: Self-pay | Admitting: Cardiology

## 2016-06-07 VITALS — BP 140/76 | HR 55 | Ht 64.5 in | Wt 194.2 lb

## 2016-06-07 DIAGNOSIS — I272 Other secondary pulmonary hypertension: Secondary | ICD-10-CM

## 2016-06-07 DIAGNOSIS — K219 Gastro-esophageal reflux disease without esophagitis: Secondary | ICD-10-CM | POA: Diagnosis not present

## 2016-06-07 DIAGNOSIS — R0609 Other forms of dyspnea: Secondary | ICD-10-CM | POA: Diagnosis not present

## 2016-06-07 DIAGNOSIS — Z952 Presence of prosthetic heart valve: Secondary | ICD-10-CM

## 2016-06-07 DIAGNOSIS — Z954 Presence of other heart-valve replacement: Secondary | ICD-10-CM | POA: Diagnosis not present

## 2016-06-07 DIAGNOSIS — I350 Nonrheumatic aortic (valve) stenosis: Secondary | ICD-10-CM

## 2016-06-07 DIAGNOSIS — I10 Essential (primary) hypertension: Secondary | ICD-10-CM

## 2016-06-07 MED ORDER — FUROSEMIDE 20 MG PO TABS: 20.0000 mg | ORAL_TABLET | Freq: Every day | ORAL | Status: DC | PRN

## 2016-06-07 NOTE — Patient Instructions (Signed)
Medication Instructions:  TAKE Lasix (Furosemide) 20 mg once daily as needed for shortness of breath   Labwork: None Ordered   Testing/Procedures: None Ordered   Follow-Up: Your physician recommends that you schedule a follow-up appointment in: 3 months with Dr. Radford Pax.    If you need a refill on your cardiac medications before your next appointment, please call your pharmacy.   Thank you for choosing [facility]! Christen Bame, RN [phone]

## 2016-06-21 DIAGNOSIS — M65321 Trigger finger, right index finger: Secondary | ICD-10-CM | POA: Diagnosis not present

## 2016-06-21 DIAGNOSIS — G5602 Carpal tunnel syndrome, left upper limb: Secondary | ICD-10-CM | POA: Diagnosis not present

## 2016-06-26 DIAGNOSIS — G5602 Carpal tunnel syndrome, left upper limb: Secondary | ICD-10-CM | POA: Diagnosis not present

## 2016-07-06 DIAGNOSIS — G5602 Carpal tunnel syndrome, left upper limb: Secondary | ICD-10-CM | POA: Diagnosis not present

## 2016-07-11 ENCOUNTER — Other Ambulatory Visit: Payer: Self-pay | Admitting: Cardiology

## 2016-07-17 DIAGNOSIS — E039 Hypothyroidism, unspecified: Secondary | ICD-10-CM | POA: Diagnosis not present

## 2016-07-18 DIAGNOSIS — K219 Gastro-esophageal reflux disease without esophagitis: Secondary | ICD-10-CM | POA: Diagnosis not present

## 2016-07-18 DIAGNOSIS — R05 Cough: Secondary | ICD-10-CM | POA: Diagnosis not present

## 2016-07-18 DIAGNOSIS — R490 Dysphonia: Secondary | ICD-10-CM | POA: Diagnosis not present

## 2016-07-21 DIAGNOSIS — M1711 Unilateral primary osteoarthritis, right knee: Secondary | ICD-10-CM | POA: Diagnosis not present

## 2016-08-08 DIAGNOSIS — H401122 Primary open-angle glaucoma, left eye, moderate stage: Secondary | ICD-10-CM | POA: Diagnosis not present

## 2016-08-08 DIAGNOSIS — H04123 Dry eye syndrome of bilateral lacrimal glands: Secondary | ICD-10-CM | POA: Diagnosis not present

## 2016-08-08 DIAGNOSIS — H401113 Primary open-angle glaucoma, right eye, severe stage: Secondary | ICD-10-CM | POA: Diagnosis not present

## 2016-08-25 DIAGNOSIS — M1711 Unilateral primary osteoarthritis, right knee: Secondary | ICD-10-CM | POA: Diagnosis not present

## 2016-08-29 ENCOUNTER — Other Ambulatory Visit: Payer: Self-pay | Admitting: Obstetrics and Gynecology

## 2016-08-29 DIAGNOSIS — Z1231 Encounter for screening mammogram for malignant neoplasm of breast: Secondary | ICD-10-CM

## 2016-08-30 ENCOUNTER — Telehealth: Payer: Self-pay | Admitting: Neurology

## 2016-08-30 DIAGNOSIS — Z01419 Encounter for gynecological examination (general) (routine) without abnormal findings: Secondary | ICD-10-CM | POA: Diagnosis not present

## 2016-08-30 NOTE — Telephone Encounter (Signed)
Patient made aware it was Dr. Vertell Limber. Given contact information.

## 2016-08-30 NOTE — Telephone Encounter (Signed)
Maureen Keller Feb 18, 2046.  She would like you to let her know who the Neuro surgeon was that Dr. Carles Collet referred her to. She said it has been about 3 Years ago. Her # is [phone]. Thank you

## 2016-08-31 ENCOUNTER — Other Ambulatory Visit: Payer: Self-pay | Admitting: Orthopaedic Surgery

## 2016-09-07 DIAGNOSIS — M1712 Unilateral primary osteoarthritis, left knee: Secondary | ICD-10-CM | POA: Diagnosis not present

## 2016-09-10 ENCOUNTER — Ambulatory Visit (INDEPENDENT_AMBULATORY_CARE_PROVIDER_SITE_OTHER): Payer: Medicare Other | Admitting: Cardiology

## 2016-09-10 ENCOUNTER — Encounter: Payer: Self-pay | Admitting: Cardiology

## 2016-09-10 VITALS — BP 130/74 | HR 61 | Ht 64.5 in | Wt 185.6 lb

## 2016-09-10 DIAGNOSIS — I350 Nonrheumatic aortic (valve) stenosis: Secondary | ICD-10-CM | POA: Diagnosis not present

## 2016-09-10 DIAGNOSIS — I059 Rheumatic mitral valve disease, unspecified: Secondary | ICD-10-CM | POA: Diagnosis not present

## 2016-09-10 DIAGNOSIS — I272 Other secondary pulmonary hypertension: Secondary | ICD-10-CM

## 2016-09-10 DIAGNOSIS — I10 Essential (primary) hypertension: Secondary | ICD-10-CM

## 2016-09-10 DIAGNOSIS — M25639 Stiffness of unspecified wrist, not elsewhere classified: Secondary | ICD-10-CM | POA: Diagnosis not present

## 2016-09-10 NOTE — Progress Notes (Signed)
Cardiology Office Note    Date:  09/10/2016   ID:  Maureen Keller, DOB Nov 12, 1946, MRN [id number]  PCP:  Kandice Hams, MD  Cardiologist:  Fransico Him, MD   Chief Complaint  Patient presents with  . Aortic Stenosis  . Hypertension    History of Present Illness:  Maureen Keller is a 70 y.o. 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   She is doing well from a cardiac standpoint. She denies any chest pain or pressure, LE edema, dizziness, palpitations or syncope    Past Medical History:  Diagnosis Date  . Aortic stenosis 11/2015   Severe AS s/p Bioprosthetic AVR  . Chronic kidney disease (CKD), stage III (moderate)   . Complication of anesthesia    awakened durimg procedure- heart cath 11/2015.  woke up during buninectomy  . Constipation   . Depression    PTSD  . GERD (gastroesophageal reflux disease)   . Glaucoma   . Hyperlipidemia   . Hypertension   . Hypothyroidism   . Mitral valve disorder    Mild to moderate MR by echo 03/2016  . Pulmonary hypertension ([place])    mild with PASP 82mmHg by echo 03/2016  . Shortness of breath dyspnea    noncardiac - most likely secondary to GERD    Past Surgical History:  Procedure Laterality Date  . AORTIC VALVE REPLACEMENT N/A 12/16/2015   Procedure: AORTIC VALVE REPLACEMENT (AVR);  Surgeon: Melrose Nakayama, MD;  Location: [facility];  Service: Open Heart Surgery;  Laterality: N/A;  Edwards Intuity Elite Aortic Valve 67mm  . BUNIONECTOMY Right   . CARDIAC CATHETERIZATION  11/04/2009   normal coronaries  . CARDIAC CATHETERIZATION N/A 11/30/2015   Procedure: Right/Left Heart Cath and Coronary Angiography;  Surgeon: Burnell Blanks, MD;  Location: [facility];  Service: Cardiovascular;  Laterality: N/A;  . CARPAL TUNNEL RELEASE Right 1982  .  CATARACT EXTRACTION Bilateral   . COLONOSCOPY    . TEE WITHOUT CARDIOVERSION N/A 12/16/2015   Procedure: TRANSESOPHAGEAL ECHOCARDIOGRAM (TEE);  Surgeon: Melrose Nakayama, MD;  Location: [facility];  Service: Open Heart Surgery;  Laterality: N/A;  . TOE SURGERY Right    2 nd toe  . TRIGGER FINGER RELEASE Bilateral    thumbs - 2 separtate surgeries    Current Medications: Outpatient Medications Prior to Visit  Medication Sig Dispense Refill  . aspirin EC 81 MG EC tablet Take 1 tablet (81 mg total) by mouth daily.    . furosemide (LASIX) 20 MG tablet Take 1 tablet (20 mg total) by mouth daily as needed. Take daily as needed for shortness of breath 30 tablet 11  . lamoTRIgine (LAMICTAL) 100 MG tablet Take 100-200 mg by mouth 2 (two) times daily. 200mg  in the morning and 100mg  in the evening    . latanoprost (XALATAN) 0.005 % ophthalmic solution Place 1 drop into both eyes at bedtime.    Marland Kitchen levothyroxine (SYNTHROID, LEVOTHROID) 150 MCG tablet Take 150 mcg by mouth as directed.     Marland Kitchen losartan (COZAAR) 50 MG tablet Take 1 tablet (50 mg total) by mouth daily. 30 tablet 10  . metoprolol succinate (TOPROL-XL) 25 MG 24 hr tablet Take 1 tablet (25 mg total) by mouth daily. 90 tablet 3  . omeprazole (PRILOSEC) 20 MG capsule Take 20 mg by  mouth daily.    . traZODone (DESYREL) 100 MG tablet Take 300 mg by mouth at bedtime.     . gabapentin (NEURONTIN) 300 MG capsule Take 300 mg by mouth 3 (three) times daily.     No facility-administered medications prior to visit.      Allergies:   Review of patient's allergies indicates no active allergies.   Social History   Social History  . Marital status: Single    Spouse name: N/A  . Number of children: N/A  . Years of education: N/A   Social History Main Topics  . Smoking status: Former Smoker    Years: 12.00    Quit date: 10/29/1976  . Smokeless tobacco: Never Used  . Alcohol use Yes     Comment: Occ  . Drug use: No  . Sexual activity: Not Asked     Other Topics Concern  . None   Social History Narrative  . None     Family History:  The patient's family history includes Diabetes in her father and mother; Heart disease in her father; Hypertension in her father; Suicidality (age of onset: 66) in her mother.   ROS:   Please see the history of present illness.    ROS All other systems reviewed and are negative.  No flowsheet data found.     PHYSICAL EXAM:   VS:  BP 130/74   Pulse 61   Ht 5' 4.5" (1.638 m)   Wt 185 lb 9.6 oz (84.2 kg)   SpO2 97%   BMI 31.37 kg/m    GEN: Well nourished, well developed, in no acute distress  HEENT: normal  Neck: no JVD, carotid bruits, or masses Cardiac: RRR; no rubs, or gallops,no edema.  Intact distal pulses bilaterally. 2/6 SM at RUSB to LUSB and carotids Respiratory:  clear to auscultation bilaterally, normal work of breathing GI: soft, nontender, nondistended, + BS MS: no deformity or atrophy  Skin: warm and dry, no rash Neuro:  Alert and Oriented x 3, Strength and sensation are intact Psych: euthymic mood, full affect  Wt Readings from Last 3 Encounters:  09/10/16 185 lb 9.6 oz (84.2 kg)  06/07/16 194 lb 3.2 oz (88.1 kg)  05/10/16 183 lb 12.8 oz (83.4 kg)      Studies/Labs Reviewed:   EKG:  EKG is not ordered today.    Recent Labs: 12/11/2015: ALT 23 12/17/2015: Magnesium 2.0 04/01/2016: B Natriuretic Peptide 96.9; Hemoglobin 15.0; Platelets 209 04/11/2016: BUN 14; Creat 0.95; Potassium 4.6; Sodium 137   Lipid Panel No results found for: CHOL, TRIG, HDL, CHOLHDL, VLDL, LDLCALC, LDLDIRECT  Additional studies/ records that were reviewed today include:  none    ASSESSMENT:    1. Aortic stenosis   2. Essential hypertension   3. Mitral valve disorder   4. Pulmonary HTN ([facility])      PLAN:  In order of problems listed above:  1. AS/AR s/p bioprosthetic AVR - doing well. She did have a mild gradient across the bioprosthesis and has a murmur most likely due to  small prosthesis.  Continue ASA. 2. HTN - BP controlled on current meds.  Continue Losartan and BB. 3. Mild to moderate MR by echo 03/2016 - repeat echo 03/2017 4. Pulmonary HTN - mild with PASP 42mmHg by echo 03/2016.    Medication Adjustments/Labs and Tests Ordered: Current medicines are reviewed at length with the patient today.  Concerns regarding medicines are outlined above.  Medication changes, Labs and Tests ordered today are listed  in the Patient Instructions below.  There are no Patient Instructions on file for this visit.   Signed, Fransico Him, MD  09/10/2016 8:28 AM    [facility] [address], [place], [place]  [address] Phone: [phone]; Fax: [phone]

## 2016-09-10 NOTE — Patient Instructions (Signed)
Medication Instructions:  Your physician recommends that you continue on your current medications as directed. Please refer to the Current Medication list given to you today.   Labwork: None  Testing/Procedures: Your physician has requested that you have an echocardiogram in April, 2018. Echocardiography is a painless test that uses sound waves to create images of your heart. It provides your doctor with information about the size and shape of your heart and how well your heart's chambers and valves are working. This procedure takes approximately one hour. There are no restrictions for this procedure.  Follow-Up: Your physician wants you to follow-up in: 1 year with Dr. Radford Pax. You will receive a reminder letter in the mail two months in advance. If you don't receive a letter, please call our office to schedule the follow-up appointment.   Any Other Special Instructions Will Be Listed Below (If Applicable).     If you need a refill on your cardiac medications before your next appointment, please call your pharmacy.

## 2016-09-13 ENCOUNTER — Ambulatory Visit
Admission: RE | Admit: 2016-09-13 | Discharge: 2016-09-13 | Disposition: A | Discharge status: Home / Self Care | Payer: Medicare Other | Source: Ambulatory Visit | Attending: Obstetrics and Gynecology | Admitting: Obstetrics and Gynecology

## 2016-09-13 DIAGNOSIS — Z1231 Encounter for screening mammogram for malignant neoplasm of breast: Secondary | ICD-10-CM | POA: Diagnosis not present

## 2016-09-13 DIAGNOSIS — M25639 Stiffness of unspecified wrist, not elsewhere classified: Secondary | ICD-10-CM | POA: Diagnosis not present

## 2016-09-13 NOTE — Progress Notes (Signed)
Maureen Keller was seen today in the movement disorders clinic for neurologic consultation at the request of Dr. Delfina Redwood.  The consultation is for the evaluation of falls and to r/o PD.  The first symptom(s) the patient noticed was balance problems and this was 2 years ago.  The falls were previously just occasionally, but over the course of the last year, she has fallen 17 times.  The falls can be related to walking up stairs, trying to lean over to pick up her dog, trying to sit on the bed and missing it.  She states that she "doesn't pick up her feet" enough.  She has been in PT for 3 months and she doesn't think that it has helped the falls.  She has noted some tremor in the L hand when she holds the hand up and holds something in it.  She is R hand dominant but it involves the L hand and it has been going on 1-2 years.  She has rarely noted rest tremor.  05/05/14 update:  The patient was seen today in followup.  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  She is now off of Risperdal and she is doing much better.  However, she is still having some falls.  She falls on slopes and uneven surfaces.  She fell 2 weeks ago but prior to that hadn't fallen in 3 months.  She mentions several concerning things.  She states that when she walks, "my feet are in one place and my body lurches."  She also states that she is having difficulty writing.  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  She subsequently saw Dr. Vertell Limber and he recommended having visual fields done and repeat MRI of the brain in 6 months.  I do not have her ophthalmology notes, but she apparently did have visual fields done and she states that those were good.   She had some labs done last visit.  Her copper was normal at 109.  For some reason, the ceruloplasmin was not done.  Her B12 was normal at 618.  She  had an echocardiogram done on 12/08/13 and there was mild to moderate AR and moderate to severe aortic stenosis.  09/14/16 update:  The patient follows up today.  I have reviewed records available to me since last visit.   I have not seen her in 2-1/2 years.  At that point, I scheduled her for a DaT scan.  She ultimately canceled the scan and did not have it done.  She was also supposed to be following up with Dr. Vertell Limber regarding a Rathke's cleft cyst.  Per records that I have, her last MRI of the brain was in 2014.  Per Dr. Donald Pore last records, he recommended that she had a repeat MRI of the brain 6 months after she last saw him, which I believe was in early 2015 or late 2014.  He also recommended visual field testing be done at that time.  Reports that she had been following with Dr. Bing Plume, and had the VF testing done.  I did call Dr. Donald Pore office for his notes and it appears that she did have a repeat scan on 08/26/14 and it was stable.  I don't have those films.  Pt reports today that after she went off of the Risperdal, she felt great  and was completely normal.  "I could finally appreciate the ability to get up stairs."  States that she has continued to have vision issues, and is being referred to a glaucoma specialist (Dr. Jackquline Denmark on 10/5).  States that she had an abnormal VF 2 weeks ago but states that the VF has not been consistent.  She was worried about the cyst causing VF changes.  No diplopia.  Has had several falls but the last 2 were in walking in [place] and she was walking on uneven surface like wood chips.  She is also getting ready to have L TKA at end of October.  No swallowing trouble but was dx with "silent" GERD and is now on prilosec.    PREVIOUS MEDICATIONS: risperdal  ALLERGIES:  No Active Allergies  CURRENT MEDICATIONS:  Current Outpatient Prescriptions on File Prior to Visit  Medication Sig Dispense Refill  . aspirin EC 81 MG EC tablet Take 1 tablet (81 mg total) by mouth  daily.    . furosemide (LASIX) 20 MG tablet Take 1 tablet (20 mg total) by mouth daily as needed. Take daily as needed for shortness of breath 30 tablet 11  . lamoTRIgine (LAMICTAL) 100 MG tablet Take 100-200 mg by mouth 2 (two) times daily. 200mg  in the morning and 100mg  in the evening    . latanoprost (XALATAN) 0.005 % ophthalmic solution Place 1 drop into both eyes at bedtime.    Marland Kitchen levothyroxine (SYNTHROID, LEVOTHROID) 150 MCG tablet Take 150 mcg by mouth as directed.     Marland Kitchen losartan (COZAAR) 50 MG tablet Take 1 tablet (50 mg total) by mouth daily. 30 tablet 10  . metoprolol succinate (TOPROL-XL) 25 MG 24 hr tablet Take 1 tablet (25 mg total) by mouth daily. 90 tablet 3  . omeprazole (PRILOSEC) 20 MG capsule Take 20 mg by mouth daily.    . traZODone (DESYREL) 100 MG tablet Take 300 mg by mouth at bedtime.      No current facility-administered medications on file prior to visit.     PAST MEDICAL HISTORY:   Past Medical History:  Diagnosis Date  . Aortic stenosis 11/2015   Severe AS s/p Bioprosthetic AVR  . Chronic kidney disease (CKD), stage III (moderate)   . Complication of anesthesia    awakened durimg procedure- heart cath 11/2015.  woke up during buninectomy  . Constipation   . Depression    PTSD  . GERD (gastroesophageal reflux disease)   . Glaucoma   . Hyperlipidemia   . Hypertension   . Hypothyroidism   . Mitral valve disorder    Mild to moderate MR by echo 03/2016  . Pulmonary hypertension ([place])    mild with PASP 28mmHg by echo 03/2016  . Shortness of breath dyspnea    noncardiac - most likely secondary to GERD    PAST SURGICAL HISTORY:   Past Surgical History:  Procedure Laterality Date  . AORTIC VALVE REPLACEMENT N/A 12/16/2015   Procedure: AORTIC VALVE REPLACEMENT (AVR);  Surgeon: Melrose Nakayama, MD;  Location: [facility];  Service: Open Heart Surgery;  Laterality: N/A;  Edwards Intuity Elite Aortic Valve 67mm  . BUNIONECTOMY Right   . CARDIAC CATHETERIZATION   11/04/2009   normal coronaries  . CARDIAC CATHETERIZATION N/A 11/30/2015   Procedure: Right/Left Heart Cath and Coronary Angiography;  Surgeon: Burnell Blanks, MD;  Location: [facility];  Service: Cardiovascular;  Laterality: N/A;  . CARPAL TUNNEL RELEASE Right 1982  . CARPAL TUNNEL RELEASE Left   .  CATARACT EXTRACTION Bilateral   . COLONOSCOPY    . TEE WITHOUT CARDIOVERSION N/A 12/16/2015   Procedure: TRANSESOPHAGEAL ECHOCARDIOGRAM (TEE);  Surgeon: Melrose Nakayama, MD;  Location: [facility];  Service: Open Heart Surgery;  Laterality: N/A;  . TOE SURGERY Right    2 nd toe  . TRIGGER FINGER RELEASE Bilateral    thumbs - 2 separtate surgeries    SOCIAL HISTORY:   Social History   Social History  . Marital status: Single    Spouse name: N/A  . Number of children: N/A  . Years of education: N/A   Occupational History  . Not on file.   Social History Main Topics  . Smoking status: Former Smoker    Years: 12.00    Quit date: 10/29/1976  . Smokeless tobacco: Never Used  . Alcohol use Yes     Comment: Occ  . Drug use: No  . Sexual activity: Not on file   Other Topics Concern  . Not on file   Social History Narrative  . No narrative on file    FAMILY HISTORY:   Family Status  Relation Status  . Mother Deceased at age 5   (overdose)  . Father Deceased at age 46  . Sister Alive  . Daughter Alive   adopted  . Son Alive   adopted    ROS:  A complete 10 system review of systems was obtained and was unremarkable apart from what is mentioned above.  PHYSICAL EXAMINATION:    VITALS:   Vitals:   09/14/16 0822  BP: 100/64  Pulse: 68  Weight: 189 lb (85.7 kg)  Height: 5' 3.5" (1.613 m)    GEN:  The patient appears stated age and is in NAD. HEENT:  Normocephalic, atraumatic.  The mucous membranes are moist. The superficial temporal arteries are without ropiness or tenderness. CV:  RRR with 3/6 SEM radiating to the L carotid. Lungs:   CTAB Neck/HEME:  There are no carotid bruits bilaterally.  Neurological examination:  Orientation: The patient is alert and oriented x3. Fund of knowledge is appropriate.  Recent and remote memory are intact.  Attention and concentration are normal.    Able to name objects and repeat phrases. Cranial nerves: There is good facial symmetry.   Pupils are equal round and reactive to light bilaterally. Fundoscopic exam reveals clear margins bilaterally. Extraocular muscles are intact. The visual fields are full to confrontational testing. The speech is fluent and clear. Soft palate rises symmetrically and there is no tongue deviation. Hearing is intact to conversational tone. Sensation: Sensation is intact to light touch throughout. Motor: Strength is 5/5 in the bilateral upper and R lower extremities.  Strength in knee extensors is 4+/5 on the L (getting ready to have knee surgery).    Shoulder shrug is equal and symmetric.  There is no pronator drift.   Movement examination: Tone: There is normal tone in the bilateral upper extremities.  The tone in the lower extremities is normal.  Abnormal movements: No tremor noted in arms.  Coordination:  There is decremation with RAM's, seen primarily on the L with finger taps and toe taps Gait and Station: The patient has no trouble arising OOC.  Good arm swing.  ASSESSMENT/PLAN:  1.  Vision change  -suspect a primary opthalmologic issue and not due to rathke cleft cyst.  We will repeat MRI brain to make sure and try to get VF testing from Dr. Rachael Fee office  -pt not happy with current ophthalmology.  Names given to alternative one.  Encouraged her to keep appt with glaucoma specialist  -If above investigation negative, could consider referral to neuro-ophthalmology.  2.  Secondary parkinsonism  -resolved off of risperdal.  Told her today I saw no evidence of parkinsonism.  3.  Much greater than 50% of this visit was spent in counseling and coordinating  care.  Total face to face time:  25 min

## 2016-09-14 ENCOUNTER — Encounter: Payer: Self-pay | Admitting: Neurology

## 2016-09-14 ENCOUNTER — Ambulatory Visit (INDEPENDENT_AMBULATORY_CARE_PROVIDER_SITE_OTHER): Payer: Medicare Other | Admitting: Neurology

## 2016-09-14 VITALS — BP 100/64 | HR 68 | Ht 63.5 in | Wt 189.0 lb

## 2016-09-14 DIAGNOSIS — H539 Unspecified visual disturbance: Secondary | ICD-10-CM

## 2016-09-14 DIAGNOSIS — E236 Other disorders of pituitary gland: Secondary | ICD-10-CM

## 2016-09-14 NOTE — Patient Instructions (Addendum)
1.  We will get you scheduled with an MRI brain. We have sent a referral to [facility] for your MRI and they will call you directly to schedule your appt. They are located at [place]. If you need to contact them directly please call [phone].  2.  [facility] (Dr. Ellie Lunch, Dr. Prudencio Burly, Dr. Valetta Close)  3.  If you don't find an answer with the glaucoma specialist or the new opthalmologist, then we are happy to refer you to neuro-opthalmology at [facility] (Dr. Sanda Klein)

## 2016-09-17 DIAGNOSIS — M25639 Stiffness of unspecified wrist, not elsewhere classified: Secondary | ICD-10-CM | POA: Diagnosis not present

## 2016-09-18 ENCOUNTER — Other Ambulatory Visit: Payer: Self-pay | Admitting: Obstetrics and Gynecology

## 2016-09-18 DIAGNOSIS — R928 Other abnormal and inconclusive findings on diagnostic imaging of breast: Secondary | ICD-10-CM

## 2016-09-19 DIAGNOSIS — M25639 Stiffness of unspecified wrist, not elsewhere classified: Secondary | ICD-10-CM | POA: Diagnosis not present

## 2016-09-20 DIAGNOSIS — E236 Other disorders of pituitary gland: Secondary | ICD-10-CM | POA: Diagnosis not present

## 2016-09-20 DIAGNOSIS — H401231 Low-tension glaucoma, bilateral, mild stage: Secondary | ICD-10-CM | POA: Diagnosis not present

## 2016-09-21 ENCOUNTER — Ambulatory Visit
Admission: RE | Admit: 2016-09-21 | Discharge: 2016-09-21 | Disposition: A | Discharge status: Home / Self Care | Payer: Medicare Other | Source: Ambulatory Visit | Attending: Obstetrics and Gynecology | Admitting: Obstetrics and Gynecology

## 2016-09-21 DIAGNOSIS — N6312 Unspecified lump in the right breast, upper inner quadrant: Secondary | ICD-10-CM | POA: Diagnosis not present

## 2016-09-21 DIAGNOSIS — R928 Other abnormal and inconclusive findings on diagnostic imaging of breast: Secondary | ICD-10-CM

## 2016-09-25 ENCOUNTER — Telehealth: Payer: Self-pay | Admitting: Neurology

## 2016-09-25 ENCOUNTER — Ambulatory Visit
Admission: RE | Admit: 2016-09-25 | Discharge: 2016-09-25 | Disposition: A | Discharge status: Home / Self Care | Payer: Medicare Other | Source: Ambulatory Visit | Attending: Neurology | Admitting: Neurology

## 2016-09-25 DIAGNOSIS — H539 Unspecified visual disturbance: Secondary | ICD-10-CM

## 2016-09-25 DIAGNOSIS — E236 Other disorders of pituitary gland: Secondary | ICD-10-CM

## 2016-09-25 MED ORDER — GADOBENATE DIMEGLUMINE 529 MG/ML IV SOLN
8.0000 mL | Freq: Once | INTRAVENOUS | Status: AC | PRN
  Administered 2016-09-25: 8 mL via INTRAVENOUS

## 2016-09-25 NOTE — Telephone Encounter (Signed)
-----   Message from [place], DO sent at 09/25/2016 11:54 AM EDT ----- Let pt know that cyst is stable in size and likely not causing visual change.  Did she get into her new eye doc?

## 2016-09-25 NOTE — Telephone Encounter (Signed)
Patient made aware of MR results.  She saw Dr. Arlina Robes and is very happy with her. She requests that we send copy of MRI to her and this has been done. She will call with any questions.

## 2016-09-26 DIAGNOSIS — K219 Gastro-esophageal reflux disease without esophagitis: Secondary | ICD-10-CM | POA: Diagnosis not present

## 2016-09-26 DIAGNOSIS — R070 Pain in throat: Secondary | ICD-10-CM | POA: Diagnosis not present

## 2016-09-27 ENCOUNTER — Other Ambulatory Visit: Payer: Self-pay | Admitting: Orthopaedic Surgery

## 2016-09-28 ENCOUNTER — Other Ambulatory Visit: Payer: Self-pay | Admitting: Orthopaedic Surgery

## 2016-10-01 ENCOUNTER — Encounter (HOSPITAL_COMMUNITY)
Admission: RE | Admit: 2016-10-01 | Discharge: 2016-10-01 | Disposition: A | Discharge status: Home / Self Care | Payer: Medicare Other | Source: Ambulatory Visit | Attending: Orthopaedic Surgery | Admitting: Orthopaedic Surgery

## 2016-10-01 ENCOUNTER — Other Ambulatory Visit: Payer: Self-pay | Admitting: Orthopaedic Surgery

## 2016-10-01 ENCOUNTER — Telehealth: Payer: Self-pay | Admitting: Cardiology

## 2016-10-01 ENCOUNTER — Encounter (HOSPITAL_COMMUNITY): Payer: Self-pay

## 2016-10-01 DIAGNOSIS — Z79899 Other long term (current) drug therapy: Secondary | ICD-10-CM | POA: Diagnosis not present

## 2016-10-01 DIAGNOSIS — Z87891 Personal history of nicotine dependence: Secondary | ICD-10-CM | POA: Diagnosis not present

## 2016-10-01 DIAGNOSIS — F4310 Post-traumatic stress disorder, unspecified: Secondary | ICD-10-CM | POA: Diagnosis not present

## 2016-10-01 DIAGNOSIS — E039 Hypothyroidism, unspecified: Secondary | ICD-10-CM | POA: Insufficient documentation

## 2016-10-01 DIAGNOSIS — Z7982 Long term (current) use of aspirin: Secondary | ICD-10-CM | POA: Insufficient documentation

## 2016-10-01 DIAGNOSIS — K219 Gastro-esophageal reflux disease without esophagitis: Secondary | ICD-10-CM | POA: Diagnosis not present

## 2016-10-01 DIAGNOSIS — I130 Hypertensive heart and chronic kidney disease with heart failure and stage 1 through stage 4 chronic kidney disease, or unspecified chronic kidney disease: Secondary | ICD-10-CM | POA: Diagnosis not present

## 2016-10-01 DIAGNOSIS — E785 Hyperlipidemia, unspecified: Secondary | ICD-10-CM | POA: Diagnosis not present

## 2016-10-01 DIAGNOSIS — N183 Chronic kidney disease, stage 3 (moderate): Secondary | ICD-10-CM | POA: Insufficient documentation

## 2016-10-01 DIAGNOSIS — Z01812 Encounter for preprocedural laboratory examination: Secondary | ICD-10-CM | POA: Insufficient documentation

## 2016-10-01 HISTORY — DX: Unspecified osteoarthritis, unspecified site: M19.90

## 2016-10-01 LAB — APTT: APTT: 26 s (ref 24–36)

## 2016-10-01 LAB — CBC WITH DIFFERENTIAL/PLATELET
BASOS ABS: 0 10*3/uL (ref 0.0–0.1)
BASOS PCT: 1 %
EOS ABS: 0.2 10*3/uL (ref 0.0–0.7)
EOS PCT: 4 %
HCT: 44.8 % (ref 36.0–46.0)
Hemoglobin: 15.3 g/dL — ABNORMAL HIGH (ref 12.0–15.0)
Lymphocytes Relative: 44 %
Lymphs Abs: 2.2 10*3/uL (ref 0.7–4.0)
MCH: 31.4 pg (ref 26.0–34.0)
MCHC: 34.2 g/dL (ref 30.0–36.0)
MCV: 91.8 fL (ref 78.0–100.0)
MONO ABS: 0.4 10*3/uL (ref 0.1–1.0)
MONOS PCT: 9 %
NEUTROS ABS: 2 10*3/uL (ref 1.7–7.7)
Neutrophils Relative %: 42 %
PLATELETS: 220 10*3/uL (ref 150–400)
RBC: 4.88 MIL/uL (ref 3.87–5.11)
RDW: 14.2 % (ref 11.5–15.5)
WBC: 4.9 10*3/uL (ref 4.0–10.5)

## 2016-10-01 LAB — URINALYSIS, ROUTINE W REFLEX MICROSCOPIC
Bilirubin Urine: NEGATIVE
GLUCOSE, UA: NEGATIVE mg/dL
HGB URINE DIPSTICK: NEGATIVE
Ketones, ur: NEGATIVE mg/dL
LEUKOCYTES UA: NEGATIVE
Nitrite: NEGATIVE
PROTEIN: NEGATIVE mg/dL
SPECIFIC GRAVITY, URINE: 1.01 (ref 1.005–1.030)
pH: 6 (ref 5.0–8.0)

## 2016-10-01 LAB — BASIC METABOLIC PANEL
ANION GAP: 6 (ref 5–15)
BUN: 9 mg/dL (ref 6–20)
CALCIUM: 9.8 mg/dL (ref 8.9–10.3)
CO2: 28 mmol/L (ref 22–32)
CREATININE: 0.93 mg/dL (ref 0.44–1.00)
Chloride: 106 mmol/L (ref 101–111)
GFR calc Af Amer: >60 mL/min (ref >60)
GLUCOSE: 114 mg/dL — AB (ref 65–99)
Potassium: 3.9 mmol/L (ref 3.5–5.1)
Sodium: 140 mmol/L (ref 135–145)

## 2016-10-01 LAB — PROTIME-INR
INR: 0.92
Prothrombin Time: 12.3 seconds (ref 11.4–15.2)

## 2016-10-01 LAB — SURGICAL PCR SCREEN
MRSA, PCR: NEGATIVE
STAPHYLOCOCCUS AUREUS: NEGATIVE

## 2016-10-01 NOTE — Progress Notes (Signed)
Anesthesia chart review: Patient is a 70 year old female posted for right TKA on 10/09/16 by Dr. Rhona Raider. Consent to be signed on the day of surgery to clarify laterality.  History includes severe AS s/p AVR (19 mm Edwards Intuity/Elite AV) 12/16/15, mild to moderate MR 03/2016 echo, mild pulmonary hypertension 03/2016 echo, HTN, former smoker, depression, PTSD, HLD, hypothyroidism, glaucoma, GERD, SOB, CKD stage III, arthritis. Reports she awaken during "anesthesia" for cardiac cath and bunionectomy which where likely not done under [facility]. 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       PCP is Dr. Delfina Redwood. Cardiologist is Dr. Fransico Him, last visit 09/14/16. Dr. Radford Pax did sign a note stating "patient is low risk from cardiac standpoint for above surgery."   CT surgeon is Dr. Roxan Hockey. Neurologist is Dr. Carles Collet.  BP (!) 150/57   Pulse 60   Temp 36.4 C   Resp 18   Ht 5' 3.5" (1.613 m)   Wt 191 lb 3.2 oz (86.7 kg)   SpO2 99%   BMI 33.34 kg/m   Meds include ASA 81 mg, Lasix as needed, Lamictal, Xalatan ophthalmic, levothyroxine, losartan, Toprol XL, Prilosec, trazodone.  04/01/16 EKG: NSR.  04/11/16 Echo: Study Conclusions - Left ventricle: The cavity size was normal. Wall thickness was   normal. Systolic function was normal. The estimated ejection   fraction was in the range of 60% to 65%. Wall motion was normal;   there were no regional wall motion abnormalities. Features are   consistent with a pseudonormal left ventricular filling pattern,   with concomitant abnormal relaxation and increased filling   pressure (grade 2 diastolic dysfunction). - Aortic valve: A bioprosthesis was present and functioning   normally. Mean gradient (S): 19 mm Hg. Peak gradient (S): 35 mm   Hg. - Mitral valve: There was mild to moderate regurgitation directed   centrally. - Left atrium: The atrium was mildly dilated. -  Tricuspid valve: There was mild-moderate regurgitation directed   centrally. - Pulmonary arteries: Systolic pressure was mildly increased. PA   peak pressure: 36 mm Hg (S).  11/30/15 Cardiac cath (PRE-AVR): 1. No angiographic evidence of CAD 2. Severe aortic valve stenosis by echo (Cath data with peak to peak gradient 14 mm Hg, mean gradient 13.7 mm Hg, AVA 1.03 cm2).  3. Mild elevation filling pressures.  Recommendations: Will discuss with Dr. Radford Pax. She appears to have severe AS with moderate AI by echo. She is relatively young and very active. She appears to be a good candidate for AVR by conventional approach. I will place a consult to our CT surgery division to discuss AVR.   12/15/15 Carotid U/S: Summary: - Bilateral - 1% to 39% ICA stenosis. Vertebral artery flow is   antegrade.  04/01/16 CXR: IMPRESSION: 1. No active cardiopulmonary disease. 2. Sequela of prior valve replacement.  12/15/15 PFTs: FVC 3.22 (107%), FEV1 2.62 (115%), DLCO unc 16.70 (68%).  09/25/16 MRI Brain: IMPRESSION: - Findings consistent with interval stability of a Rathke's cleft cyst. Mild mass effect on the chiasm is stable from 2014 and 2015. - Atrophy and small vessel disease. Similar appearance to priors. No Acute.  Preoperative labs noted. Cr 0.93, H/H 15.3/44.8. UA WNL. PT/PTT WNL. Glucose 114.   If no acute changes then I would anticipate that she can proceed as planned.  [facility] Stay [facility]/Anesthesiology Phone [phone] 10/01/2016 4:03 PM

## 2016-10-01 NOTE — Progress Notes (Signed)
DR Dalldorf's notified of consent change. Also pt is getting in touch with Dr Radford Pax for clearance

## 2016-10-01 NOTE — Telephone Encounter (Signed)
Informed patient that clearance was faxed, but will fax again.  She was grateful for call.

## 2016-10-01 NOTE — Telephone Encounter (Signed)
Mrs.Fauteux is scheduled Oct 24th for knee replacement needs approval put into the system

## 2016-10-01 NOTE — Pre-Procedure Instructions (Signed)
    Maureen Keller  10/01/2016      [address], [place] [address] Phone: [phone] Fax: [phone]    Your procedure is scheduled on 10/09/16.  Report to [facility] Admitting at 530 A.M.  Call this number if you have problems the morning of surgery:  [phone]   Remember:  Do not eat food or drink liquids after midnight.  Take these medicines the morning of surgery with A SIP OF WATER --eye drops,synthroid,metoprolol,prilosec   Do not wear jewelry, make-up or nail polish.  Do not wear lotions, powders, or perfumes, or deoderant.  Do not shave 48 hours prior to surgery.  Men may shave face and neck.  Do not bring valuables to the hospital.  [facility] is not responsible for any belongings or valuables.  Contacts, dentures or bridgework may not be worn into surgery.  Leave your suitcase in the car.  After surgery it may be brought to your room.  For patients admitted to the hospital, discharge time will be determined by your treatment team.  Patients discharged the day of surgery will not be allowed to drive home.   Name and phone number of your driver:   Special instructions:  Do not take any aspirin,anti-inflammatories,vitamins,or herbal supplements 5-7 days prior to surgery.  Please read over the following fact sheets that you were given. MRSA Information

## 2016-10-02 LAB — TYPE AND SCREEN
ABO/RH(D): AB POS
Antibody Screen: NEGATIVE

## 2016-10-08 NOTE — Anesthesia Preprocedure Evaluation (Addendum)
Anesthesia Evaluation  Patient identified by MRN, date of birth, ID band Patient awake    Reviewed: Allergy & Precautions, NPO status , Patient's Chart, lab work & pertinent test results, reviewed documented beta blocker date and time   History of Anesthesia Complications (+) history of anesthetic complications ("woke up during anesthesia")  Airway Mallampati: I  TM Distance: >3 FB Neck ROM: Full    Dental  (+) Teeth Intact, Dental Advisory Given, Caps, Missing, Implants,    Pulmonary former smoker,  PHTN   Pulmonary exam normal breath sounds clear to auscultation       Cardiovascular hypertension, Pt. on home beta blockers and Pt. on medications (-) angina(-) Past MI and (-) CHF + Valvular Problems/Murmurs MR  Rhythm:Regular Rate:Normal + Systolic murmurs severe AS s/p AVR (19 mm Edwards Intuity/Elite AV) 12/16/15, mild to moderate MR 03/2016 echo, mild pulmonary hypertension 03/2016 echo  04/11/16 Echo: Study Conclusions - Left ventricle: The cavity size was normal. Wall thickness was normal. Systolic function was normal. The estimated ejection fraction was in the range of 60% to 65%. Wall motion was normal; there were no regional wall motion abnormalities. Features are consistent with a pseudonormal left ventricular filling pattern, with concomitant abnormal relaxation and increased filling pressure (grade 2 diastolic dysfunction). - Aortic valve: A bioprosthesis was present and functioning normally. Mean gradient (S): 19 mm Hg. Peak gradient (S): 35 mm Hg. - Mitral valve: There was mild to moderate regurgitation directed centrally. - Left atrium: The atrium was mildly dilated. - Tricuspid valve: There was mild-moderate regurgitation directed centrally. - Pulmonary arteries: Systolic pressure was mildly increased. PA peak pressure: 36 mm Hg (S).   Neuro/Psych PSYCHIATRIC DISORDERS Depression negative  neurological ROS     GI/Hepatic Neg liver ROS, GERD  Medicated and Controlled,  Endo/Other  Hypothyroidism Obesity   Renal/GU Renal InsufficiencyRenal disease     Musculoskeletal  (+) Arthritis , Osteoarthritis,    Abdominal   Peds  Hematology negative hematology ROS (+) Plt 220k   Anesthesia Other Findings Day of surgery medications reviewed with the patient.  Pt describes three incidences of awareness during surgeries in past;  All situations sound like sedation and not gen anesth.  Reproductive/Obstetrics                           Anesthesia Physical Anesthesia Plan  ASA: III  Anesthesia Plan: Spinal and Regional   Post-op Pain Management:  Regional for Post-op pain   Induction:   Airway Management Planned:   Additional Equipment:   Intra-op Plan:   Post-operative Plan:   Informed Consent: I have reviewed the patients History and Physical, chart, labs and discussed the procedure including the risks, benefits and alternatives for the proposed anesthesia with the patient or authorized representative who has indicated his/her understanding and acceptance.   Dental advisory given  Plan Discussed with: CRNA, Anesthesiologist and Surgeon  Anesthesia Plan Comments: (Discussed risks and benefits of and differences between spinal and general. Discussed risks of spinal including headache, backache, failure, bleeding, infection, and nerve damage. Patient consents to spinal. Questions answered. Coagulation studies and platelet count acceptable.  Discussed risks and benefits of adductor canal block including failure, bleeding, infection, nerve damage, weakness. Discussed that the block may not prevent all of the pain in the knee. Questions answered. Patient consents to block. )        Anesthesia Quick Evaluation

## 2016-10-08 NOTE — H&P (Signed)
TOTAL KNEE ADMISSION H&P  Patient is being admitted for right total knee arthroplasty.  Subjective:  Chief Complaint:right knee pain.  HPI: Maureen Keller, 70 y.o. female, has a history of pain and functional disability in the right knee due to arthritis and has failed non-surgical conservative treatments for greater than 12 weeks to includeNSAID's and/or analgesics, corticosteriod injections, flexibility and strengthening excercises, use of assistive devices, weight reduction as appropriate and activity modification.  Onset of symptoms was gradual, starting 5 years ago with gradually worsening course since that time. The patient noted no past surgery on the right knee(s).  Patient currently rates pain in the right knee(s) at 10 out of 10 with activity. Patient has night pain, worsening of pain with activity and weight bearing, pain that interferes with activities of daily living, crepitus and joint swelling.  Patient has evidence of subchondral cysts, subchondral sclerosis, periarticular osteophytes and joint space narrowing by imaging studies. There is no active infection.  Patient Active Problem List   Diagnosis Date Noted  . GERD (gastroesophageal reflux disease) 05/10/2016  . Pulmonary HTN 05/10/2016  . Encounter for therapeutic drug monitoring 12/23/2015  . S/P AVR (aortic valve replacement) 12/16/2015  . DOE (dyspnea on exertion) 11/07/2015  . Chest pain 05/12/2015  . 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 Aortic stenosis   . Mitral valve disorder   . AR (aortic regurgitation)   . Hypertension    Past Medical History:  Diagnosis Date  . Aortic stenosis 11/2015   Severe AS s/p Bioprosthetic AVR  . Arthritis   . Chronic kidney disease (CKD), stage III (moderate)   . Complication of anesthesia    awakened durimg procedure- heart cath 11/2015.  woke up during buninectomy  . Constipation   . Depression    PTSD  . GERD (gastroesophageal reflux disease)   . Glaucoma   .  Hyperlipidemia   . Hypertension   . Hypothyroidism   . Mitral valve disorder    Mild to moderate MR by echo 03/2016  . Pulmonary hypertension    mild with PASP 74mmHg by echo 03/2016  . Shortness of breath dyspnea    noncardiac - most likely secondary to GERD    Past Surgical History:  Procedure Laterality Date  . AORTIC VALVE REPLACEMENT N/A 12/16/2015   Procedure: AORTIC VALVE REPLACEMENT (AVR);  Surgeon: Melrose Nakayama, MD;  Location: [facility];  Service: Open Heart Surgery;  Laterality: N/A;  Edwards Intuity Elite Aortic Valve 49mm  . BUNIONECTOMY Right   . CARDIAC CATHETERIZATION  11/04/2009   normal coronaries  . CARDIAC CATHETERIZATION N/A 11/30/2015   Procedure: Right/Left Heart Cath and Coronary Angiography;  Surgeon: Burnell Blanks, MD;  Location: [facility];  Service: Cardiovascular;  Laterality: N/A;  . CARPAL TUNNEL RELEASE Right 1982  . CARPAL TUNNEL RELEASE Left   . CATARACT EXTRACTION Bilateral   . COLONOSCOPY    . CORONARY ARTERY BYPASS GRAFT    . TEE WITHOUT CARDIOVERSION N/A 12/16/2015   Procedure: TRANSESOPHAGEAL ECHOCARDIOGRAM (TEE);  Surgeon: Melrose Nakayama, MD;  Location: [facility];  Service: Open Heart Surgery;  Laterality: N/A;  . TOE SURGERY Right    2 nd toe  . TRIGGER FINGER RELEASE Bilateral    thumbs - 2 separtate surgeries    No prescriptions prior to admission.   No Known Allergies  Social History  Substance Use Topics  . Smoking status: Former Smoker    Years: 12.00    Quit date: 10/29/1976  .  Smokeless tobacco: Never Used  . Alcohol use Yes     Comment: Occ    Family History  Problem Relation Age of Onset  . Diabetes Mother   . Suicidality Mother 61  . Hypertension Father   . Heart disease Father   . Diabetes Father      Review of Systems  Musculoskeletal: Positive for joint pain.       Right knee  All other systems reviewed and are negative.   Objective:  Physical Exam  Constitutional: She is oriented to  person, place, and time. She appears well-developed and well-nourished.  HENT:  Head: Normocephalic.  Eyes: Pupils are equal, round, and reactive to light.  Neck: Normal range of motion.  Cardiovascular: Normal rate and regular rhythm.   Respiratory: Effort normal.  GI: Soft.  Musculoskeletal:  Right knee motion remains 0-100 or 110. She has pain at the extreme of flexion. Opposite knee flexes more. She has lateral and medial joint line pain with some crepitation but no effusion. Hip motion is full and straight leg raise is negative. Sensation and motor function are intact in her feet with palpable pulses on both sides. There is no palpable lymphadenopathy behind her knee.     Neurological: She is alert and oriented to person, place, and time.  Skin: Skin is warm and dry.  Psychiatric: She has a normal mood and affect. Her behavior is normal. Judgment and thought content normal.    Vital signs in last 24 hours:    Labs:   Estimated body mass index is 33.34 kg/m as calculated from the following:   Height as of 10/01/16: 5' 3.5" (1.613 m).   Weight as of 10/01/16: 86.7 kg (191 lb 3.2 oz).   Imaging Review Plain radiographs demonstrate severe degenerative joint disease of the right knee(s). The overall alignment isneutral. The bone quality appears to be good for age and reported activity level.  Assessment/Plan:  End stage primary arthritis, right knee   The patient history, physical examination, clinical judgment of the provider and imaging studies are consistent with end stage degenerative joint disease of the right knee(s) and total knee arthroplasty is deemed medically necessary. The treatment options including medical management, injection therapy arthroscopy and arthroplasty were discussed at length. The risks and benefits of total knee arthroplasty were presented and reviewed. The risks due to aseptic loosening, infection, stiffness, patella tracking problems, thromboembolic  complications and other imponderables were discussed. The patient acknowledged the explanation, agreed to proceed with the plan and consent was signed. Patient is being admitted for inpatient treatment for surgery, pain control, PT, OT, prophylactic antibiotics, VTE prophylaxis, progressive ambulation and ADL's and discharge planning. The patient is planning to be discharged to skilled nursing facility

## 2016-10-09 ENCOUNTER — Inpatient Hospital Stay (HOSPITAL_COMMUNITY)
Admission: RE | Admit: 2016-10-09 | Discharge: 2016-10-11 | DRG: 470 | Disposition: A | Discharge status: Home Health | Payer: Medicare Other | Source: Ambulatory Visit | Attending: Orthopaedic Surgery | Admitting: Orthopaedic Surgery

## 2016-10-09 ENCOUNTER — Inpatient Hospital Stay (HOSPITAL_COMMUNITY): Payer: Medicare Other | Admitting: Anesthesiology

## 2016-10-09 ENCOUNTER — Encounter (HOSPITAL_COMMUNITY): Payer: Self-pay | Admitting: *Deleted

## 2016-10-09 ENCOUNTER — Inpatient Hospital Stay (HOSPITAL_COMMUNITY): Payer: Medicare Other | Admitting: Vascular Surgery

## 2016-10-09 ENCOUNTER — Encounter (HOSPITAL_COMMUNITY)
Admission: RE | Disposition: A | Discharge status: Home Health | Payer: Self-pay | Source: Ambulatory Visit | Attending: Orthopaedic Surgery

## 2016-10-09 DIAGNOSIS — M25761 Osteophyte, right knee: Secondary | ICD-10-CM | POA: Diagnosis present

## 2016-10-09 DIAGNOSIS — R011 Cardiac murmur, unspecified: Secondary | ICD-10-CM | POA: Diagnosis present

## 2016-10-09 DIAGNOSIS — Z9842 Cataract extraction status, left eye: Secondary | ICD-10-CM | POA: Diagnosis not present

## 2016-10-09 DIAGNOSIS — Z6833 Body mass index (BMI) 33.0-33.9, adult: Secondary | ICD-10-CM | POA: Diagnosis not present

## 2016-10-09 DIAGNOSIS — E039 Hypothyroidism, unspecified: Secondary | ICD-10-CM | POA: Diagnosis present

## 2016-10-09 DIAGNOSIS — Z87891 Personal history of nicotine dependence: Secondary | ICD-10-CM | POA: Diagnosis not present

## 2016-10-09 DIAGNOSIS — Z8249 Family history of ischemic heart disease and other diseases of the circulatory system: Secondary | ICD-10-CM | POA: Diagnosis not present

## 2016-10-09 DIAGNOSIS — Z96651 Presence of right artificial knee joint: Secondary | ICD-10-CM

## 2016-10-09 DIAGNOSIS — F4310 Post-traumatic stress disorder, unspecified: Secondary | ICD-10-CM | POA: Diagnosis present

## 2016-10-09 DIAGNOSIS — E669 Obesity, unspecified: Secondary | ICD-10-CM | POA: Diagnosis present

## 2016-10-09 DIAGNOSIS — K219 Gastro-esophageal reflux disease without esophagitis: Secondary | ICD-10-CM | POA: Diagnosis present

## 2016-10-09 DIAGNOSIS — M1711 Unilateral primary osteoarthritis, right knee: Secondary | ICD-10-CM | POA: Diagnosis not present

## 2016-10-09 DIAGNOSIS — G8918 Other acute postprocedural pain: Secondary | ICD-10-CM | POA: Diagnosis not present

## 2016-10-09 DIAGNOSIS — Z9841 Cataract extraction status, right eye: Secondary | ICD-10-CM | POA: Diagnosis not present

## 2016-10-09 DIAGNOSIS — Z23 Encounter for immunization: Secondary | ICD-10-CM | POA: Diagnosis not present

## 2016-10-09 DIAGNOSIS — Q251 Coarctation of aorta: Secondary | ICD-10-CM | POA: Diagnosis not present

## 2016-10-09 DIAGNOSIS — Z833 Family history of diabetes mellitus: Secondary | ICD-10-CM | POA: Diagnosis not present

## 2016-10-09 DIAGNOSIS — I129 Hypertensive chronic kidney disease with stage 1 through stage 4 chronic kidney disease, or unspecified chronic kidney disease: Secondary | ICD-10-CM | POA: Diagnosis present

## 2016-10-09 DIAGNOSIS — E785 Hyperlipidemia, unspecified: Secondary | ICD-10-CM | POA: Diagnosis present

## 2016-10-09 DIAGNOSIS — I340 Nonrheumatic mitral (valve) insufficiency: Secondary | ICD-10-CM | POA: Diagnosis present

## 2016-10-09 DIAGNOSIS — Z951 Presence of aortocoronary bypass graft: Secondary | ICD-10-CM | POA: Diagnosis not present

## 2016-10-09 DIAGNOSIS — Z953 Presence of xenogenic heart valve: Secondary | ICD-10-CM | POA: Diagnosis not present

## 2016-10-09 DIAGNOSIS — F329 Major depressive disorder, single episode, unspecified: Secondary | ICD-10-CM | POA: Diagnosis present

## 2016-10-09 DIAGNOSIS — M179 Osteoarthritis of knee, unspecified: Secondary | ICD-10-CM | POA: Diagnosis not present

## 2016-10-09 DIAGNOSIS — M25561 Pain in right knee: Secondary | ICD-10-CM | POA: Diagnosis not present

## 2016-10-09 DIAGNOSIS — I272 Pulmonary hypertension, unspecified: Secondary | ICD-10-CM | POA: Diagnosis present

## 2016-10-09 DIAGNOSIS — Z79899 Other long term (current) drug therapy: Secondary | ICD-10-CM

## 2016-10-09 DIAGNOSIS — N183 Chronic kidney disease, stage 3 (moderate): Secondary | ICD-10-CM | POA: Diagnosis present

## 2016-10-09 HISTORY — PX: TOTAL KNEE ARTHROPLASTY: SHX125

## 2016-10-09 SURGERY — ARTHROPLASTY, KNEE, TOTAL
Anesthesia: Regional | Site: Knee | Laterality: Right

## 2016-10-09 MED ORDER — LEVOTHYROXINE SODIUM 25 MCG PO TABS
137.0000 ug | ORAL_TABLET | Freq: Every day | ORAL | Status: DC
  Administered 2016-10-10 – 2016-10-11 (×2): 137 ug via ORAL
  Filled 2016-10-09 (×2): qty 1

## 2016-10-09 MED ORDER — PANTOPRAZOLE SODIUM 40 MG PO TBEC
40.0000 mg | DELAYED_RELEASE_TABLET | Freq: Every day | ORAL | Status: DC
  Administered 2016-10-10 – 2016-10-11 (×2): 40 mg via ORAL
  Filled 2016-10-09 (×2): qty 1

## 2016-10-09 MED ORDER — MIDAZOLAM HCL 5 MG/5ML IJ SOLN
INTRAMUSCULAR | Status: DC | PRN
  Administered 2016-10-09 (×2): 1 mg via INTRAVENOUS

## 2016-10-09 MED ORDER — FENTANYL CITRATE (PF) 100 MCG/2ML IJ SOLN
INTRAMUSCULAR | Status: DC | PRN
  Administered 2016-10-09: 50 ug via INTRAVENOUS

## 2016-10-09 MED ORDER — BUPIVACAINE LIPOSOME 1.3 % IJ SUSP: INTRAMUSCULAR | Status: DC | PRN

## 2016-10-09 MED ORDER — FENTANYL CITRATE (PF) 100 MCG/2ML IJ SOLN: 25.0000 ug | INTRAMUSCULAR | Status: DC | PRN

## 2016-10-09 MED ORDER — HYDROMORPHONE HCL 2 MG/ML IJ SOLN
0.5000 mg | INTRAMUSCULAR | Status: DC | PRN
  Administered 2016-10-09 – 2016-10-10 (×6): 1 mg via INTRAVENOUS
  Filled 2016-10-09 (×6): qty 1

## 2016-10-09 MED ORDER — METHOCARBAMOL 500 MG PO TABS
500.0000 mg | ORAL_TABLET | Freq: Four times a day (QID) | ORAL | Status: DC | PRN
  Administered 2016-10-09 – 2016-10-11 (×4): 500 mg via ORAL
  Filled 2016-10-09 (×4): qty 1

## 2016-10-09 MED ORDER — ONDANSETRON HCL 4 MG/2ML IJ SOLN
INTRAMUSCULAR | Status: AC
  Filled 2016-10-09: qty 2

## 2016-10-09 MED ORDER — METOCLOPRAMIDE HCL 5 MG/ML IJ SOLN: 5.0000 mg | Freq: Three times a day (TID) | INTRAMUSCULAR | Status: DC | PRN

## 2016-10-09 MED ORDER — ASPIRIN EC 325 MG PO TBEC
325.0000 mg | DELAYED_RELEASE_TABLET | Freq: Two times a day (BID) | ORAL | Status: DC
  Administered 2016-10-10 – 2016-10-11 (×3): 325 mg via ORAL
  Filled 2016-10-09 (×3): qty 1

## 2016-10-09 MED ORDER — ROPIVACAINE HCL 7.5 MG/ML IJ SOLN
INTRAMUSCULAR | Status: DC | PRN
  Administered 2016-10-09: 20 mL via PERINEURAL

## 2016-10-09 MED ORDER — DOCUSATE SODIUM 100 MG PO CAPS: 200.0000 mg | ORAL_CAPSULE | Freq: Every day | ORAL | Status: DC

## 2016-10-09 MED ORDER — CHLORHEXIDINE GLUCONATE 4 % EX LIQD: 60.0000 mL | Freq: Once | CUTANEOUS | Status: DC

## 2016-10-09 MED ORDER — LAMOTRIGINE 100 MG PO TABS
100.0000 mg | ORAL_TABLET | Freq: Every day | ORAL | Status: DC
  Administered 2016-10-09 – 2016-10-10 (×2): 100 mg via ORAL
  Filled 2016-10-09 (×2): qty 1

## 2016-10-09 MED ORDER — MENTHOL 3 MG MT LOZG: 1.0000 | LOZENGE | OROMUCOSAL | Status: DC | PRN

## 2016-10-09 MED ORDER — FENTANYL CITRATE (PF) 100 MCG/2ML IJ SOLN
INTRAMUSCULAR | Status: AC
  Filled 2016-10-09: qty 2

## 2016-10-09 MED ORDER — SODIUM CHLORIDE 0.9 % IR SOLN
Status: DC | PRN
  Administered 2016-10-09: 3000 mL

## 2016-10-09 MED ORDER — TRAZODONE HCL 100 MG PO TABS
300.0000 mg | ORAL_TABLET | Freq: Every day | ORAL | Status: DC
  Administered 2016-10-09 – 2016-10-10 (×2): 300 mg via ORAL
  Filled 2016-10-09 (×2): qty 3

## 2016-10-09 MED ORDER — SODIUM CHLORIDE 0.9 % IJ SOLN
INTRAMUSCULAR | Status: DC | PRN
  Administered 2016-10-09: 40 mL

## 2016-10-09 MED ORDER — TRANEXAMIC ACID 1000 MG/10ML IV SOLN
2000.0000 mg | INTRAVENOUS | Status: DC
  Filled 2016-10-09: qty 20

## 2016-10-09 MED ORDER — FUROSEMIDE 20 MG PO TABS: 20.0000 mg | ORAL_TABLET | Freq: Every day | ORAL | Status: DC | PRN

## 2016-10-09 MED ORDER — LATANOPROST 0.005 % OP SOLN
1.0000 [drp] | Freq: Every day | OPHTHALMIC | Status: DC
Start: 2016-10-09 — End: 2016-10-11
  Administered 2016-10-09 – 2016-10-10 (×2): 1 [drp] via OPHTHALMIC
  Filled 2016-10-09: qty 2.5

## 2016-10-09 MED ORDER — ACETAMINOPHEN 650 MG RE SUPP: 650.0000 mg | Freq: Four times a day (QID) | RECTAL | Status: DC | PRN

## 2016-10-09 MED ORDER — MIDAZOLAM HCL 2 MG/2ML IJ SOLN
INTRAMUSCULAR | Status: AC
  Filled 2016-10-09: qty 2

## 2016-10-09 MED ORDER — BUPIVACAINE LIPOSOME 1.3 % IJ SUSP
INTRAMUSCULAR | Status: DC | PRN
  Administered 2016-10-09: 20 mL

## 2016-10-09 MED ORDER — LAMOTRIGINE 100 MG PO TABS: 100.0000 mg | ORAL_TABLET | Freq: Two times a day (BID) | ORAL | Status: DC

## 2016-10-09 MED ORDER — LACTATED RINGERS IV SOLN
INTRAVENOUS | Status: DC
Start: 2016-10-09 — End: 2016-10-11

## 2016-10-09 MED ORDER — PROPOFOL 500 MG/50ML IV EMUL
INTRAVENOUS | Status: DC | PRN
  Administered 2016-10-09: 25 ug/kg/min via INTRAVENOUS

## 2016-10-09 MED ORDER — PHENOL 1.4 % MT LIQD
1.0000 | OROMUCOSAL | Status: DC | PRN
Start: 2016-10-09 — End: 2016-10-11

## 2016-10-09 MED ORDER — LOSARTAN POTASSIUM 50 MG PO TABS
50.0000 mg | ORAL_TABLET | Freq: Every day | ORAL | Status: DC
  Administered 2016-10-10 – 2016-10-11 (×2): 50 mg via ORAL
  Filled 2016-10-09 (×2): qty 1

## 2016-10-09 MED ORDER — HYDROCODONE-ACETAMINOPHEN 5-325 MG PO TABS
1.0000 | ORAL_TABLET | ORAL | Status: DC | PRN
  Administered 2016-10-09 – 2016-10-11 (×10): 2 via ORAL
  Filled 2016-10-09 (×11): qty 2

## 2016-10-09 MED ORDER — BUPIVACAINE IN DEXTROSE 0.75-8.25 % IT SOLN
INTRATHECAL | Status: DC | PRN
  Administered 2016-10-09: 2 mL via INTRATHECAL

## 2016-10-09 MED ORDER — BUPIVACAINE LIPOSOME 1.3 % IJ SUSP
20.0000 mL | INTRAMUSCULAR | Status: DC
  Filled 2016-10-09: qty 20

## 2016-10-09 MED ORDER — PHENYLEPHRINE HCL 10 MG/ML IJ SOLN
INTRAMUSCULAR | Status: DC | PRN
  Administered 2016-10-09 (×3): 80 ug via INTRAVENOUS

## 2016-10-09 MED ORDER — METHOCARBAMOL 1000 MG/10ML IJ SOLN
500.0000 mg | Freq: Four times a day (QID) | INTRAMUSCULAR | Status: DC | PRN
  Filled 2016-10-09: qty 5

## 2016-10-09 MED ORDER — LIDOCAINE 2% (20 MG/ML) 5 ML SYRINGE
INTRAMUSCULAR | Status: AC
  Filled 2016-10-09: qty 5

## 2016-10-09 MED ORDER — ONDANSETRON HCL 4 MG/2ML IJ SOLN: 4.0000 mg | Freq: Four times a day (QID) | INTRAMUSCULAR | Status: DC | PRN

## 2016-10-09 MED ORDER — LACTATED RINGERS IV SOLN
INTRAVENOUS | Status: DC
  Administered 2016-10-09: 12:00:00 via INTRAVENOUS

## 2016-10-09 MED ORDER — PROPOFOL 10 MG/ML IV BOLUS
INTRAVENOUS | Status: AC
  Filled 2016-10-09: qty 40

## 2016-10-09 MED ORDER — ALUM & MAG HYDROXIDE-SIMETH 200-200-20 MG/5ML PO SUSP: 30.0000 mL | ORAL | Status: DC | PRN

## 2016-10-09 MED ORDER — BISACODYL 5 MG PO TBEC
5.0000 mg | DELAYED_RELEASE_TABLET | Freq: Every day | ORAL | Status: DC | PRN
Start: 2016-10-09 — End: 2016-10-11

## 2016-10-09 MED ORDER — ACETAMINOPHEN 325 MG PO TABS: 650.0000 mg | ORAL_TABLET | Freq: Four times a day (QID) | ORAL | Status: DC | PRN

## 2016-10-09 MED ORDER — ONDANSETRON HCL 4 MG/2ML IJ SOLN: 4.0000 mg | Freq: Once | INTRAMUSCULAR | Status: DC | PRN

## 2016-10-09 MED ORDER — LACTATED RINGERS IV SOLN
INTRAVENOUS | Status: DC | PRN
  Administered 2016-10-09 (×2): via INTRAVENOUS

## 2016-10-09 MED ORDER — 0.9 % SODIUM CHLORIDE (POUR BTL) OPTIME
TOPICAL | Status: DC | PRN
  Administered 2016-10-09: 1000 mL

## 2016-10-09 MED ORDER — ONDANSETRON HCL 4 MG PO TABS: 4.0000 mg | ORAL_TABLET | Freq: Four times a day (QID) | ORAL | Status: DC | PRN

## 2016-10-09 MED ORDER — LAMOTRIGINE 100 MG PO TABS
200.0000 mg | ORAL_TABLET | Freq: Every day | ORAL | Status: DC
  Administered 2016-10-10 – 2016-10-11 (×2): 200 mg via ORAL
  Filled 2016-10-09 (×2): qty 2

## 2016-10-09 MED ORDER — METOPROLOL SUCCINATE ER 25 MG PO TB24
25.0000 mg | ORAL_TABLET | Freq: Every day | ORAL | Status: DC
  Administered 2016-10-10 – 2016-10-11 (×2): 25 mg via ORAL
  Filled 2016-10-09 (×2): qty 1

## 2016-10-09 MED ORDER — DIPHENHYDRAMINE HCL 12.5 MG/5ML PO ELIX: 12.5000 mg | ORAL_SOLUTION | ORAL | Status: DC | PRN

## 2016-10-09 MED ORDER — CEFAZOLIN SODIUM-DEXTROSE 2-4 GM/100ML-% IV SOLN
2.0000 g | Freq: Four times a day (QID) | INTRAVENOUS | Status: AC
  Administered 2016-10-09 (×2): 2 g via INTRAVENOUS
  Filled 2016-10-09 (×2): qty 100

## 2016-10-09 MED ORDER — DOCUSATE SODIUM 100 MG PO CAPS
100.0000 mg | ORAL_CAPSULE | Freq: Two times a day (BID) | ORAL | Status: DC
  Administered 2016-10-09 – 2016-10-11 (×4): 100 mg via ORAL
  Filled 2016-10-09 (×4): qty 1

## 2016-10-09 MED ORDER — BUPIVACAINE-EPINEPHRINE (PF) 0.5% -1:200000 IJ SOLN
INTRAMUSCULAR | Status: DC | PRN
  Administered 2016-10-09: 20 mL

## 2016-10-09 MED ORDER — TRANEXAMIC ACID 1000 MG/10ML IV SOLN
1000.0000 mg | INTRAVENOUS | Status: AC
  Administered 2016-10-09: 1000 mg via INTRAVENOUS
  Filled 2016-10-09: qty 10

## 2016-10-09 MED ORDER — METOCLOPRAMIDE HCL 5 MG PO TABS: 5.0000 mg | ORAL_TABLET | Freq: Three times a day (TID) | ORAL | Status: DC | PRN

## 2016-10-09 MED ORDER — CEFAZOLIN SODIUM-DEXTROSE 2-4 GM/100ML-% IV SOLN: 2.0000 g | INTRAVENOUS | Status: DC

## 2016-10-09 MED ORDER — CEFAZOLIN SODIUM-DEXTROSE 2-4 GM/100ML-% IV SOLN
2.0000 g | INTRAVENOUS | Status: AC
  Administered 2016-10-09: 2 g via INTRAVENOUS
  Filled 2016-10-09: qty 100

## 2016-10-09 MED ORDER — PHENYLEPHRINE 40 MCG/ML (10ML) SYRINGE FOR IV PUSH (FOR BLOOD PRESSURE SUPPORT)
PREFILLED_SYRINGE | INTRAVENOUS | Status: AC
  Filled 2016-10-09: qty 10

## 2016-10-09 MED ORDER — TRANEXAMIC ACID 1000 MG/10ML IV SOLN
1000.0000 mg | Freq: Once | INTRAVENOUS | Status: AC
  Administered 2016-10-09: 1000 mg via INTRAVENOUS
  Filled 2016-10-09: qty 10

## 2016-10-09 MED ORDER — DEXTROSE 5 % IV SOLN
INTRAVENOUS | Status: DC | PRN
  Administered 2016-10-09: 20 ug/min via INTRAVENOUS

## 2016-10-09 MED ORDER — BUPIVACAINE-EPINEPHRINE (PF) 0.5% -1:200000 IJ SOLN
INTRAMUSCULAR | Status: AC
  Filled 2016-10-09: qty 30

## 2016-10-09 MED ORDER — METHOCARBAMOL 500 MG PO TABS
ORAL_TABLET | ORAL | Status: AC
  Filled 2016-10-09: qty 1

## 2016-10-09 MED ORDER — PHENYLEPHRINE HCL 10 MG/ML IJ SOLN
INTRAMUSCULAR | Status: AC
  Filled 2016-10-09: qty 1

## 2016-10-09 MED ORDER — TRANEXAMIC ACID 1000 MG/10ML IV SOLN
INTRAVENOUS | Status: DC | PRN
  Administered 2016-10-09: 2000 mg via TOPICAL

## 2016-10-09 SURGICAL SUPPLY — 65 items
BAG DECANTER FOR FLEXI CONT (MISCELLANEOUS) ×2 IMPLANT
BANDAGE ACE 4X5 VEL STRL LF (GAUZE/BANDAGES/DRESSINGS) ×1 IMPLANT
BANDAGE ESMARK 6X9 LF (GAUZE/BANDAGES/DRESSINGS) ×1 IMPLANT
BLADE SAGITTAL 25.0X1.19X90 (BLADE) ×1 IMPLANT
BLADE SAGITTAL 25.0X1.19X90MM (BLADE) ×1
BLADE SAW SGTL 13.0X1.19X90.0M (BLADE) IMPLANT
BLADE SURG ROTATE 9660 (MISCELLANEOUS) IMPLANT
BNDG CMPR 9X6 STRL LF SNTH (GAUZE/BANDAGES/DRESSINGS) ×1
BNDG CMPR MED 10X6 ELC LF (GAUZE/BANDAGES/DRESSINGS) ×1
BNDG ELASTIC 6X10 VLCR STRL LF (GAUZE/BANDAGES/DRESSINGS) ×3 IMPLANT
BNDG ESMARK 6X9 LF (GAUZE/BANDAGES/DRESSINGS) ×3
BNDG GAUZE ELAST 4 BULKY (GAUZE/BANDAGES/DRESSINGS) ×6 IMPLANT
BOWL SMART MIX CTS (DISPOSABLE) ×3 IMPLANT
CAP KNEE TOTAL 3 SIGMA ×2 IMPLANT
CEMENT HV SMART SET (Cement) ×6 IMPLANT
CLOSURE WOUND 1/2 X4 (GAUZE/BANDAGES/DRESSINGS) ×2
COVER SURGICAL LIGHT HANDLE (MISCELLANEOUS) ×3 IMPLANT
CUFF TOURNIQUET SINGLE 34IN LL (TOURNIQUET CUFF) ×3 IMPLANT
CUFF TOURNIQUET SINGLE 44IN (TOURNIQUET CUFF) IMPLANT
DECANTER SPIKE VIAL GLASS SM (MISCELLANEOUS) ×1 IMPLANT
DRAPE EXTREMITY T 121X128X90 (DRAPE) ×3 IMPLANT
DRAPE PROXIMA HALF (DRAPES) ×3 IMPLANT
DRAPE U-SHAPE 47X51 STRL (DRAPES) ×3 IMPLANT
DRSG ADAPTIC 3X8 NADH LF (GAUZE/BANDAGES/DRESSINGS) ×3 IMPLANT
DRSG PAD ABDOMINAL 8X10 ST (GAUZE/BANDAGES/DRESSINGS) ×5 IMPLANT
DURAPREP 26ML APPLICATOR (WOUND CARE) ×3 IMPLANT
ELECT REM PT RETURN 9FT ADLT (ELECTROSURGICAL) ×3
ELECTRODE REM PT RTRN 9FT ADLT (ELECTROSURGICAL) ×1 IMPLANT
GAUZE SPONGE 4X4 12PLY STRL (GAUZE/BANDAGES/DRESSINGS) ×3 IMPLANT
GLOVE BIO SURGEON STRL SZ8 (GLOVE) ×6 IMPLANT
GLOVE BIOGEL PI IND STRL 8 (GLOVE) ×2 IMPLANT
GLOVE BIOGEL PI INDICATOR 8 (GLOVE) ×4
GOWN STRL REUS W/ TWL LRG LVL3 (GOWN DISPOSABLE) ×1 IMPLANT
GOWN STRL REUS W/ TWL XL LVL3 (GOWN DISPOSABLE) ×2 IMPLANT
GOWN STRL REUS W/TWL LRG LVL3 (GOWN DISPOSABLE) ×6
GOWN STRL REUS W/TWL XL LVL3 (GOWN DISPOSABLE) ×6
HANDPIECE INTERPULSE COAX TIP (DISPOSABLE) ×3
HOOD PEEL AWAY FACE SHEILD DIS (HOOD) ×8 IMPLANT
IMMOBILIZER KNEE 22 UNIV (SOFTGOODS) ×3 IMPLANT
KIT BASIN OR (CUSTOM PROCEDURE TRAY) ×3 IMPLANT
KIT ROOM TURNOVER OR (KITS) ×3 IMPLANT
MANIFOLD NEPTUNE II (INSTRUMENTS) ×3 IMPLANT
NDL HYPO 21X1 ECLIPSE (NEEDLE) ×1 IMPLANT
NEEDLE 22X1 1/2 (OR ONLY) (NEEDLE) ×2 IMPLANT
NEEDLE HYPO 21X1 ECLIPSE (NEEDLE) IMPLANT
NS IRRIG 1000ML POUR BTL (IV SOLUTION) ×3 IMPLANT
PACK TOTAL JOINT (CUSTOM PROCEDURE TRAY) ×3 IMPLANT
PACK UNIVERSAL I (CUSTOM PROCEDURE TRAY) ×1 IMPLANT
PAD ARMBOARD 7.5X6 YLW CONV (MISCELLANEOUS) ×6 IMPLANT
SET HNDPC FAN SPRY TIP SCT (DISPOSABLE) ×1 IMPLANT
STAPLER VISISTAT 35W (STAPLE) IMPLANT
STRIP CLOSURE SKIN 1/2X4 (GAUZE/BANDAGES/DRESSINGS) ×3 IMPLANT
SUCTION FRAZIER HANDLE 10FR (MISCELLANEOUS) ×2
SUCTION TUBE FRAZIER 10FR DISP (MISCELLANEOUS) IMPLANT
SUT MNCRL AB 3-0 PS2 18 (SUTURE) ×2 IMPLANT
SUT VIC AB 0 CT1 27 (SUTURE) ×3
SUT VIC AB 0 CT1 27XBRD ANBCTR (SUTURE) ×2 IMPLANT
SUT VIC AB 2-0 CT1 27 (SUTURE) ×6
SUT VIC AB 2-0 CT1 TAPERPNT 27 (SUTURE) ×2 IMPLANT
SUT VLOC 180 0 24IN GS25 (SUTURE) ×3 IMPLANT
SYR 50ML LL SCALE MARK (SYRINGE) ×3 IMPLANT
TOWEL OR 17X24 6PK STRL BLUE (TOWEL DISPOSABLE) ×3 IMPLANT
TOWEL OR 17X26 10 PK STRL BLUE (TOWEL DISPOSABLE) ×3 IMPLANT
TRAY FOLEY CATH 14FR (SET/KITS/TRAYS/PACK) IMPLANT
WATER STERILE IRR 1000ML POUR (IV SOLUTION) ×2 IMPLANT

## 2016-10-09 NOTE — Evaluation (Signed)
Physical Therapy Evaluation Patient Details Name: Maureen Keller MRN: [id number] DOB: Jun 17, 1946 Today's Date: 10/09/2016   History of Present Illness  Patient is a 70 y/o female with hx of HTN, HLD, pulmonary HTN, mitral valve disorder, glaucoma, depression, and CKD presents s/p right TKA.  Clinical Impression  Patient presents with pain and post surgical deficits s/p above surgery. Tolerated transfers and gait training with Min A-min guard assist for safety. Education re: knee precautions, positioning, exercises. Pt plans to have friend stay with her for 1 week at d/c. Will need to negotiate stairs prior to discharge. Will follow acutely to maximize independence and mobility prior to return home.     Follow Up Recommendations Home health PT;Supervision - Intermittent    Equipment Recommendations  Rolling Holck with 5" wheels;3in1 (PT)    Recommendations for Other Services OT consult     Precautions / Restrictions Precautions Precautions: Knee Precaution Booklet Issued: No Precaution Comments: Reviewed no pillow under knee and precautions. Restrictions Weight Bearing Restrictions: Yes RLE Weight Bearing: Weight bearing as tolerated      Mobility  Bed Mobility Overal bed mobility: Needs Assistance Bed Mobility: Supine to Sit     Supine to sit: Supervision;HOB elevated     General bed mobility comments: No assist needed. Use of rails for support.   Transfers Overall transfer level: Needs assistance Equipment used: Rolling [place] (2 wheeled) Transfers: Sit to/from Stand Sit to Stand: Min assist;Min guard         General transfer comment: Assist initially to stand to from EOB progressing to Min guard assist with cues for hand placement/technique.  Ambulation/Gait Ambulation/Gait assistance: Min guard Ambulation Distance (Feet): 20 Feet (x2 bouts) Assistive device: Rolling [place] (2 wheeled) Gait Pattern/deviations: Step-to pattern;Step-through pattern;Decreased  stance time - right;Decreased step length - left;Trunk flexed Gait velocity: decreased Gait velocity interpretation: Below normal speed for age/gender General Gait Details: Slow, mostly steady gait with cues for sequencing.  Stairs            Wheelchair Mobility    Modified Rankin (Stroke Patients Only)       Balance Overall balance assessment: Needs assistance Sitting-balance support: Feet supported;No upper extremity supported Sitting balance-Leahy Scale: Good Sitting balance - Comments: Able to perform pericare without assist,.    Standing balance support: During functional activity Standing balance-Leahy Scale: Poor Standing balance comment: Requires BUE support in standing.                             Pertinent Vitals/Pain Pain Assessment: Faces Faces Pain Scale: Hurts little more Pain Location: right knee with mobility Pain Descriptors / Indicators: Sore;Operative site guarding Pain Intervention(s): Monitored during session;Repositioned;Premedicated before session    [place] expects to be discharged to:: Private residence Living Arrangements: Alone Available Help at Discharge: Friend(s);Available 24 hours/day (for the first week) Type of Home: House Home Access: Stairs to enter Entrance Stairs-Rails: Right;Left;Can reach both Entrance Stairs-Number of Steps: 3 Home Layout: One level Home Equipment: None      Prior Function Level of Independence: Independent         Comments: Walks dogs, drives, cooks, cleans.     Hand Dominance        Extremity/Trunk Assessment   Upper Extremity Assessment: Defer to OT evaluation           Lower Extremity Assessment: RLE deficits/detail RLE Deficits / Details: Limited AROM/strength secondary to pain/post op  Communication   Communication: No difficulties  Cognition Arousal/Alertness: Awake/alert Behavior During Therapy: WFL for tasks assessed/performed Overall  Cognitive Status: Within Functional Limits for tasks assessed                      General Comments      Exercises Total Joint Exercises Ankle Circles/Pumps: Both;10 reps;Supine Quad Sets: Both;10 reps;Supine Gluteal Sets: Both;10 reps;Supine   Assessment/Plan    PT Assessment Patient needs continued PT services  PT Problem List Decreased strength;Decreased mobility;Decreased activity tolerance;Decreased balance;Pain;Decreased range of motion          PT Treatment Interventions DME instruction;Therapeutic activities;Gait training;Therapeutic exercise;Patient/family education;Balance training;Functional mobility training;Stair training    PT Goals (Current goals can be found in the Care Plan section)  Acute Rehab PT Goals Patient Stated Goal: to get back to walking her dogs PT Goal Formulation: With patient Time For Goal Achievement: 10/23/16 Potential to Achieve Goals: Good    Frequency 7X/week   Barriers to discharge Inaccessible home environment stairs to enter home    Co-evaluation               End of Session Equipment Utilized During Treatment: Gait belt Activity Tolerance: Patient tolerated treatment well Patient left: in chair;with call bell/phone within reach Nurse Communication: Mobility status         Time: 1500-1530 PT Time Calculation (min) (ACUTE ONLY): 30 min   Charges:   PT Evaluation $PT Eval Moderate Complexity: 1 Procedure PT Treatments $Gait Training: 8-22 mins   PT G Codes:        Kortny Lirette A Kyreese Chio 10/09/2016, 3:36 PM  Wray Kearns, [place], DPT [phone]

## 2016-10-09 NOTE — Transfer of Care (Signed)
Immediate Anesthesia Transfer of Care Note  Patient: Maureen Keller  Procedure(s) Performed: Procedure(s): TOTAL RIGHT KNEE ARTHROPLASTY (Right)  Patient Location: PACU  Anesthesia Type:Spinal  Level of Consciousness: awake, alert  and oriented  Airway & Oxygen Therapy: Patient Spontanous Breathing and Patient connected to nasal cannula oxygen  Post-op Assessment: Report given to RN and Post -op Vital signs reviewed and stable  Post vital signs: Reviewed and stable  Last Vitals:  Vitals:   10/09/16 0555 10/09/16 0557  BP: (!) 130/52   Pulse: 67   Resp: 18   Temp:  36.3 C    Last Pain:  Vitals:   10/09/16 0557  TempSrc: Oral  PainSc:       Patients Stated Pain Goal: 2 ([id number])  Complications: No apparent anesthesia complications

## 2016-10-09 NOTE — Op Note (Signed)
PREOP DIAGNOSIS: DJD RIGHT KNEE POSTOP DIAGNOSIS: same PROCEDURE: RIGHT TKR ANESTHESIA: Spinal and MAC ATTENDING SURGEON: Vidur Knust G ASSISTANT: Loni Dolly PA  INDICATIONS FOR PROCEDURE: Maureen Keller is a 70 y.o. female who has struggled for a long time with pain due to degenerative arthritis of the right knee.  The patient has failed many conservative non-operative measures and at this point has pain which limits the ability to sleep and walk.  The patient is offered total knee replacement.  Informed operative consent was obtained after discussion of possible risks of anesthesia, infection, neurovascular injury, DVT, and death.  The importance of the post-operative rehabilitation protocol to optimize result was stressed extensively with the patient.  SUMMARY OF FINDINGS AND PROCEDURE:  Maureen Keller was taken to the operative suite where under the above anesthesia a right knee replacement was performed.  There were advanced degenerative changes and the bone quality was good.  We used the DePuy LCS system and placed size standard femur, 3 tibia, 35 mm all polyethylene patella, and a size 10 mm spacer.  Loni Dolly PA-C assisted throughout and was invaluable to the completion of the case in that he helped retract and maintain exposure while I placed components.  He also helped close thereby minimizing OR time.  The patient was admitted for appropriate post-op care to include perioperative antibiotics and mechanical and pharmacologic measures for DVT prophylaxis.  DESCRIPTION OF PROCEDURE:  Maureen Keller was taken to the operative suite where the above anesthesia was applied.  The patient was positioned supine and prepped and draped in normal sterile fashion.  An appropriate time out was performed.  After the administration of kefzol pre-op antibiotic the leg was elevated and exsanguinated and a tourniquet inflated. A standard longitudinal incision was made on the anterior knee.  Dissection was  carried down to the extensor mechanism.  All appropriate anti-infective measures were used including the pre-operative antibiotic, betadine impregnated drape, and closed hooded exhaust systems for each member of the surgical team.  A medial parapatellar incision was made in the extensor mechanism and the knee cap flipped and the knee flexed.  Some residual meniscal tissues were removed along with any remaining ACL/PCL tissue.  A guide was placed on the tibia and a flat cut was made on it's superior surface.  An intramedullary guide was placed in the femur and was utilized to make anterior and posterior cuts creating an appropriate flexion gap.  A second intramedullary guide was placed in the femur to make a distal cut properly balancing the knee with an extension gap equal to the flexion gap.  The three bones sized to the above mentioned sizes and the appropriate guides were placed and utilized.  A trial reduction was done and the knee easily came to full extension and the patella tracked well on flexion.  The trial components were removed and all bones were cleaned with pulsatile lavage and then dried thoroughly.  Cement was mixed and was pressurized onto the bones followed by placement of the aforementioned components.  Excess cement was trimmed and pressure was held on the components until the cement had hardened.  The tourniquet was deflated and a small amount of bleeding was controlled with cautery and pressure.  The knee was irrigated thoroughly.  The extensor mechanism was re-approximated with V-loc suture in running fashion.  The knee was flexed and the repair was solid.  The subcutaneous tissues were re-approximated with #0 and #2-0 vicryl and the skin closed with a  subcuticular stitch and steristrips.  A sterile dressing was applied.  Intraoperative fluids, EBL, and tourniquet time can be obtained from anesthesia records.  DISPOSITION:  The patient was taken to recovery room in stable condition and  admitted for appropriate post-op care to include peri-operative antibiotic and DVT prophylaxis with mechanical and pharmacologic measures.  Braxden Lovering G 10/09/2016, 8:57 AM

## 2016-10-09 NOTE — Interval H&P Note (Signed)
History and Physical Interval Note:  10/09/2016 7:19 AM  Maureen Keller  has presented today for surgery, with the diagnosis of Osteoarthritis RIGHT  knee  The various methods of treatment have been discussed with the patient and family. After consideration of risks, benefits and other options for treatment, the patient has consented to  Procedure(s): TOTAL RIGHT KNEE ARTHROPLASTY (Right) as a surgical intervention .  The patient's history has been reviewed, patient examined, no change in status, stable for surgery.  I have reviewed the patient's chart and labs.  Questions were answered to the patient's satisfaction.     Jaelen Soth G

## 2016-10-09 NOTE — Anesthesia Postprocedure Evaluation (Signed)
Anesthesia Post Note  Patient: Maureen Keller  Procedure(s) Performed: Procedure(s) (LRB): TOTAL RIGHT KNEE ARTHROPLASTY (Right)  Patient location during evaluation: PACU Anesthesia Type: Spinal and Regional Level of consciousness: oriented and awake and alert Pain management: pain level controlled Vital Signs Assessment: post-procedure vital signs reviewed and stable Respiratory status: spontaneous breathing, respiratory function stable and patient connected to nasal cannula oxygen Cardiovascular status: blood pressure returned to baseline and stable Postop Assessment: no headache, no backache, patient able to bend at knees, no signs of nausea or vomiting and spinal receding Anesthetic complications: no    Last Vitals:  Vitals:   10/09/16 1015 10/09/16 1030  BP: (!) 127/58 (!) 109/53  Pulse: (!) 54 (!) 51  Resp: 16 17  Temp:      Last Pain:  Vitals:   10/09/16 1030  TempSrc:   PainSc: 0-No pain    LLE Motor Response: No movement due to regional block (10/09/16 1030) LLE Sensation: No sensation (absent) (10/09/16 1030) RLE Motor Response: No movement due to regional block (10/09/16 1030) RLE Sensation: No sensation (absent) (10/09/16 1030) L Sensory Level: L2-Upper inner thigh, upper buttock (10/09/16 1030) R Sensory Level: L2-Upper inner thigh, upper buttock (10/09/16 1030)  Catalina Gravel

## 2016-10-09 NOTE — Anesthesia Procedure Notes (Signed)
Spinal  Patient location during procedure: OR Staffing Anesthesiologist: Catalina Gravel Performed: anesthesiologist  Preanesthetic Checklist Completed: patient identified, surgical consent, pre-op evaluation, timeout performed, IV checked, risks and benefits discussed and monitors and equipment checked Spinal Block Patient position: sitting Prep: site prepped and draped and DuraPrep Patient monitoring: continuous pulse ox and blood pressure Approach: midline Location: L3-4 Injection technique: single-shot Needle Needle type: Pencan  Needle gauge: 25 G Assessment Sensory level: T8 Additional Notes Functioning IV was confirmed and monitors were applied. Sterile prep and drape, including hand hygiene, mask and sterile gloves were used. The patient was positioned and the spine was prepped. The skin was anesthetized with lidocaine.  Free flow of clear CSF was obtained prior to injecting local anesthetic into the CSF.  The spinal needle aspirated freely following injection.  The needle was carefully withdrawn.  The patient tolerated the procedure well. Consent was obtained prior to procedure with all questions answered and concerns addressed. Risks including but not limited to bleeding, infection, nerve damage, paralysis, failed block, inadequate analgesia, allergic reaction, high spinal, itching and headache were discussed and the patient wished to proceed.   Hoy Morn, MD

## 2016-10-09 NOTE — Progress Notes (Signed)
Orthopedic Tech Progress Note Patient Details:  Maureen Keller [birth year] [id number]  Patient ID: Maureen Keller, female   DOB: June 24, 1946, 70 y.o.   MRN: [id number]   Maureen Keller 10/09/2016, 10:18 AM One ortho tech visit to be deleted

## 2016-10-09 NOTE — Progress Notes (Signed)
Orthopedic Tech Progress Note Patient Details:  Maureen Keller [birth year] [id number]  CPM Right Knee CPM Right Knee: On Right Knee Flexion (Degrees): 90 Right Knee Extension (Degrees): 0 Additional Comments: trapeze bar patient helper   Hildred Priest 10/09/2016, 10:17 AM Viewed order from doctor's order list

## 2016-10-09 NOTE — Anesthesia Procedure Notes (Signed)
Anesthesia Regional Block:  Adductor canal block  Pre-Anesthetic Checklist: ,, timeout performed, Correct Patient, Correct Site, Correct Laterality, Correct Procedure, Correct Position, site marked, Risks and benefits discussed,  Surgical consent,  Pre-op evaluation,  At surgeon's request and post-op pain management  Laterality: Right  Prep: chloraprep       Needles:  Injection technique: Single-shot  Needle Type: Echogenic Needle     Needle Length: 9cm 9 cm Needle Gauge: 21 and 21 G    Additional Needles:  Procedures: ultrasound guided (picture in chart) Adductor canal block Narrative:  Injection made incrementally with aspirations every 5 mL.  Performed by: Personally  Anesthesiologist: Darianna Amy EDWARD  Additional Notes: No pain on injection. No increased resistance to injection. Injection made in 5cc increments.  Good needle visualization.  Patient tolerated procedure well.      

## 2016-10-10 ENCOUNTER — Encounter (HOSPITAL_COMMUNITY): Payer: Self-pay | Admitting: Orthopaedic Surgery

## 2016-10-10 MED ORDER — INFLUENZA VAC SPLIT QUAD 0.5 ML IM SUSY
0.5000 mL | PREFILLED_SYRINGE | INTRAMUSCULAR | Status: AC
  Administered 2016-10-11: 0.5 mL via INTRAMUSCULAR
  Filled 2016-10-10: qty 0.5

## 2016-10-10 NOTE — Care Management Note (Signed)
Case Management Note  Patient Details  Name: Maureen Keller MRN: [id number] Date of Birth: 1946/08/13  Subjective/Objective:   Right TKA                 Action/Plan: Discharge Planning:  NCM spoke to pt. [facility] preoperatively arranged for [facility]. Pt agreeable to [facility] for HH. Pt has RW and 3n1 in her room. [employer] has delivered CPM to her home. Has a friend that will stay with her post dc.    PCP  POLITE, RONALD MD  Expected Discharge Date:                Expected Discharge Plan:  [place]  In-House Referral:  NA  Discharge planning Services  CM Consult  Post Acute Care Choice:  Home Health Choice offered to:  Patient  DME Arranged:  3-N-1, [place] rolling, CPM DME Agency:  [place] [employer]  HH Arranged:  PT HH Agency:  [place]  Status of Service:  Completed, signed off  If discussed at [place] of Stay Meetings, dates discussed:    Additional Comments:  Erenest Rasher, RN 10/10/2016, 3:28 PM

## 2016-10-10 NOTE — Progress Notes (Signed)
Physical Therapy Treatment Patient Details Name: Maureen Keller MRN: [id number] DOB: 11-Dec-1946 Today's Date: 10/10/2016    History of Present Illness Patient is a 70 y/o female with hx of HTN, HLD, pulmonary HTN, mitral valve disorder, glaucoma, depression, and CKD presents s/p right TKA.    PT Comments    Pt was given pain meds prior to treatment.  Pt fell asleep during exercises, but then was able to ambulate 120 ft before needing to rest.  Pt progressed with her gait and was able to maintain an upright posture and forward gaze.  Required Vcs for R heel strike, and gait speed decreased due to pain and fatigue.  Follow Up Recommendations  Home health PT;Supervision - Intermittent     Equipment Recommendations  Rolling Betley with 5" wheels;3in1 (PT)    Recommendations for Other Services OT consult     Precautions / Restrictions Precautions Precautions: Knee Precaution Booklet Issued: No Precaution Comments: Reviewed no pillow under knee and precautions. Restrictions Weight Bearing Restrictions: Yes RLE Weight Bearing: Weight bearing as tolerated    Mobility  Bed Mobility Overal bed mobility: Needs Assistance Bed Mobility: Supine to Sit     Supine to sit: Supervision;HOB elevated Sit to supine: Min assist   General bed mobility comments: min A with R LE onto bed  Transfers Overall transfer level: Needs assistance Equipment used: Rolling [place] (2 wheeled) Transfers: Sit to/from Stand Sit to Stand: Min guard         General transfer comment: VCs required for hand placement and to lean forward on EOB.  Ambulation/Gait Ambulation/Gait assistance: Min guard Ambulation Distance (Feet): 120 Feet Assistive device: Rolling Kervin (2 wheeled) Gait Pattern/deviations: Step-to pattern;Antalgic;Decreased stride length Gait velocity: decreased Gait velocity interpretation: Below normal speed for age/gender General Gait Details: VCs needed for heel to toe pattern.  Pt  ambulated for 120 ft and then had to stop due to fatigue.   Stairs            Wheelchair Mobility    Modified Rankin (Stroke Patients Only)       Balance Overall balance assessment: Needs assistance Sitting-balance support: No upper extremity supported;Feet supported Sitting balance-Leahy Scale: Good     Standing balance support: Bilateral upper extremity supported Standing balance-Leahy Scale: Fair                      Cognition Arousal/Alertness: Awake/alert Behavior During Therapy: WFL for tasks assessed/performed Overall Cognitive Status: Within Functional Limits for tasks assessed                      Exercises Total Joint Exercises Ankle Circles/Pumps: AROM;Both;10 reps;Supine Quad Sets: AROM;Right;10 reps;Supine Towel Squeeze: AROM;Both;10 reps;Supine Short Arc Quad: AROM;Right;10 reps;Supine Hip ABduction/ADduction: AROM;Right;10 reps;Supine Straight Leg Raises: AROM;Right;10 reps;Supine Goniometric ROM: 70 degrees of R knee flexion of R knee flexion    General Comments        Pertinent Vitals/Pain Pain Assessment: Faces Pain Score: 7  Faces Pain Scale: Hurts a little bit Pain Location: R knee Pain Descriptors / Indicators: Aching;Grimacing Pain Intervention(s): Limited activity within patient's tolerance;Monitored during session;Premedicated before session;Ice applied    Home Living Family/patient expects to be discharged to:: Private residence Living Arrangements: Alone Available Help at Discharge: Friend(s);Available 24 hours/day Type of Home: House Home Access: Stairs to enter Entrance Stairs-Rails: Right;Left;Can reach both Home Layout: One level Home Equipment: None      Prior Function Level of Independence: Independent      Comments: Walks  dogs, drives, cooks, cleans.   PT Goals (current goals can now be found in the care plan section) Acute Rehab PT Goals Patient Stated Goal: to get pain under control.   PT Goal Formulation: With  patient Time For Goal Achievement: 10/23/16 Potential to Achieve Goals: Good Progress towards PT goals: Progressing toward goals    Frequency    7X/week      PT Plan Current plan remains appropriate    Co-evaluation             End of Session Equipment Utilized During Treatment: Gait belt Activity Tolerance: Patient tolerated treatment well Patient left: in chair;with call bell/phone within reach     Time: [id number] PT Time Calculation (min) (ACUTE ONLY): 35 min  Charges:  $Gait Training: 8-22 mins $Therapeutic Exercise: 8-22 mins                    G Codes:      Bary Castilla 10/29/2016, 4:19 PM Rito Ehrlich. Nida Boatman Pager [phone]

## 2016-10-10 NOTE — Progress Notes (Signed)
Orthopedic Tech Progress Note Patient Details:  Maureen Keller [birth year] [id number] Put patient in CPM around 1920 CPM Right Knee CPM Right Knee: On Right Knee Flexion (Degrees): 60 Right Knee Extension (Degrees): 0 Additional Comments: trapeze bar patient helper   [name] 10/10/2016, 10:41 PM

## 2016-10-10 NOTE — Progress Notes (Signed)
Subjective: 1 Day Post-Op Procedure(s) (LRB): TOTAL RIGHT KNEE ARTHROPLASTY (Right)  Activity level:  wbat Diet tolerance:  ok Voiding:  ok Patient reports pain as mild and moderate.    Objective: Vital signs in last 24 hours: Temp:  [97.1 F (36.2 C)-98.9 F (37.2 C)] 98.9 F (37.2 C) (10/25 0500) Pulse Rate:  [47-67] 54 (10/25 0500) Resp:  [10-19] 16 (10/25 0500) BP: (107-152)/(40-95) 113/40 (10/25 0500) SpO2:  [95 %-100 %] 96 % (10/25 0500)  Labs: No results for input(s): HGB in the last 72 hours. No results for input(s): WBC, RBC, HCT, PLT in the last 72 hours. No results for input(s): NA, K, CL, CO2, BUN, CREATININE, GLUCOSE, CALCIUM in the last 72 hours. No results for input(s): LABPT, INR in the last 72 hours.  Physical Exam:  Neurologically intact ABD soft Neurovascular intact Sensation intact distally Intact pulses distally Dorsiflexion/Plantar flexion intact Incision: dressing C/D/I and no drainage No cellulitis present Compartment soft  Assessment/Plan:  1 Day Post-Op Procedure(s) (LRB): TOTAL RIGHT KNEE ARTHROPLASTY (Right) Advance diet Up with therapy D/C IV fluids Plan for discharge tomorrow Discharge home with home health if doing well and cleared by PT. Follow up in office 2 weeks post op. Continue on ASA 325mg  BID x 2 weeks post op. I will change dressing to aquacel tomorrow.  Jaeshawn Silvio, Larwance Sachs 10/10/2016, 7:55 AM

## 2016-10-10 NOTE — Consult Note (Signed)
           [facility] CM Primary Care Navigator  10/10/2016  Maureen Keller 06/30/1946 [id number]   Patient seen at the bedside to identify possible discharge needs.  Patient shared that she struggled with pain to right knee for a long time which limits her ability to walk and sleep, and the non-surgical treatments failed that led patient to decide to undergo surgery, hence this admission.  Plan is to discharge home with home health services if doing well and cleared by PT.  Patient confirms that primary care provider is Dr. Seward Jadwiga with [facility] at [facility]. Transportation to doctors' appointments will be provided by friend Education officer, museum) who lives close by or [employer] (wherein friend Cathie used to be a Psychologist, occupational).  Patient states using [facility] [place] to obtain medications with no problem. Patient does her own medication management at home using "pill box" system. Patient lives alone and independent. Friends Jonnie Kind and [place]) will be the primary caregivers at home if needed per patient.  Patient had expressed understanding to call primary care provider's office once discharged, for a post discharge follow-up appointment within a week or sooner if needed.  Patient letter provided as a reminder.  Patient voiced no concerns or needs at this time.  For questions, please contact:  Dannielle Huh, BSN, RN- [facility] Primary Care Navigator  Telephone: [phone] Page

## 2016-10-10 NOTE — Progress Notes (Signed)
Physical Therapy Treatment Patient Details Name: OAKLAND COURTEMANCHE MRN: [id number] DOB: Jan 28, 1946 Today's Date: 10/10/2016    History of Present Illness Patient is a 70 y/o female with hx of HTN, HLD, pulmonary HTN, mitral valve disorder, glaucoma, depression, and CKD presents s/p right TKA.    PT Comments    Pt required increased time secondary to pain.  After sitting edge of bed patient tearful and nurse came to administer IV pain meds.  Pt reports feeling slightly dizzy with IV dilaudid but able to advance gait.  Will f/u in pm and coordinate pain meds.    Follow Up Recommendations  Home health PT;Supervision - Intermittent     Equipment Recommendations  Rolling Whiteley with 5" wheels;3in1 (PT)    Recommendations for Other Services       Precautions / Restrictions Precautions Precautions: Knee Precaution Comments: Reviewed no pillow under knee and precautions. Restrictions Weight Bearing Restrictions: Yes RLE Weight Bearing: Weight bearing as tolerated    Mobility  Bed Mobility Overal bed mobility: Needs Assistance Bed Mobility: Supine to Sit     Supine to sit: Supervision;HOB elevated     General bed mobility comments: No assist needed. Use of rails for support. Required increased time and assist from no operative limb to advance R LE.    Transfers Overall transfer level: Needs assistance Equipment used: Rolling Koeneman (2 wheeled) Transfers: Sit to/from Stand Sit to Stand: Min guard         General transfer comment: Cues for hand placement and forward advancement of RLE.    Ambulation/Gait Ambulation/Gait assistance: Min guard Ambulation Distance (Feet): 40 Feet Assistive device: Rolling Parke (2 wheeled) Gait Pattern/deviations: Step-to pattern;Narrow base of support;Trunk flexed;Decreased stride length     General Gait Details: Cues for sequencing, forward gaze and right heel strike.     Stairs            Wheelchair Mobility    Modified  Rankin (Stroke Patients Only)       Balance Overall balance assessment: Needs assistance   Sitting balance-Leahy Scale: Good       Standing balance-Leahy Scale: Fair                      Cognition Arousal/Alertness: Awake/alert Behavior During Therapy: WFL for tasks assessed/performed Overall Cognitive Status: Within Functional Limits for tasks assessed                      Exercises Total Joint Exercises Ankle Circles/Pumps: AROM;Both;10 reps;Supine Quad Sets: AROM;Right;10 reps;Supine Heel Slides: AROM;Right;10 reps;Supine Hip ABduction/ADduction: AROM;Right;10 reps;Supine Straight Leg Raises: AROM;Right;10 reps;Supine    General Comments        Pertinent Vitals/Pain Pain Assessment: 0-10 Pain Score: 9  Pain Location: R knee, crying edge of bed Pain Descriptors / Indicators: Sore;Grimacing;Guarding Pain Intervention(s): Monitored during session;Repositioned;Ice applied;Patient requesting pain meds-RN notified;RN gave pain meds during session (after IV dilaudid pt reports pain improved.)    Home Living Family/patient expects to be discharged to:: Private residence Living Arrangements: Alone Available Help at Discharge: Friend(s);Available 24 hours/day Type of Home: House Home Access: Stairs to enter Entrance Stairs-Rails: Right;Left;Can reach both Home Layout: One level Home Equipment: None      Prior Function Level of Independence: Independent      Comments: Walks dogs, drives, cooks, cleans.   PT Goals (current goals can now be found in the care plan section) Acute Rehab PT Goals Patient Stated Goal: to get pain under control.  Potential to Achieve Goals: Good Progress towards PT goals: Progressing toward goals    Frequency    7X/week      PT Plan Current plan remains appropriate    Co-evaluation             End of Session Equipment Utilized During Treatment: Gait belt Activity Tolerance: Patient tolerated treatment  well Patient left: in chair;with call bell/phone within reach     Time: 1132-1210 PT Time Calculation (min) (ACUTE ONLY): 38 min  Charges:  $Gait Training: 8-22 mins $Therapeutic Exercise: 8-22 mins $Therapeutic Activity: 8-22 mins                    G Codes:      Cristela Blue 11/07/16, 12:15 PM  Governor Rooks, PTA pager [phone]

## 2016-10-10 NOTE — Evaluation (Signed)
Occupational Therapy Evaluation Patient Details Name: Maureen Keller MRN: [id number] DOB: Apr 01, 1946 Today's Date: 10/10/2016    History of Present Illness Patient is a 70 y/o female with hx of HTN, HLD, pulmonary HTN, mitral valve disorder, glaucoma, depression, and CKD presents s/p right TKA.   Clinical Impression   Pt with decline in function and safety with ADLs and ADL mobility with decreased balance and endurance. Pt would benefit from acute OT services to address impairments to increase level of function and safety   Follow Up Recommendations  Home health OT;Supervision - Intermittent    Equipment Recommendations  3 in 1 bedside comode;Other (comment) (ADL A/E kit)    Recommendations for Other Services       Precautions / Restrictions Precautions Precautions: Knee Precaution Booklet Issued: No Precaution Comments: Reviewed no pillow under knee and precautions. Restrictions Weight Bearing Restrictions: Yes RLE Weight Bearing: Weight bearing as tolerated      Mobility Bed Mobility Overal bed mobility: Needs Assistance Bed Mobility: Supine to Sit;Sit to Supine     Supine to sit: Supervision;HOB elevated Sit to supine: Min assist   General bed mobility comments: min A with R LE onto bed  Transfers Overall transfer level: Needs assistance Equipment used: Rolling Alm[place] (2 wheeled) Transfers: Sit to/from Stand Sit to Stand: Min guard         General transfer comment: Cues for hand placement and forward advancement of RLE.      Balance Overall balance assessment: Needs assistance   Sitting balance-Leahy Scale: Good       Standing balance-Leahy Scale: Fair                              ADL Overall ADL's : Needs assistance/impaired     Grooming: Wash/dry hands;Wash/dry face;Min guard   Upper Body Bathing: Set up;Sitting   Lower Body Bathing: Moderate assistance   Upper Body Dressing : Set up;Sitting   Lower Body Dressing: Maximal  assistance   Toilet Transfer: Min guard;RW;Ambulation;Comfort height toilet;Cueing for safety   Toileting- Clothing Manipulation and Hygiene: Moderate assistance   Tub/ Shower Transfer: Min guard;3 in [place];Ambulation Tub/Shower Transfer Details (indicate cue type and reason): simulated Functional mobility during ADLs: Min guard General ADL Comments: Educated pt on ADL A/E and DME for bathroom. RW and 3 in 1 have been delivered to pt's room     Vision  wears glasses, no change form baseline              Pertinent Vitals/Pain Pain Assessment: 0-10 Pain Score: 7  Pain Location: R knee Pain Descriptors / Indicators: Aching;Grimacing;Guarding;Sore Pain Intervention(s): Monitored during session;Ice applied;Repositioned     Hand Dominance Right   Extremity/Trunk Assessment Upper Extremity Assessment Upper Extremity Assessment: Overall WFL for tasks assessed   Lower Extremity Assessment Lower Extremity Assessment: Defer to PT evaluation   Cervical / Trunk Assessment Cervical / Trunk Assessment: Normal   Communication Communication Communication: No difficulties   Cognition Arousal/Alertness: Awake/alert Behavior During Therapy: WFL for tasks assessed/performed Overall Cognitive Status: Within Functional Limits for tasks assessed                     General Comments   Pt very pleasant and cooperated, motivated                 Home Living Family/patient expects to be discharged to:: Private residence Living Arrangements: Alone Available Help at Discharge:  Friend(s);Available 24 hours/day Type of Home: House Home Access: Stairs to enter [employer] of Steps: 3 Entrance Stairs-Rails: Right;Left;Can reach both Home Layout: One level     Bathroom Shower/Tub: Tub/shower unit;Walk-in shower   Bathroom Toilet: Standard     Home Equipment: None          Prior Functioning/Environment Level of Independence: Independent         Comments: Walks dogs, drives, cooks, cleans.        OT Problem List: Pain;Decreased knowledge of use of DME or AE;Decreased activity tolerance   OT Treatment/Interventions: Self-care/ADL training;DME and/or AE instruction;Therapeutic activities;Patient/family education    OT Goals(Current goals can be found in the care plan section) Acute Rehab OT Goals Patient Stated Goal: to get pain under control.   OT Goal Formulation: With patient Time For Goal Achievement: 10/17/16 Potential to Achieve Goals: Good ADL Goals Pt Will Perform Grooming: with supervision;with set-up;standing Pt Will Perform Lower Body Bathing: with min assist;sitting/lateral leans;sit to/from stand Pt Will Perform Lower Body Dressing: with mod assist;sitting/lateral leans;sit to/from stand Pt Will Transfer to Toilet: with supervision;ambulating Pt Will Perform Toileting - Clothing Manipulation and hygiene: with min guard assist;with min assist;sit to/from stand Pt Will Perform Tub/Shower Transfer: with supervision;shower seat;3 in 1;ambulating;rolling Baba  OT Frequency: Min 2X/week   Barriers to D/C:    no barriers                     End of Session Equipment Utilized During Treatment: Gait belt;Rolling [place];Other (comment) (3 in 1) CPM Right Knee CPM Right Knee: On  Activity Tolerance: Patient limited by fatigue;Patient limited by pain Patient left: in bed;with call bell/phone within reach   Time: 0951-1019 OT Time Calculation (min): 28 min Charges:  OT General Charges $OT Visit: 1 Procedure OT Evaluation $OT Eval Moderate Complexity: 1 Procedure OT Treatments $Therapeutic Activity: 8-22 mins G-Codes:    Britt Bottom 10/10/2016, 2:55 PM

## 2016-10-11 MED ORDER — ASPIRIN 325 MG PO TBEC: 325.0000 mg | DELAYED_RELEASE_TABLET | Freq: Two times a day (BID) | ORAL | 0 refills | Status: DC

## 2016-10-11 MED ORDER — METHOCARBAMOL 500 MG PO TABS: 500.0000 mg | ORAL_TABLET | Freq: Four times a day (QID) | ORAL | 0 refills | Status: DC | PRN

## 2016-10-11 MED ORDER — HYDROCODONE-ACETAMINOPHEN 5-325 MG PO TABS: 1.0000 | ORAL_TABLET | ORAL | 0 refills | Status: DC | PRN

## 2016-10-11 NOTE — Progress Notes (Signed)
Occupational Therapy Treatment Patient Details Name: Maureen Keller MRN: [id number] DOB: 05-28-1946 Today's Date: 10/11/2016    History of present illness Patient is a 70 y/o female with hx of HTN, HLD, pulmonary HTN, mitral valve disorder, glaucoma, depression, and CKD presents s/p right TKA.   OT comments  Pt making good progress with functional goals. Scheduled to d/c home today  Follow Up Recommendations  Supervision - Intermittent    Equipment Recommendations  3 in 1 bedside comode;Other (comment) (ADL A/E kit)    Recommendations for Other Services      Precautions / Restrictions Precautions Precautions: Knee Precaution Booklet Issued: No Restrictions Weight Bearing Restrictions: Yes RLE Weight Bearing: Weight bearing as tolerated       Mobility Bed Mobility               General bed mobility comments: up in recliner  Transfers Overall transfer level: Needs assistance Equipment used: Rolling Rahmani (2 wheeled) Transfers: Sit to/from Stand Sit to Stand: Supervision         General transfer comment: VCs required for hand placement     Balance     Sitting balance-Leahy Scale: Good       Standing balance-Leahy Scale: Fair                     ADL       Grooming: Wash/dry hands;Wash/dry face;Supervision/safety;Oral care;Set up   Upper Body Bathing: Set up;Supervision/ safety;Standing   Lower Body Bathing: Minimal assistance   Upper Body Dressing : Set up;Supervision/safety;Standing   Lower Body Dressing: Minimal assistance;Moderate assistance   Toilet Transfer: RW;Ambulation;Comfort height toilet;Cueing for safety;Supervision/safety   Toileting- Clothing Manipulation and Hygiene: Min guard   Tub/ Shower Transfer: 3 in 1;Rolling [place];Ambulation;Supervision/safety   Functional mobility during ADLs: Supervision/safety        Vision  no change from baseline                              Cognition   Behavior  During Therapy: WFL for tasks assessed/performed Overall Cognitive Status: Within Functional Limits for tasks assessed                       Extremity/Trunk Assessment   WFL                        General Comments  pt pleasant and cooperative    Pertinent Vitals/ Pain       Pain Assessment: 0-10 Pain Score: 2  Pain Location: R knee Pain Descriptors / Indicators: Sore Pain Intervention(s): Monitored during session                                                          Frequency  Min 2X/week        Progress Toward Goals  OT Goals(current goals can now be found in the care plan section)  Progress towards OT goals: Progressing toward goals     Plan Discharge plan remains appropriate                     End of Session Equipment Utilized During Treatment: Rolling [place];Other (comment) (3 in 1)   Activity Tolerance Patient  tolerated treatment well   Patient Left with call bell/phone within reach;in chair             Time: 0927-1016 OT Time Calculation (min): 49 min  Charges: OT General Charges $OT Visit: 1 Procedure OT Treatments $Self Care/Home Management : 23-37 mins $Therapeutic Activity: 8-22 mins  Britt Bottom 10/11/2016, 12:20 PM

## 2016-10-11 NOTE — Discharge Summary (Signed)
Patient ID: Maureen Keller MRN: [id number] DOB/AGE: May 02, 1946 70 y.o.  Admit date: 10/09/2016 Discharge date: 10/11/2016  Admission Diagnoses:  Principal Problem:   Primary osteoarthritis of right knee   Discharge Diagnoses:  Same  Past Medical History:  Diagnosis Date  . Aortic stenosis 11/2015   Severe AS s/p Bioprosthetic AVR  . Arthritis   . Chronic kidney disease (CKD), stage III (moderate)   . Complication of anesthesia    awakened durimg procedure- heart cath 11/2015.  woke up during buninectomy  . Constipation   . Depression    PTSD  . GERD (gastroesophageal reflux disease)   . Glaucoma   . Hyperlipidemia   . Hypertension   . Hypothyroidism   . Mitral valve disorder    Mild to moderate MR by echo 03/2016  . Pulmonary hypertension    mild with PASP 7mmHg by echo 03/2016  . Shortness of breath dyspnea    noncardiac - most likely secondary to GERD    Surgeries: Procedure(s): TOTAL RIGHT KNEE ARTHROPLASTY on 10/09/2016   Consultants:   Discharged Condition: Improved  Hospital Course: Maureen Keller is an 70 y.o. female who was admitted 10/09/2016 for operative treatment ofPrimary osteoarthritis of right knee. Patient has severe unremitting pain that affects sleep, daily activities, and work/hobbies. After pre-op clearance the patient was taken to the operating room on 10/09/2016 and underwent  Procedure(s): TOTAL RIGHT KNEE ARTHROPLASTY.    Patient was given perioperative antibiotics: Anti-infectives    Start     Dose/Rate Route Frequency Ordered Stop   10/09/16 1330  ceFAZolin (ANCEF) IVPB 2g/100 mL premix     2 g 200 mL/hr over 30 Minutes Intravenous Every 6 hours 10/09/16 1309 10/09/16 2026   10/09/16 0537  ceFAZolin (ANCEF) IVPB 2g/100 mL premix  Status:  Discontinued     2 g 200 mL/hr over 30 Minutes Intravenous On call to O.R. 10/09/16 [id number] 10/09/16 0542   10/09/16 0526  ceFAZolin (ANCEF) IVPB 2g/100 mL premix     2 g 200 mL/hr over 30 Minutes  Intravenous On call to O.R. 10/09/16 0526 10/09/16 0800       Patient was given sequential compression devices, early ambulation, and chemoprophylaxis to prevent DVT.  Patient benefited maximally from hospital stay and there were no complications.    Recent vital signs: Patient Vitals for the past 24 hrs:  BP Temp Temp src Pulse Resp SpO2  10/11/16 0605 (!) 150/75 98.1 F (36.7 C) Oral 72 16 97 %  10/10/16 2016 (!) 155/58 98.8 F (37.1 C) Oral 68 16 97 %  10/10/16 1344 (!) 174/56 98.4 F (36.9 C) - (!) 58 16 97 %     Recent laboratory studies: No results for input(s): WBC, HGB, HCT, PLT, NA, K, CL, CO2, BUN, CREATININE, GLUCOSE, INR, CALCIUM in the last 72 hours.  Invalid input(s): PT, 2   Discharge Medications:     Medication List    TAKE these medications   aspirin 325 MG EC tablet Take 1 tablet (325 mg total) by mouth 2 (two) times daily after a meal. What changed:  medication strength  how much to take  when to take this   cholecalciferol 1000 units tablet Commonly known as:  VITAMIN D Take 5,000 Units by mouth daily.   COLACE 100 MG capsule Generic drug:  docusate sodium Take 200 mg by mouth daily.   furosemide 20 MG tablet Commonly known as:  LASIX Take 1 tablet (20 mg total) by mouth daily as  needed. Take daily as needed for shortness of breath What changed:  reasons to take this  additional instructions   HYDROcodone-acetaminophen 5-325 MG tablet Commonly known as:  NORCO/VICODIN Take 1-2 tablets by mouth every 4 (four) hours as needed (breakthrough pain).   lamoTRIgine 100 MG tablet Commonly known as:  LAMICTAL Take 100-200 mg by mouth 2 (two) times daily. 200mg  in the morning and 100mg  in the evening   latanoprost 0.005 % ophthalmic solution Commonly known as:  XALATAN Place 1 drop into both eyes at bedtime.   levothyroxine 137 MCG tablet Commonly known as:  SYNTHROID, LEVOTHROID Take 137 mcg by mouth daily before breakfast.   losartan  50 MG tablet Commonly known as:  COZAAR Take 1 tablet (50 mg total) by mouth daily.   methocarbamol 500 MG tablet Commonly known as:  ROBAXIN Take 1 tablet (500 mg total) by mouth every 6 (six) hours as needed for muscle spasms.   metoprolol succinate 25 MG 24 hr tablet Commonly known as:  TOPROL-XL Take 1 tablet (25 mg total) by mouth daily.   omeprazole 20 MG capsule Commonly known as:  PRILOSEC Take 20 mg by mouth every evening.   traZODone 100 MG tablet Commonly known as:  DESYREL Take 300 mg by mouth at bedtime.       Diagnostic Studies: Mr Jeri Cos [id number] Contrast  Result Date: 09/25/2016 CLINICAL DATA:  Recent visual changes suspected to represent possible glaucoma, no documented bitemporal hemianopsia in the electronic medical record. 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 Continued surveillance. EXAM: MRI HEAD WITHOUT AND WITH CONTRAST TECHNIQUE: Multiplanar, multiecho pulse sequences of the brain and surrounding structures were obtained without and with intravenous contrast. CONTRAST:  62mL MULTIHANCE GADOBENATE DIMEGLUMINE 529 MG/ML IV SOLN Creatinine was obtained on site at [facility] at [address] Results: Creatinine 0.9 mg/dL. COMPARISON:  08/26/2014.  11/17/2013. FINDINGS: Brain: Stable nodular intrasellar and suprasellar precontrast T1 hyperintense mass is consistent with multi-septated Rathke's cleft cyst. Intrasellar component measures 5 x 4 x 7 mm, unchanged from priors. Suprasellar component measures 7 x 9 x 6 mm, also unchanged. Mild mass effect on the undersurface of the chiasm, greater on the LEFT, reference image 6 series 9, unchanged. No detectable enhancement of the lesion, or evidence for microadenoma. The stalk is mildly displaced posteriorly, but enhances normally. Posterior pituitary bright spot is preserved. No acute stroke, acute hemorrhage, intra-axial mass lesion, or extra-axial fluid. Mild to moderate  atrophy with ventriculomegaly. Focal and confluent foci of T2 and FLAIR hyperintensity throughout the white matter consistent with mild to moderate small vessel disease. Vascular: Normal flow voids. No involvement of the cavernous sinus by the mass. Major dural venous sinuses are patent. Skull and upper cervical spine: Normal marrow signal. Sinuses/Orbits: Negative. Other: None. 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 Mild mass effect on the chiasm is stable from 2014 and 2015. Atrophy and small vessel disease. Similar appearance to priors. No acute Electronically Signed   By: Staci Righter M.D.   On: 09/25/2016 11:20   US Breast Ltd Uni Right Inc Axilla  Result Date: 09/21/2016 CLINICAL DATA:  Patient was called back from screening mammogram for a right breast mass. EXAM: 2D DIGITAL DIAGNOSTIC RIGHT MAMMOGRAM WITH ADJUNCT TOMO ULTRASOUND RIGHT BREAST COMPARISON:  Previous exam(s). ACR Breast Density Category b: There are scattered areas of fibroglandular density. FINDINGS: Additional imaging of the right breast was performed. There is persistence  of a well-circumscribed 4 mm nodule in the upper-inner quadrant of the right breast. On physical exam, I do not palpate a discrete mass in the upper-inner quadrant of the right breast. Targeted ultrasound is performed, showing a well-circumscribed near anechoic lesion in the right breast at 1 o'clock 4 cm from the nipple measuring 4 x 2 x 4 mm. Is thought to likely be a mildly complex cyst. IMPRESSION: Probable benign cyst in the right breast. RECOMMENDATION: Short-term interval followup right breast ultrasound in months is recommended. I have discussed the findings and recommendations with the patient. Results were also provided in writing at the conclusion of the visit. If applicable, a reminder letter will be sent to the patient regarding the next appointment. BI-RADS CATEGORY  3: Probably benign. Electronically Signed   By:  Lillia Mountain M.D.   On: 09/21/2016 12:14   Mm Diag Breast Tomo Uni Right  Result Date: 09/21/2016 CLINICAL DATA:  Patient was called back from screening mammogram for a right breast mass. EXAM: 2D DIGITAL DIAGNOSTIC RIGHT MAMMOGRAM WITH ADJUNCT TOMO ULTRASOUND RIGHT BREAST COMPARISON:  Previous exam(s). ACR Breast Density Category b: There are scattered areas of fibroglandular density. FINDINGS: Additional imaging of the right breast was performed. There is persistence of a well-circumscribed 4 mm nodule in the upper-inner quadrant of the right breast. On physical exam, I do not palpate a discrete mass in the upper-inner quadrant of the right breast. Targeted ultrasound is performed, showing a well-circumscribed near anechoic lesion in the right breast at 1 o'clock 4 cm from the nipple measuring 4 x 2 x 4 mm. Is thought to likely be a mildly complex cyst. IMPRESSION: Probable benign cyst in the right breast. RECOMMENDATION: Short-term interval followup right breast ultrasound in months is recommended. I have discussed the findings and recommendations with the patient. Results were also provided in writing at the conclusion of the visit. If applicable, a reminder letter will be sent to the patient regarding the next appointment. BI-RADS CATEGORY  3: Probably benign. Electronically Signed   By: Lillia Mountain M.D.   On: 09/21/2016 12:14   Mm Screening Breast Tomo Bilateral  Result Date: 09/17/2016 CLINICAL DATA:  Screening. EXAM: 2D DIGITAL SCREENING BILATERAL MAMMOGRAM WITH CAD AND ADJUNCT TOMO COMPARISON:  Previous exam(s). ACR Breast Density Category b: There are scattered areas of fibroglandular density. FINDINGS: In the right breast, a possible mass warrants further evaluation. In the left breast, no findings suspicious for malignancy. Images were processed with CAD. IMPRESSION: Further evaluation is suggested for possible mass in the right breast. RECOMMENDATION: Diagnostic mammogram and possibly ultrasound  of the right breast. (Code:[id number]) The patient will be contacted regarding the findings, and additional imaging will be scheduled. BI-RADS CATEGORY  0: Incomplete. Need additional imaging evaluation and/or prior mammograms for comparison. Electronically Signed   By: Nolon Nations M.D.   On: 09/17/2016 15:08    Disposition: 01-Home or Self Care  Discharge Instructions    Call MD / Call 911    Complete by:  As directed    If you experience chest pain or shortness of breath, CALL 911 and be transported to the hospital emergency room.  If you develope a fever above 101 F, pus (white drainage) or increased drainage or redness at the wound, or calf pain, call your surgeon's office.   Constipation Prevention    Complete by:  As directed    Drink plenty of fluids.  Prune juice may be helpful.  You may use a stool  softener, such as Colace (over the counter) 100 mg twice a day.  Use MiraLax (over the counter) for constipation as needed.   Diet - low sodium heart healthy    Complete by:  As directed    Discharge instructions    Complete by:  As directed    INSTRUCTIONS AFTER JOINT REPLACEMENT   Remove items at home which could result in a fall. This includes throw rugs or furniture in walking pathways ICE to the affected joint every three hours while awake for 30 minutes at a time, for at least the first 3-5 days, and then as needed for pain and swelling.  Continue to use ice for pain and swelling. You may notice swelling that will progress down to the foot and ankle.  This is normal after surgery.  Elevate your leg when you are not up walking on it.   Continue to use the breathing machine you got in the hospital (incentive spirometer) which will help keep your temperature down.  It is common for your temperature to cycle up and down following surgery, especially at night when you are not up moving around and exerting yourself.  The breathing machine keeps your lungs expanded and your temperature  down.   DIET:  As you were doing prior to hospitalization, we recommend a well-balanced diet.  DRESSING / WOUND CARE / SHOWERING  You may shower 3 days after surgery, but keep the wounds dry during showering.  You may use an occlusive plastic wrap (Press'n Seal for example), NO SOAKING/SUBMERGING IN THE BATHTUB.  If the bandage gets wet, change with a clean dry gauze.  If the incision gets wet, pat the wound dry with a clean towel.  ACTIVITY  Increase activity slowly as tolerated, but follow the weight bearing instructions below.   No driving for 6 weeks or until further direction given by your physician.  You cannot drive while taking narcotics.  No lifting or carrying greater than 10 lbs. until further directed by your surgeon. Avoid periods of inactivity such as sitting longer than an hour when not asleep. This helps prevent blood clots.  You may return to work once you are authorized by your doctor.     WEIGHT BEARING   Weight bearing as tolerated with assist device (Buttacavoli, cane, etc) as directed, use it as long as suggested by your surgeon or therapist, typically at least 4-6 weeks.   EXERCISES  Results after joint replacement surgery are often greatly improved when you follow the exercise, range of motion and muscle strengthening exercises prescribed by your doctor. Safety measures are also important to protect the joint from further injury. Any time any of these exercises cause you to have increased pain or swelling, decrease what you are doing until you are comfortable again and then slowly increase them. If you have problems or questions, call your caregiver or physical therapist for advice.   Rehabilitation is important following a joint replacement. After just a few days of immobilization, the muscles of the leg can become weakened and shrink (atrophy).  These exercises are designed to build up the tone and strength of the thigh and leg muscles and to improve motion. Often  times heat used for twenty to thirty minutes before working out will loosen up your tissues and help with improving the range of motion but do not use heat for the first two weeks following surgery (sometimes heat can increase post-operative swelling).   These exercises can be done on a training (  exercise) mat, on the floor, on a table or on a bed. Use whatever works the best and is most comfortable for you.    Use music or television while you are exercising so that the exercises are a pleasant break in your day. This will make your life better with the exercises acting as a break in your routine that you can look forward to.   Perform all exercises about fifteen times, three times per day or as directed.  You should exercise both the operative leg and the other leg as well.   Exercises include:   Quad Sets - Tighten up the muscle on the front of the thigh (Quad) and hold for 5-10 seconds.   Straight Leg Raises - With your knee straight (if you were given a brace, keep it on), lift the leg to 60 degrees, hold for 3 seconds, and slowly lower the leg.  Perform this exercise against resistance later as your leg gets stronger.  Leg Slides: Lying on your back, slowly slide your foot toward your buttocks, bending your knee up off the floor (only go as far as is comfortable). Then slowly slide your foot back down until your leg is flat on the floor again.  Angel Wings: Lying on your back spread your legs to the side as far apart as you can without causing discomfort.  Hamstring Strength:  Lying on your back, push your heel against the floor with your leg straight by tightening up the muscles of your buttocks.  Repeat, but this time bend your knee to a comfortable angle, and push your heel against the floor.  You may put a pillow under the heel to make it more comfortable if necessary.   A rehabilitation program following joint replacement surgery can speed recovery and prevent re-injury in the future due to  weakened muscles. Contact your doctor or a physical therapist for more information on knee rehabilitation.    CONSTIPATION  Constipation is defined medically as fewer than three stools per week and severe constipation as less than one stool per week.  Even if you have a regular bowel pattern at home, your normal regimen is likely to be disrupted due to multiple reasons following surgery.  Combination of anesthesia, postoperative narcotics, change in appetite and fluid intake all can affect your bowels.   YOU MUST use at least one of the following options; they are listed in order of increasing strength to get the job done.  They are all available over the counter, and you may need to use some, POSSIBLY even all of these options:    Drink plenty of fluids (prune juice may be helpful) and high fiber foods Colace 100 mg by mouth twice a day  Senokot for constipation as directed and as needed Dulcolax (bisacodyl), take with full glass of water  Miralax (polyethylene glycol) once or twice a day as needed.  If you have tried all these things and are unable to have a bowel movement in the first 3-4 days after surgery call either your surgeon or your primary doctor.    If you experience loose stools or diarrhea, hold the medications until you stool forms back up.  If your symptoms do not get better within 1 week or if they get worse, check with your doctor.  If you experience "the worst abdominal pain ever" or develop nausea or vomiting, please contact the office immediately for further recommendations for treatment.   ITCHING:  If you experience itching with your  medications, try taking only a single pain pill, or even half a pain pill at a time.  You can also use Benadryl over the counter for itching or also to help with sleep.   TED HOSE STOCKINGS:  Use stockings on both legs until for at least 2 weeks or as directed by physician office. They may be removed at night for sleeping.  MEDICATIONS:  See  your medication summary on the "After Visit Summary" that nursing will review with you.  You may have some home medications which will be placed on hold until you complete the course of blood thinner medication.  It is important for you to complete the blood thinner medication as prescribed.  PRECAUTIONS:  If you experience chest pain or shortness of breath - call 911 immediately for transfer to the hospital emergency department.   If you develop a fever greater that 101 F, purulent drainage from wound, increased redness or drainage from wound, foul odor from the wound/dressing, or calf pain - CONTACT YOUR SURGEON.                                                   FOLLOW-UP APPOINTMENTS:  If you do not already have a post-op appointment, please call the office for an appointment to be seen by your surgeon.  Guidelines for how soon to be seen are listed in your "After Visit Summary", but are typically between 1-4 weeks after surgery.  OTHER INSTRUCTIONS:   Knee Replacement:  Do not place pillow under knee, focus on keeping the knee straight while resting. CPM instructions: 0-90 degrees, 2 hours in the morning, 2 hours in the afternoon, and 2 hours in the evening. Place foam block, curve side up under heel at all times except when in CPM or when walking.  DO NOT modify, tear, cut, or change the foam block in any way.  MAKE SURE YOU:  Understand these instructions.  Get help right away if you are not doing well or get worse.    Thank you for letting us be a part of your medical care team.  It is a privilege we respect greatly.  We hope these instructions will help you stay on track for a fast and full recovery!   Increase activity slowly as tolerated    Complete by:  As directed       Follow-up Information    DALLDORF,PETER G, MD. Schedule an appointment as soon as possible for a visit in 2 weeks.   Specialty:  Orthopedic Surgery Contact information: [place]  [id number] [phone]            Signed: Rich Fuchs 10/11/2016, 10:58 AM

## 2016-10-11 NOTE — Progress Notes (Signed)
Discharge papers and prescriptions reviewed with patient. Patient states understanding. IV removed. Patient has transportation at bedside [place], Therapist, sports

## 2016-10-12 DIAGNOSIS — I350 Nonrheumatic aortic (valve) stenosis: Secondary | ICD-10-CM | POA: Diagnosis not present

## 2016-10-12 DIAGNOSIS — K219 Gastro-esophageal reflux disease without esophagitis: Secondary | ICD-10-CM | POA: Diagnosis not present

## 2016-10-12 DIAGNOSIS — Z471 Aftercare following joint replacement surgery: Secondary | ICD-10-CM | POA: Diagnosis not present

## 2016-10-12 DIAGNOSIS — E039 Hypothyroidism, unspecified: Secondary | ICD-10-CM | POA: Diagnosis not present

## 2016-10-12 DIAGNOSIS — F329 Major depressive disorder, single episode, unspecified: Secondary | ICD-10-CM | POA: Diagnosis not present

## 2016-10-12 DIAGNOSIS — I272 Pulmonary hypertension, unspecified: Secondary | ICD-10-CM | POA: Diagnosis not present

## 2016-10-12 DIAGNOSIS — N183 Chronic kidney disease, stage 3 (moderate): Secondary | ICD-10-CM | POA: Diagnosis not present

## 2016-10-12 DIAGNOSIS — E785 Hyperlipidemia, unspecified: Secondary | ICD-10-CM | POA: Diagnosis not present

## 2016-10-12 DIAGNOSIS — I129 Hypertensive chronic kidney disease with stage 1 through stage 4 chronic kidney disease, or unspecified chronic kidney disease: Secondary | ICD-10-CM | POA: Diagnosis not present

## 2016-10-12 DIAGNOSIS — Z96651 Presence of right artificial knee joint: Secondary | ICD-10-CM | POA: Diagnosis not present

## 2016-10-12 DIAGNOSIS — Z952 Presence of prosthetic heart valve: Secondary | ICD-10-CM | POA: Diagnosis not present

## 2016-10-12 DIAGNOSIS — Z7982 Long term (current) use of aspirin: Secondary | ICD-10-CM | POA: Diagnosis not present

## 2016-10-15 DIAGNOSIS — I272 Pulmonary hypertension, unspecified: Secondary | ICD-10-CM | POA: Diagnosis not present

## 2016-10-15 DIAGNOSIS — N183 Chronic kidney disease, stage 3 (moderate): Secondary | ICD-10-CM | POA: Diagnosis not present

## 2016-10-15 DIAGNOSIS — I350 Nonrheumatic aortic (valve) stenosis: Secondary | ICD-10-CM | POA: Diagnosis not present

## 2016-10-15 DIAGNOSIS — Z471 Aftercare following joint replacement surgery: Secondary | ICD-10-CM | POA: Diagnosis not present

## 2016-10-15 DIAGNOSIS — I129 Hypertensive chronic kidney disease with stage 1 through stage 4 chronic kidney disease, or unspecified chronic kidney disease: Secondary | ICD-10-CM | POA: Diagnosis not present

## 2016-10-15 DIAGNOSIS — Z96651 Presence of right artificial knee joint: Secondary | ICD-10-CM | POA: Diagnosis not present

## 2016-10-17 DIAGNOSIS — I129 Hypertensive chronic kidney disease with stage 1 through stage 4 chronic kidney disease, or unspecified chronic kidney disease: Secondary | ICD-10-CM | POA: Diagnosis not present

## 2016-10-17 DIAGNOSIS — I272 Pulmonary hypertension, unspecified: Secondary | ICD-10-CM | POA: Diagnosis not present

## 2016-10-17 DIAGNOSIS — I350 Nonrheumatic aortic (valve) stenosis: Secondary | ICD-10-CM | POA: Diagnosis not present

## 2016-10-17 DIAGNOSIS — Z471 Aftercare following joint replacement surgery: Secondary | ICD-10-CM | POA: Diagnosis not present

## 2016-10-17 DIAGNOSIS — N183 Chronic kidney disease, stage 3 (moderate): Secondary | ICD-10-CM | POA: Diagnosis not present

## 2016-10-17 DIAGNOSIS — Z96651 Presence of right artificial knee joint: Secondary | ICD-10-CM | POA: Diagnosis not present

## 2016-10-18 DIAGNOSIS — Z96651 Presence of right artificial knee joint: Secondary | ICD-10-CM | POA: Diagnosis not present

## 2016-10-18 DIAGNOSIS — I272 Pulmonary hypertension, unspecified: Secondary | ICD-10-CM | POA: Diagnosis not present

## 2016-10-18 DIAGNOSIS — Z471 Aftercare following joint replacement surgery: Secondary | ICD-10-CM | POA: Diagnosis not present

## 2016-10-18 DIAGNOSIS — I350 Nonrheumatic aortic (valve) stenosis: Secondary | ICD-10-CM | POA: Diagnosis not present

## 2016-10-18 DIAGNOSIS — I129 Hypertensive chronic kidney disease with stage 1 through stage 4 chronic kidney disease, or unspecified chronic kidney disease: Secondary | ICD-10-CM | POA: Diagnosis not present

## 2016-10-18 DIAGNOSIS — N183 Chronic kidney disease, stage 3 (moderate): Secondary | ICD-10-CM | POA: Diagnosis not present

## 2016-10-19 DIAGNOSIS — R262 Difficulty in walking, not elsewhere classified: Secondary | ICD-10-CM | POA: Diagnosis not present

## 2016-10-19 DIAGNOSIS — Z471 Aftercare following joint replacement surgery: Secondary | ICD-10-CM | POA: Diagnosis not present

## 2016-10-19 DIAGNOSIS — Z96651 Presence of right artificial knee joint: Secondary | ICD-10-CM | POA: Diagnosis not present

## 2016-10-19 DIAGNOSIS — M25661 Stiffness of right knee, not elsewhere classified: Secondary | ICD-10-CM | POA: Diagnosis not present

## 2016-10-19 DIAGNOSIS — M1711 Unilateral primary osteoarthritis, right knee: Secondary | ICD-10-CM | POA: Diagnosis not present

## 2016-10-19 DIAGNOSIS — M25561 Pain in right knee: Secondary | ICD-10-CM | POA: Diagnosis not present

## 2016-10-24 DIAGNOSIS — M25561 Pain in right knee: Secondary | ICD-10-CM | POA: Diagnosis not present

## 2016-10-24 DIAGNOSIS — Z96651 Presence of right artificial knee joint: Secondary | ICD-10-CM | POA: Diagnosis not present

## 2016-10-24 DIAGNOSIS — M25661 Stiffness of right knee, not elsewhere classified: Secondary | ICD-10-CM | POA: Diagnosis not present

## 2016-10-24 DIAGNOSIS — R262 Difficulty in walking, not elsewhere classified: Secondary | ICD-10-CM | POA: Diagnosis not present

## 2016-10-26 DIAGNOSIS — M25561 Pain in right knee: Secondary | ICD-10-CM | POA: Diagnosis not present

## 2016-10-26 DIAGNOSIS — M25661 Stiffness of right knee, not elsewhere classified: Secondary | ICD-10-CM | POA: Diagnosis not present

## 2016-10-26 DIAGNOSIS — Z96651 Presence of right artificial knee joint: Secondary | ICD-10-CM | POA: Diagnosis not present

## 2016-10-26 DIAGNOSIS — R262 Difficulty in walking, not elsewhere classified: Secondary | ICD-10-CM | POA: Diagnosis not present

## 2016-10-29 DIAGNOSIS — R262 Difficulty in walking, not elsewhere classified: Secondary | ICD-10-CM | POA: Diagnosis not present

## 2016-10-29 DIAGNOSIS — M25661 Stiffness of right knee, not elsewhere classified: Secondary | ICD-10-CM | POA: Diagnosis not present

## 2016-10-29 DIAGNOSIS — M25561 Pain in right knee: Secondary | ICD-10-CM | POA: Diagnosis not present

## 2016-10-29 DIAGNOSIS — Z96651 Presence of right artificial knee joint: Secondary | ICD-10-CM | POA: Diagnosis not present

## 2016-11-02 DIAGNOSIS — M25661 Stiffness of right knee, not elsewhere classified: Secondary | ICD-10-CM | POA: Diagnosis not present

## 2016-11-02 DIAGNOSIS — R262 Difficulty in walking, not elsewhere classified: Secondary | ICD-10-CM | POA: Diagnosis not present

## 2016-11-02 DIAGNOSIS — M25561 Pain in right knee: Secondary | ICD-10-CM | POA: Diagnosis not present

## 2016-11-02 DIAGNOSIS — Z96651 Presence of right artificial knee joint: Secondary | ICD-10-CM | POA: Diagnosis not present

## 2016-11-05 DIAGNOSIS — M1712 Unilateral primary osteoarthritis, left knee: Secondary | ICD-10-CM | POA: Diagnosis not present

## 2016-11-05 DIAGNOSIS — M25561 Pain in right knee: Secondary | ICD-10-CM | POA: Diagnosis not present

## 2016-11-06 DIAGNOSIS — M65321 Trigger finger, right index finger: Secondary | ICD-10-CM | POA: Diagnosis not present

## 2016-11-06 DIAGNOSIS — R262 Difficulty in walking, not elsewhere classified: Secondary | ICD-10-CM | POA: Diagnosis not present

## 2016-11-06 DIAGNOSIS — M25661 Stiffness of right knee, not elsewhere classified: Secondary | ICD-10-CM | POA: Diagnosis not present

## 2016-11-06 DIAGNOSIS — Z96651 Presence of right artificial knee joint: Secondary | ICD-10-CM | POA: Diagnosis not present

## 2016-11-06 DIAGNOSIS — M25561 Pain in right knee: Secondary | ICD-10-CM | POA: Diagnosis not present

## 2016-11-06 DIAGNOSIS — G5602 Carpal tunnel syndrome, left upper limb: Secondary | ICD-10-CM | POA: Diagnosis not present

## 2016-11-12 DIAGNOSIS — Z96651 Presence of right artificial knee joint: Secondary | ICD-10-CM | POA: Diagnosis not present

## 2016-11-12 DIAGNOSIS — M25661 Stiffness of right knee, not elsewhere classified: Secondary | ICD-10-CM | POA: Diagnosis not present

## 2016-11-12 DIAGNOSIS — R262 Difficulty in walking, not elsewhere classified: Secondary | ICD-10-CM | POA: Diagnosis not present

## 2016-11-12 DIAGNOSIS — M25561 Pain in right knee: Secondary | ICD-10-CM | POA: Diagnosis not present

## 2016-11-15 DIAGNOSIS — Z96651 Presence of right artificial knee joint: Secondary | ICD-10-CM | POA: Diagnosis not present

## 2016-11-15 DIAGNOSIS — M25561 Pain in right knee: Secondary | ICD-10-CM | POA: Diagnosis not present

## 2016-11-15 DIAGNOSIS — M25661 Stiffness of right knee, not elsewhere classified: Secondary | ICD-10-CM | POA: Diagnosis not present

## 2016-11-15 DIAGNOSIS — R262 Difficulty in walking, not elsewhere classified: Secondary | ICD-10-CM | POA: Diagnosis not present

## 2016-11-21 DIAGNOSIS — M25562 Pain in left knee: Secondary | ICD-10-CM | POA: Diagnosis not present

## 2016-11-22 DIAGNOSIS — R262 Difficulty in walking, not elsewhere classified: Secondary | ICD-10-CM | POA: Diagnosis not present

## 2016-11-22 DIAGNOSIS — Z96651 Presence of right artificial knee joint: Secondary | ICD-10-CM | POA: Diagnosis not present

## 2016-11-22 DIAGNOSIS — M25561 Pain in right knee: Secondary | ICD-10-CM | POA: Diagnosis not present

## 2016-11-22 DIAGNOSIS — M25661 Stiffness of right knee, not elsewhere classified: Secondary | ICD-10-CM | POA: Diagnosis not present

## 2016-11-26 DIAGNOSIS — M25661 Stiffness of right knee, not elsewhere classified: Secondary | ICD-10-CM | POA: Diagnosis not present

## 2016-11-26 DIAGNOSIS — M25561 Pain in right knee: Secondary | ICD-10-CM | POA: Diagnosis not present

## 2016-11-26 DIAGNOSIS — Z96651 Presence of right artificial knee joint: Secondary | ICD-10-CM | POA: Diagnosis not present

## 2016-11-26 DIAGNOSIS — R262 Difficulty in walking, not elsewhere classified: Secondary | ICD-10-CM | POA: Diagnosis not present

## 2016-11-28 DIAGNOSIS — M25661 Stiffness of right knee, not elsewhere classified: Secondary | ICD-10-CM | POA: Diagnosis not present

## 2016-11-28 DIAGNOSIS — Z96651 Presence of right artificial knee joint: Secondary | ICD-10-CM | POA: Diagnosis not present

## 2016-11-28 DIAGNOSIS — R262 Difficulty in walking, not elsewhere classified: Secondary | ICD-10-CM | POA: Diagnosis not present

## 2016-11-28 DIAGNOSIS — M25561 Pain in right knee: Secondary | ICD-10-CM | POA: Diagnosis not present

## 2016-12-03 ENCOUNTER — Telehealth: Payer: Self-pay | Admitting: Cardiology

## 2016-12-03 NOTE — Telephone Encounter (Signed)
To  [facility] for medication clarification question.

## 2016-12-03 NOTE — Telephone Encounter (Signed)
New message   Pt verbalized that Dr. Rhona Raider put her on maloxicam 15mg  for 1x day and she want to know if it is okay to take this medication

## 2016-12-04 NOTE — Telephone Encounter (Signed)
Spoke to patient and explained that meloxicam should be used as sparingly as possible when needed for pain associated with knee. She reports she does not require every day and this should only last until February when procedure scheduled.   She also states that she has had some SOB recently and believes this may be related to her heartburn. Discussed foods to avoid to help with heartburn and instructed that if she felt worsening she would need to make appt with Dr. Radford Pax or be evaluated by ER. She states understanding and appreciation for help.

## 2016-12-05 DIAGNOSIS — M25661 Stiffness of right knee, not elsewhere classified: Secondary | ICD-10-CM | POA: Diagnosis not present

## 2016-12-05 DIAGNOSIS — R262 Difficulty in walking, not elsewhere classified: Secondary | ICD-10-CM | POA: Diagnosis not present

## 2016-12-05 DIAGNOSIS — Z96651 Presence of right artificial knee joint: Secondary | ICD-10-CM | POA: Diagnosis not present

## 2016-12-05 DIAGNOSIS — M25561 Pain in right knee: Secondary | ICD-10-CM | POA: Diagnosis not present

## 2016-12-19 DIAGNOSIS — M25661 Stiffness of right knee, not elsewhere classified: Secondary | ICD-10-CM | POA: Diagnosis not present

## 2016-12-19 DIAGNOSIS — R262 Difficulty in walking, not elsewhere classified: Secondary | ICD-10-CM | POA: Diagnosis not present

## 2016-12-19 DIAGNOSIS — M25561 Pain in right knee: Secondary | ICD-10-CM | POA: Diagnosis not present

## 2016-12-19 DIAGNOSIS — Z96651 Presence of right artificial knee joint: Secondary | ICD-10-CM | POA: Diagnosis not present

## 2016-12-26 DIAGNOSIS — M25561 Pain in right knee: Secondary | ICD-10-CM | POA: Diagnosis not present

## 2016-12-26 DIAGNOSIS — M25661 Stiffness of right knee, not elsewhere classified: Secondary | ICD-10-CM | POA: Diagnosis not present

## 2016-12-26 DIAGNOSIS — R262 Difficulty in walking, not elsewhere classified: Secondary | ICD-10-CM | POA: Diagnosis not present

## 2016-12-26 DIAGNOSIS — Z96651 Presence of right artificial knee joint: Secondary | ICD-10-CM | POA: Diagnosis not present

## 2016-12-28 DIAGNOSIS — B9789 Other viral agents as the cause of diseases classified elsewhere: Secondary | ICD-10-CM | POA: Diagnosis not present

## 2016-12-28 DIAGNOSIS — J019 Acute sinusitis, unspecified: Secondary | ICD-10-CM | POA: Diagnosis not present

## 2016-12-28 DIAGNOSIS — J9801 Acute bronchospasm: Secondary | ICD-10-CM | POA: Diagnosis not present

## 2017-01-04 DIAGNOSIS — H401231 Low-tension glaucoma, bilateral, mild stage: Secondary | ICD-10-CM | POA: Diagnosis not present

## 2017-01-04 DIAGNOSIS — H5213 Myopia, bilateral: Secondary | ICD-10-CM | POA: Diagnosis not present

## 2017-01-10 ENCOUNTER — Other Ambulatory Visit: Payer: Self-pay | Admitting: Orthopaedic Surgery

## 2017-01-14 DIAGNOSIS — M25561 Pain in right knee: Secondary | ICD-10-CM | POA: Diagnosis not present

## 2017-01-21 ENCOUNTER — Encounter (HOSPITAL_COMMUNITY): Payer: Self-pay

## 2017-01-21 ENCOUNTER — Inpatient Hospital Stay (HOSPITAL_COMMUNITY): Admission: RE | Admit: 2017-01-21 | Payer: Medicare Other | Source: Ambulatory Visit

## 2017-01-21 ENCOUNTER — Encounter (HOSPITAL_COMMUNITY)
Admission: RE | Admit: 2017-01-21 | Discharge: 2017-01-21 | Disposition: A | Discharge status: Home / Self Care | Payer: Medicare Other | Source: Ambulatory Visit | Attending: Orthopaedic Surgery | Admitting: Orthopaedic Surgery

## 2017-01-21 DIAGNOSIS — Z01812 Encounter for preprocedural laboratory examination: Secondary | ICD-10-CM | POA: Insufficient documentation

## 2017-01-21 DIAGNOSIS — Z01810 Encounter for preprocedural cardiovascular examination: Secondary | ICD-10-CM | POA: Insufficient documentation

## 2017-01-21 DIAGNOSIS — M1712 Unilateral primary osteoarthritis, left knee: Secondary | ICD-10-CM | POA: Diagnosis not present

## 2017-01-21 HISTORY — DX: Dyspnea, unspecified: R06.00

## 2017-01-21 LAB — URINALYSIS, ROUTINE W REFLEX MICROSCOPIC
Bilirubin Urine: NEGATIVE
GLUCOSE, UA: NEGATIVE mg/dL
HGB URINE DIPSTICK: NEGATIVE
KETONES UR: 5 mg/dL — AB
NITRITE: NEGATIVE
PH: 6 (ref 5.0–8.0)
Protein, ur: NEGATIVE mg/dL
Specific Gravity, Urine: 1.031 — ABNORMAL HIGH (ref 1.005–1.030)

## 2017-01-21 LAB — CBC WITH DIFFERENTIAL/PLATELET
BASOS ABS: 0.1 10*3/uL (ref 0.0–0.1)
Basophils Relative: 1 %
EOS PCT: 6 %
Eosinophils Absolute: 0.4 10*3/uL (ref 0.0–0.7)
HCT: 42.8 % (ref 36.0–46.0)
Hemoglobin: 14.8 g/dL (ref 12.0–15.0)
LYMPHS ABS: 2.5 10*3/uL (ref 0.7–4.0)
Lymphocytes Relative: 40 %
MCH: 32 pg (ref 26.0–34.0)
MCHC: 34.6 g/dL (ref 30.0–36.0)
MCV: 92.6 fL (ref 78.0–100.0)
MONO ABS: 0.7 10*3/uL (ref 0.1–1.0)
MONOS PCT: 10 %
NEUTROS ABS: 2.7 10*3/uL (ref 1.7–7.7)
Neutrophils Relative %: 43 %
PLATELETS: 230 10*3/uL (ref 150–400)
RBC: 4.62 MIL/uL (ref 3.87–5.11)
RDW: 12.9 % (ref 11.5–15.5)
WBC: 6.3 10*3/uL (ref 4.0–10.5)

## 2017-01-21 LAB — SURGICAL PCR SCREEN
MRSA, PCR: NEGATIVE
Staphylococcus aureus: NEGATIVE

## 2017-01-21 LAB — BASIC METABOLIC PANEL
ANION GAP: 9 (ref 5–15)
BUN: 14 mg/dL (ref 6–20)
CO2: 24 mmol/L (ref 22–32)
Calcium: 9.6 mg/dL (ref 8.9–10.3)
Chloride: 106 mmol/L (ref 101–111)
Creatinine, Ser: 0.89 mg/dL (ref 0.44–1.00)
GFR calc Af Amer: >60 mL/min (ref >60)
GFR calc non Af Amer: >60 mL/min (ref >60)
GLUCOSE: 90 mg/dL (ref 65–99)
POTASSIUM: 4.3 mmol/L (ref 3.5–5.1)
Sodium: 139 mmol/L (ref 135–145)

## 2017-01-21 LAB — PROTIME-INR
INR: 0.96
Prothrombin Time: 12.8 seconds (ref 11.4–15.2)

## 2017-01-21 LAB — APTT: APTT: 27 s (ref 24–36)

## 2017-01-21 NOTE — Progress Notes (Signed)
Maureen Keller WAS CALLED RE: WAKING UP DURING FOOT SURGERY.  PATIENT HAS HAD MULTIPLE SURGERIES UNDER GEN ANESTHESIA SINCE AND DONE FINE.  Maureen Keller WILL REVIEW CHART.  SPOKE WITH JESSE AT DR. DALLDORF'S OFFICE RE: ASPIRIN AND HE STATED PATIENT SHOULD CONTINUE TAKING.  JESSE WILL CALL PATIENT IF SHE NEEDS TO INCREASE DOSE.   CARDIOLOGIST IS DR. Tressia Miners TURNER AND HAD CLEARED PATIENT FOR OTHER KNEE REPLACEMENT.

## 2017-01-21 NOTE — Pre-Procedure Instructions (Signed)
Maureen Keller  01/21/2017      [address], [place] [address] Phone: [phone] Fax: [phone]    Your procedure is scheduled on Tuesday, February 13th   Report to [facility] Admitting at 11:30 AM             (posted surgery time 1:36 pm - 3:51 pm)   Call this number if you have problems the MORNING of surgery:  [phone]. Short Stay Center is NOT open on weekends.   Remember:  Do not eat food or drink liquids after midnight Monday.   Take these medicines the morning of surgery with A SIP OF WATER : Hydrocodone, Levothyroxine, Metoprolol.              4 - 5 days prior to surgery, STOP taking any Vitamins, Herbal Supplements, Anti-inflammatories.   Do not wear jewelry, make-up or nail polish.  Do not wear lotions, powders,  perfumes, or deoderant.  Do not shave underarms and legs 48 hours prior to surgery.    Do not bring valuables to the hospital.  [facility] is not responsible for any belongings or valuables.  Contacts, dentures or bridgework may not be worn into surgery.  Leave your suitcase in the car.  After surgery it may be brought to your room.  For patients admitted to the hospital, discharge time will be determined by your treatment team.   Please read over the following fact sheets that you were given. Pain Booklet, MRSA Information and Surgical Site Infection Prevention, Preparing for Surgery.

## 2017-01-22 LAB — TYPE AND SCREEN
ABO/RH(D): AB POS
Antibody Screen: NEGATIVE

## 2017-01-22 NOTE — Progress Notes (Signed)
Anesthesia Chart Review:   Pt is a 71 -year-old female scheduled for L total knee arthroplasty in 01/29/2017 with Maureen Keller, M.D.  - Cardiologist is Maureen Him, MD, last office visit 09/10/16.  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 Former smoker. BMI 33. S/p R TKA 10/09/16.   Anesthesia history includes: Pt reports awakening during cardiac cath 2016 and during bunionectomy.  Medications include: Albuterol, ASA 81 mg, Lasix, Lamictal, levothyroxine, losartan, metoprolol, Prilosec  Preoperative labs reviewed  CXR 04/01/16: 1. No active cardiopulmonary disease. 2. Sequela of prior valve replacement.  EKG 04/01/16: NSR  Echo 04/11/16:  - Left ventricle: The cavity size was normal. Wall thickness was normal. Systolic function was normal. The estimated ejection fraction was in the range of 60% to 65%. Wall motion was normal; there were no regional wall motion abnormalities. Features are consistent with a pseudonormal left ventricular filling pattern, with concomitant abnormal relaxation and increased filling pressure (grade 2 diastolic dysfunction). - Aortic valve: A bioprosthesis was present and functioning normally. Mean gradient (S): 19 mm Hg. Peak gradient (S): 35 mm Hg. - Mitral valve: There was mild to moderate regurgitation directed centrally. - Left atrium: The atrium was mildly dilated. - Tricuspid valve: There was mild-moderate regurgitation directed centrally. - Pulmonary arteries: Systolic pressure was mildly increased. PA peak pressure: 36 mm Hg (S).  11/30/15 Cardiac cath (PRE-AVR): 1. No angiographic evidence of CAD 2. Severe aortic valve stenosis by echo (Cath data with peak to peak gradient 14 mm Hg, mean gradient 13.7 mm Hg, AVA 1.03 cm2).  3. Mild elevation filling  pressures.   12/15/15 Carotid U/S: Summary: - Bilateral - 1% to 39% ICA stenosis. Vertebral artery flow is antegrade.  12/15/15 PFTs: FVC 3.22 (107%), FEV1 2.62 (115%), DLCO unc 16.70 (68%).  Pt tolerated R TKA 4 months ago without issue. If no changes, I anticipate pt can proceed with surgery as scheduled.   Maureen Cass, FNP-BC [facility]/Anesthesiology Phone: [phone] 01/22/2017 3:47 PM

## 2017-01-23 NOTE — H&P (Signed)
TOTAL KNEE ADMISSION H&P  Patient is being admitted for left total knee arthroplasty.  Subjective:  Chief Complaint:left knee pain.  HPI: Maureen Keller, 71 y.o. female, has a history of pain and functional disability in the left knee due to arthritis and has failed non-surgical conservative treatments for greater than 12 weeks to includeNSAID's and/or analgesics, corticosteriod injections, flexibility and strengthening excercises, supervised PT with diminished ADL's post treatment, use of assistive devices, weight reduction as appropriate and activity modification.  Onset of symptoms was gradual, starting 5 years ago with gradually worsening course since that time. The patient noted no past surgery on the left knee(s).  Patient currently rates pain in the left knee(s) at 10 out of 10 with activity. Patient has night pain, worsening of pain with activity and weight bearing, pain that interferes with activities of daily living, crepitus and joint swelling.  Patient has evidence of subchondral cysts, subchondral sclerosis, periarticular osteophytes and joint space narrowing by imaging studies. There is no active infection.  Patient Active Problem List   Diagnosis Date Noted  . Primary osteoarthritis of right knee 10/09/2016  . GERD (gastroesophageal reflux disease) 05/10/2016  . Pulmonary HTN 05/10/2016  . Encounter for therapeutic drug monitoring 12/23/2015  . S/P AVR (aortic valve replacement) 12/16/2015  . DOE (dyspnea on exertion) 11/07/2015  . Chest pain 05/12/2015  . 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 Aortic stenosis   . Mitral valve disorder   . AR (aortic regurgitation)   . Hypertension    Past Medical History:  Diagnosis Date  . Aortic stenosis 11/2015   Severe AS s/p Bioprosthetic AVR  . Arthritis   . Chronic kidney disease (CKD), stage III (moderate)   . Complication of anesthesia    awakened durimg procedure- heart cath 11/2015.  woke up during buninectomy  .  Constipation   . Depression    PTSD  . Dyspnea   . GERD (gastroesophageal reflux disease)   . Glaucoma   . Hyperlipidemia   . Hypertension   . Hypothyroidism   . Mitral valve disorder    Mild to moderate MR by echo 03/2016  . Pulmonary hypertension    mild with PASP 33mmHg by echo 03/2016    Past Surgical History:  Procedure Laterality Date  . AORTIC VALVE REPLACEMENT N/A 12/16/2015   Procedure: AORTIC VALVE REPLACEMENT (AVR);  Surgeon: Melrose Nakayama, MD;  Location: [place];  Service: Open Heart Surgery;  Laterality: N/A;  Edwards Intuity Elite Aortic Valve 71mm  . BUNIONECTOMY Right   . CARDIAC CATHETERIZATION  11/04/2009   normal coronaries  . CARDIAC CATHETERIZATION N/A 11/30/2015   Procedure: Right/Left Heart Cath and Coronary Angiography;  Surgeon: Burnell Blanks, MD;  Location: [place] [facility];  Service: Cardiovascular;  Laterality: N/A;  . CARPAL TUNNEL RELEASE Right 1982  . CARPAL TUNNEL RELEASE Left   . CATARACT EXTRACTION Bilateral   . COLONOSCOPY    . TEE WITHOUT CARDIOVERSION N/A 12/16/2015   Procedure: TRANSESOPHAGEAL ECHOCARDIOGRAM (TEE);  Surgeon: Melrose Nakayama, MD;  Location: [place];  Service: Open Heart Surgery;  Laterality: N/A;  . TOE SURGERY Right    2 nd toe  . TOTAL KNEE ARTHROPLASTY Right 10/09/2016   Procedure: TOTAL RIGHT KNEE ARTHROPLASTY;  Surgeon: Melrose Nakayama, MD;  Location: [place];  Service: Orthopedics;  Laterality: Right;  . TRIGGER FINGER RELEASE Bilateral    thumbs - 2 separtate surgeries    No prescriptions prior to admission.   No Known Allergies  Social History  Substance Use Topics  . Smoking status: Former Smoker    Years: 12.00    Quit date: 10/29/1976  . Smokeless tobacco: Never Used  . Alcohol use Yes     Comment: Occ    Family History  Problem Relation Age of Onset  . Diabetes Mother   . Suicidality Mother 55  . Hypertension Father   . Heart disease Father   . Diabetes Father      Review of  Systems  Musculoskeletal: Positive for joint pain.       Left knee  All other systems reviewed and are negative.   Objective:  Physical Exam  Constitutional: She is oriented to person, place, and time. She appears well-developed and well-nourished.  HENT:  Head: Normocephalic and atraumatic.  Eyes: Pupils are equal, round, and reactive to light.  Neck: Normal range of motion.  Cardiovascular: Normal rate and regular rhythm.   Respiratory: Effort normal.  GI: Soft.  Musculoskeletal:  Examination of the right knee shows range of motion from 0-115.  No effusion.  Her knee is stable to varus and valgus stressing.  Her calf is soft and nontender.  She is neurovascularly intact distally.  Examination left knee shows range of motion from 0-120.  No effusion.  Some tenderness to palpation along her medial joint line.  Normal sensation and motor function throughout.  She is neurovascularly intact distally.    Neurological: She is alert and oriented to person, place, and time.  Skin: Skin is warm and dry.  Psychiatric: She has a normal mood and affect. Her behavior is normal. Judgment and thought content normal.    Vital signs in last 24 hours:    Labs:   Estimated body mass index is 33.3 kg/m as calculated from the following:   Height as of 01/21/17: 5\' 4"  (1.626 m).   Weight as of 01/21/17: 88 kg (194 lb).   Imaging Review Plain radiographs demonstrate severe degenerative joint disease of the left knee(s). The overall alignment isneutral. The bone quality appears to be good for age and reported activity level.  Assessment/Plan:  End stage primary arthritis, left knee   The patient history, physical examination, clinical judgment of the provider and imaging studies are consistent with end stage degenerative joint disease of the left knee(s) and total knee arthroplasty is deemed medically necessary. The treatment options including medical management, injection therapy arthroscopy and  arthroplasty were discussed at length. The risks and benefits of total knee arthroplasty were presented and reviewed. The risks due to aseptic loosening, infection, stiffness, patella tracking problems, thromboembolic complications and other imponderables were discussed. The patient acknowledged the explanation, agreed to proceed with the plan and consent was signed. Patient is being admitted for inpatient treatment for surgery, pain control, PT, OT, prophylactic antibiotics, VTE prophylaxis, progressive ambulation and ADL's and discharge planning. The patient is planning to be discharged home with home health services

## 2017-01-25 DIAGNOSIS — F3342 Major depressive disorder, recurrent, in full remission: Secondary | ICD-10-CM | POA: Diagnosis not present

## 2017-01-29 ENCOUNTER — Inpatient Hospital Stay (HOSPITAL_COMMUNITY)
Admission: RE | Admit: 2017-01-29 | Discharge: 2017-01-31 | DRG: 470 | Disposition: A | Discharge status: Home Health | Payer: Medicare Other | Source: Ambulatory Visit | Attending: Orthopaedic Surgery | Admitting: Orthopaedic Surgery

## 2017-01-29 ENCOUNTER — Encounter (HOSPITAL_COMMUNITY)
Admission: RE | Disposition: A | Discharge status: Home Health | Payer: Self-pay | Source: Ambulatory Visit | Attending: Orthopaedic Surgery

## 2017-01-29 ENCOUNTER — Encounter (HOSPITAL_COMMUNITY): Payer: Self-pay | Admitting: *Deleted

## 2017-01-29 ENCOUNTER — Inpatient Hospital Stay (HOSPITAL_COMMUNITY): Payer: Medicare Other | Admitting: Certified Registered"

## 2017-01-29 ENCOUNTER — Inpatient Hospital Stay (HOSPITAL_COMMUNITY): Payer: Medicare Other | Admitting: Vascular Surgery

## 2017-01-29 DIAGNOSIS — I129 Hypertensive chronic kidney disease with stage 1 through stage 4 chronic kidney disease, or unspecified chronic kidney disease: Secondary | ICD-10-CM | POA: Diagnosis present

## 2017-01-29 DIAGNOSIS — E785 Hyperlipidemia, unspecified: Secondary | ICD-10-CM | POA: Diagnosis present

## 2017-01-29 DIAGNOSIS — H409 Unspecified glaucoma: Secondary | ICD-10-CM | POA: Diagnosis present

## 2017-01-29 DIAGNOSIS — I080 Rheumatic disorders of both mitral and aortic valves: Secondary | ICD-10-CM | POA: Diagnosis present

## 2017-01-29 DIAGNOSIS — Z953 Presence of xenogenic heart valve: Secondary | ICD-10-CM

## 2017-01-29 DIAGNOSIS — M25562 Pain in left knee: Secondary | ICD-10-CM | POA: Diagnosis not present

## 2017-01-29 DIAGNOSIS — Z7982 Long term (current) use of aspirin: Secondary | ICD-10-CM

## 2017-01-29 DIAGNOSIS — N183 Chronic kidney disease, stage 3 (moderate): Secondary | ICD-10-CM | POA: Diagnosis present

## 2017-01-29 DIAGNOSIS — K219 Gastro-esophageal reflux disease without esophagitis: Secondary | ICD-10-CM | POA: Diagnosis present

## 2017-01-29 DIAGNOSIS — F4310 Post-traumatic stress disorder, unspecified: Secondary | ICD-10-CM | POA: Diagnosis present

## 2017-01-29 DIAGNOSIS — G8918 Other acute postprocedural pain: Secondary | ICD-10-CM | POA: Diagnosis not present

## 2017-01-29 DIAGNOSIS — Z8249 Family history of ischemic heart disease and other diseases of the circulatory system: Secondary | ICD-10-CM

## 2017-01-29 DIAGNOSIS — Z87891 Personal history of nicotine dependence: Secondary | ICD-10-CM

## 2017-01-29 DIAGNOSIS — Z96651 Presence of right artificial knee joint: Secondary | ICD-10-CM | POA: Diagnosis present

## 2017-01-29 DIAGNOSIS — I272 Pulmonary hypertension, unspecified: Secondary | ICD-10-CM | POA: Diagnosis present

## 2017-01-29 DIAGNOSIS — F329 Major depressive disorder, single episode, unspecified: Secondary | ICD-10-CM | POA: Diagnosis present

## 2017-01-29 DIAGNOSIS — Z6833 Body mass index (BMI) 33.0-33.9, adult: Secondary | ICD-10-CM

## 2017-01-29 DIAGNOSIS — Z833 Family history of diabetes mellitus: Secondary | ICD-10-CM

## 2017-01-29 DIAGNOSIS — I10 Essential (primary) hypertension: Secondary | ICD-10-CM | POA: Diagnosis not present

## 2017-01-29 DIAGNOSIS — E039 Hypothyroidism, unspecified: Secondary | ICD-10-CM | POA: Diagnosis present

## 2017-01-29 DIAGNOSIS — M1712 Unilateral primary osteoarthritis, left knee: Secondary | ICD-10-CM | POA: Diagnosis present

## 2017-01-29 HISTORY — PX: TOTAL KNEE ARTHROPLASTY: SHX125

## 2017-01-29 SURGERY — ARTHROPLASTY, KNEE, TOTAL
Anesthesia: Monitor Anesthesia Care | Site: Knee | Laterality: Left

## 2017-01-29 MED ORDER — PHENOL 1.4 % MT LIQD: 1.0000 | OROMUCOSAL | Status: DC | PRN

## 2017-01-29 MED ORDER — FENTANYL CITRATE (PF) 100 MCG/2ML IJ SOLN
INTRAMUSCULAR | Status: AC
  Administered 2017-01-29: 100 ug
  Filled 2017-01-29: qty 2

## 2017-01-29 MED ORDER — DOCUSATE SODIUM 100 MG PO CAPS: 300.0000 mg | ORAL_CAPSULE | Freq: Every day | ORAL | Status: DC

## 2017-01-29 MED ORDER — ROPIVACAINE HCL 7.5 MG/ML IJ SOLN
INTRAMUSCULAR | Status: DC | PRN
  Administered 2017-01-29: 20 mL via PERINEURAL

## 2017-01-29 MED ORDER — BUPIVACAINE-EPINEPHRINE (PF) 0.5% -1:200000 IJ SOLN
INTRAMUSCULAR | Status: DC | PRN
  Administered 2017-01-29: 30 mL

## 2017-01-29 MED ORDER — LEVOTHYROXINE SODIUM 25 MCG PO TABS
137.0000 ug | ORAL_TABLET | Freq: Every day | ORAL | Status: DC
  Administered 2017-01-30 – 2017-01-31 (×2): 137 ug via ORAL
  Filled 2017-01-29 (×2): qty 1

## 2017-01-29 MED ORDER — CHLORHEXIDINE GLUCONATE 4 % EX LIQD: 60.0000 mL | Freq: Once | CUTANEOUS | Status: DC

## 2017-01-29 MED ORDER — BUPIVACAINE LIPOSOME 1.3 % IJ SUSP
20.0000 mL | Freq: Once | INTRAMUSCULAR | Status: DC
  Filled 2017-01-29: qty 20

## 2017-01-29 MED ORDER — METOCLOPRAMIDE HCL 5 MG PO TABS
5.0000 mg | ORAL_TABLET | Freq: Three times a day (TID) | ORAL | Status: DC | PRN
Start: 2017-01-29 — End: 2017-01-31

## 2017-01-29 MED ORDER — HYDROMORPHONE HCL 1 MG/ML IJ SOLN: 0.2500 mg | INTRAMUSCULAR | Status: DC | PRN

## 2017-01-29 MED ORDER — ONDANSETRON HCL 4 MG/2ML IJ SOLN
4.0000 mg | Freq: Four times a day (QID) | INTRAMUSCULAR | Status: DC | PRN
  Administered 2017-01-30: 4 mg via INTRAVENOUS
  Filled 2017-01-29: qty 2

## 2017-01-29 MED ORDER — METHOCARBAMOL 500 MG PO TABS
ORAL_TABLET | ORAL | Status: AC
  Administered 2017-01-29: 500 mg
  Filled 2017-01-29: qty 1

## 2017-01-29 MED ORDER — ALUM & MAG HYDROXIDE-SIMETH 200-200-20 MG/5ML PO SUSP: 30.0000 mL | ORAL | Status: DC | PRN

## 2017-01-29 MED ORDER — POLYETHYL GLYCOL-PROPYL GLYCOL 0.4-0.3 % OP SOLN: 1.0000 [drp] | Freq: Three times a day (TID) | OPHTHALMIC | Status: DC | PRN

## 2017-01-29 MED ORDER — 0.9 % SODIUM CHLORIDE (POUR BTL) OPTIME
TOPICAL | Status: DC | PRN
  Administered 2017-01-29: 1000 mL

## 2017-01-29 MED ORDER — CEFAZOLIN SODIUM-DEXTROSE 2-4 GM/100ML-% IV SOLN
2.0000 g | INTRAVENOUS | Status: AC
  Administered 2017-01-29: 2 g via INTRAVENOUS

## 2017-01-29 MED ORDER — PANTOPRAZOLE SODIUM 40 MG PO TBEC
40.0000 mg | DELAYED_RELEASE_TABLET | Freq: Every day | ORAL | Status: DC
  Administered 2017-01-29 – 2017-01-30 (×2): 40 mg via ORAL
  Filled 2017-01-29 (×2): qty 1

## 2017-01-29 MED ORDER — SODIUM CHLORIDE 0.9 % IR SOLN
Status: DC | PRN
  Administered 2017-01-29: 3000 mL

## 2017-01-29 MED ORDER — HYDROMORPHONE HCL 2 MG/ML IJ SOLN
0.5000 mg | INTRAMUSCULAR | Status: DC | PRN
  Administered 2017-01-29 – 2017-01-30 (×5): 1 mg via INTRAVENOUS
  Administered 2017-01-31: 0.5 mg via INTRAVENOUS
  Filled 2017-01-29 (×6): qty 1

## 2017-01-29 MED ORDER — TRANEXAMIC ACID 1000 MG/10ML IV SOLN
INTRAVENOUS | Status: DC | PRN
  Administered 2017-01-29: 2000 mg via TOPICAL

## 2017-01-29 MED ORDER — CEFAZOLIN SODIUM-DEXTROSE 2-4 GM/100ML-% IV SOLN
INTRAVENOUS | Status: AC
  Filled 2017-01-29: qty 100

## 2017-01-29 MED ORDER — METHOCARBAMOL 500 MG PO TABS
500.0000 mg | ORAL_TABLET | Freq: Four times a day (QID) | ORAL | Status: DC | PRN
  Administered 2017-01-29 – 2017-01-31 (×4): 500 mg via ORAL
  Filled 2017-01-29 (×4): qty 1

## 2017-01-29 MED ORDER — PROMETHAZINE HCL 25 MG/ML IJ SOLN: 6.2500 mg | INTRAMUSCULAR | Status: DC | PRN

## 2017-01-29 MED ORDER — GLYCOPYRROLATE 0.2 MG/ML IJ SOLN
INTRAMUSCULAR | Status: DC | PRN
  Administered 2017-01-29: 0.2 mg via INTRAVENOUS

## 2017-01-29 MED ORDER — ONDANSETRON HCL 4 MG/2ML IJ SOLN
INTRAMUSCULAR | Status: AC
  Filled 2017-01-29: qty 2

## 2017-01-29 MED ORDER — ACETAMINOPHEN 650 MG RE SUPP: 650.0000 mg | Freq: Four times a day (QID) | RECTAL | Status: DC | PRN

## 2017-01-29 MED ORDER — DIPHENHYDRAMINE HCL 12.5 MG/5ML PO ELIX: 12.5000 mg | ORAL_SOLUTION | ORAL | Status: DC | PRN

## 2017-01-29 MED ORDER — BUPIVACAINE LIPOSOME 1.3 % IJ SUSP
INTRAMUSCULAR | Status: DC | PRN
  Administered 2017-01-29: 20 mL

## 2017-01-29 MED ORDER — ASPIRIN EC 325 MG PO TBEC
325.0000 mg | DELAYED_RELEASE_TABLET | Freq: Two times a day (BID) | ORAL | Status: DC
  Administered 2017-01-30 – 2017-01-31 (×3): 325 mg via ORAL
  Filled 2017-01-29 (×3): qty 1

## 2017-01-29 MED ORDER — FENTANYL CITRATE (PF) 100 MCG/2ML IJ SOLN
INTRAMUSCULAR | Status: AC
  Filled 2017-01-29: qty 2

## 2017-01-29 MED ORDER — ONDANSETRON HCL 4 MG PO TABS: 4.0000 mg | ORAL_TABLET | Freq: Four times a day (QID) | ORAL | Status: DC | PRN

## 2017-01-29 MED ORDER — BUPIVACAINE-EPINEPHRINE (PF) 0.5% -1:200000 IJ SOLN
INTRAMUSCULAR | Status: AC
  Filled 2017-01-29: qty 30

## 2017-01-29 MED ORDER — ONDANSETRON HCL 4 MG/2ML IJ SOLN
INTRAMUSCULAR | Status: DC | PRN
  Administered 2017-01-29: 4 mg via INTRAVENOUS

## 2017-01-29 MED ORDER — METOPROLOL SUCCINATE ER 25 MG PO TB24
25.0000 mg | ORAL_TABLET | Freq: Every day | ORAL | Status: DC
  Administered 2017-01-30 – 2017-01-31 (×2): 25 mg via ORAL
  Filled 2017-01-29 (×2): qty 1

## 2017-01-29 MED ORDER — HYDROCODONE-ACETAMINOPHEN 5-325 MG PO TABS
1.0000 | ORAL_TABLET | ORAL | Status: DC | PRN
  Administered 2017-01-29 – 2017-01-31 (×11): 2 via ORAL
  Filled 2017-01-29 (×10): qty 2

## 2017-01-29 MED ORDER — METOCLOPRAMIDE HCL 5 MG/ML IJ SOLN: 5.0000 mg | Freq: Three times a day (TID) | INTRAMUSCULAR | Status: DC | PRN

## 2017-01-29 MED ORDER — LACTATED RINGERS IV SOLN
INTRAVENOUS | Status: DC
  Administered 2017-01-29 (×2): via INTRAVENOUS

## 2017-01-29 MED ORDER — DOCUSATE SODIUM 100 MG PO CAPS
100.0000 mg | ORAL_CAPSULE | Freq: Two times a day (BID) | ORAL | Status: DC
  Administered 2017-01-29 – 2017-01-31 (×4): 100 mg via ORAL
  Filled 2017-01-29 (×4): qty 1

## 2017-01-29 MED ORDER — BISACODYL 5 MG PO TBEC: 5.0000 mg | DELAYED_RELEASE_TABLET | Freq: Every day | ORAL | Status: DC | PRN

## 2017-01-29 MED ORDER — HYDROCODONE-ACETAMINOPHEN 5-325 MG PO TABS
ORAL_TABLET | ORAL | Status: AC
  Filled 2017-01-29: qty 2

## 2017-01-29 MED ORDER — ACETAMINOPHEN 325 MG PO TABS: 650.0000 mg | ORAL_TABLET | Freq: Four times a day (QID) | ORAL | Status: DC | PRN

## 2017-01-29 MED ORDER — TRANEXAMIC ACID 1000 MG/10ML IV SOLN
1000.0000 mg | INTRAVENOUS | Status: AC
  Administered 2017-01-29: 1000 mg via INTRAVENOUS
  Filled 2017-01-29: qty 10

## 2017-01-29 MED ORDER — LATANOPROST 0.005 % OP SOLN
1.0000 [drp] | Freq: Every day | OPHTHALMIC | Status: DC
  Administered 2017-01-29 – 2017-01-30 (×2): 1 [drp] via OPHTHALMIC
  Filled 2017-01-29: qty 2.5

## 2017-01-29 MED ORDER — CEFAZOLIN SODIUM-DEXTROSE 2-4 GM/100ML-% IV SOLN
2.0000 g | Freq: Four times a day (QID) | INTRAVENOUS | Status: AC
  Administered 2017-01-29 – 2017-01-30 (×2): 2 g via INTRAVENOUS
  Filled 2017-01-29 (×2): qty 100

## 2017-01-29 MED ORDER — MENTHOL 3 MG MT LOZG: 1.0000 | LOZENGE | OROMUCOSAL | Status: DC | PRN

## 2017-01-29 MED ORDER — TRANEXAMIC ACID 1000 MG/10ML IV SOLN
1000.0000 mg | Freq: Once | INTRAVENOUS | Status: AC
  Administered 2017-01-29: 1000 mg via INTRAVENOUS
  Filled 2017-01-29: qty 10

## 2017-01-29 MED ORDER — LAMOTRIGINE 100 MG PO TABS
100.0000 mg | ORAL_TABLET | Freq: Every day | ORAL | Status: DC
  Administered 2017-01-29 – 2017-01-30 (×2): 100 mg via ORAL
  Filled 2017-01-29 (×2): qty 1

## 2017-01-29 MED ORDER — LAMOTRIGINE 100 MG PO TABS: 100.0000 mg | ORAL_TABLET | Freq: Two times a day (BID) | ORAL | Status: DC

## 2017-01-29 MED ORDER — TRAZODONE HCL 100 MG PO TABS
300.0000 mg | ORAL_TABLET | Freq: Every day | ORAL | Status: DC
  Administered 2017-01-29 – 2017-01-30 (×2): 300 mg via ORAL
  Filled 2017-01-29 (×2): qty 3

## 2017-01-29 MED ORDER — MIDAZOLAM HCL 2 MG/2ML IJ SOLN
INTRAMUSCULAR | Status: AC
  Administered 2017-01-29: 2 mg
  Filled 2017-01-29: qty 2

## 2017-01-29 MED ORDER — PROPOFOL 10 MG/ML IV BOLUS
INTRAVENOUS | Status: DC | PRN
  Administered 2017-01-29: 20 mg via INTRAVENOUS

## 2017-01-29 MED ORDER — PROPOFOL 500 MG/50ML IV EMUL
INTRAVENOUS | Status: DC | PRN
  Administered 2017-01-29: 50 ug/kg/min via INTRAVENOUS

## 2017-01-29 MED ORDER — LACTATED RINGERS IV SOLN
INTRAVENOUS | Status: DC
  Administered 2017-01-29: 21:00:00 via INTRAVENOUS

## 2017-01-29 MED ORDER — PHENYLEPHRINE HCL 10 MG/ML IJ SOLN
INTRAMUSCULAR | Status: DC | PRN
  Administered 2017-01-29: 30 ug/min via INTRAVENOUS

## 2017-01-29 MED ORDER — TRANEXAMIC ACID 1000 MG/10ML IV SOLN
2000.0000 mg | Freq: Once | INTRAVENOUS | Status: DC
  Filled 2017-01-29: qty 20

## 2017-01-29 MED ORDER — LAMOTRIGINE 100 MG PO TABS
200.0000 mg | ORAL_TABLET | Freq: Every morning | ORAL | Status: DC
  Administered 2017-01-29 – 2017-01-31 (×3): 200 mg via ORAL
  Filled 2017-01-29 (×3): qty 2

## 2017-01-29 MED ORDER — LOSARTAN POTASSIUM 50 MG PO TABS
50.0000 mg | ORAL_TABLET | Freq: Every day | ORAL | Status: DC
  Administered 2017-01-30 – 2017-01-31 (×2): 50 mg via ORAL
  Filled 2017-01-29 (×2): qty 1

## 2017-01-29 MED ORDER — METHOCARBAMOL 1000 MG/10ML IJ SOLN
500.0000 mg | Freq: Four times a day (QID) | INTRAVENOUS | Status: DC | PRN
  Filled 2017-01-29: qty 5

## 2017-01-29 MED ORDER — FUROSEMIDE 20 MG PO TABS: 20.0000 mg | ORAL_TABLET | Freq: Every day | ORAL | Status: DC | PRN

## 2017-01-29 SURGICAL SUPPLY — 59 items
BAG DECANTER FOR FLEXI CONT (MISCELLANEOUS) IMPLANT
BANDAGE ACE 4X5 VEL STRL LF (GAUZE/BANDAGES/DRESSINGS) ×3 IMPLANT
BANDAGE ESMARK 6X9 LF (GAUZE/BANDAGES/DRESSINGS) ×1 IMPLANT
BLADE SAGITTAL 25.0X1.19X90 (BLADE) IMPLANT
BLADE SAGITTAL 25.0X1.19X90MM (BLADE)
BLADE SAW SGTL 13.0X1.19X90.0M (BLADE) IMPLANT
BNDG CMPR 9X6 STRL LF SNTH (GAUZE/BANDAGES/DRESSINGS) ×1
BNDG CMPR MED 10X6 ELC LF (GAUZE/BANDAGES/DRESSINGS) ×1
BNDG ELASTIC 6X10 VLCR STRL LF (GAUZE/BANDAGES/DRESSINGS) ×3 IMPLANT
BNDG ESMARK 6X9 LF (GAUZE/BANDAGES/DRESSINGS) ×3
BNDG GAUZE ELAST 4 BULKY (GAUZE/BANDAGES/DRESSINGS) ×6 IMPLANT
BOWL SMART MIX CTS (DISPOSABLE) ×3 IMPLANT
CAP KNEE TOTAL 3 SIGMA ×2 IMPLANT
CEMENT HV SMART SET (Cement) ×6 IMPLANT
CLOSURE WOUND 1/2 X4 (GAUZE/BANDAGES/DRESSINGS) ×1
COVER SURGICAL LIGHT HANDLE (MISCELLANEOUS) ×3 IMPLANT
CUFF TOURNIQUET SINGLE 34IN LL (TOURNIQUET CUFF) ×3 IMPLANT
CUFF TOURNIQUET SINGLE 44IN (TOURNIQUET CUFF) IMPLANT
DECANTER SPIKE VIAL GLASS SM (MISCELLANEOUS) ×3 IMPLANT
DRAPE EXTREMITY T 121X128X90 (DRAPE) ×3 IMPLANT
DRAPE PROXIMA HALF (DRAPES) ×6 IMPLANT
DRAPE U-SHAPE 47X51 STRL (DRAPES) ×3 IMPLANT
DRSG ADAPTIC 3X8 NADH LF (GAUZE/BANDAGES/DRESSINGS) ×3 IMPLANT
DRSG AQUACEL AG ADV 3.5X10 (GAUZE/BANDAGES/DRESSINGS) ×2 IMPLANT
DRSG PAD ABDOMINAL 8X10 ST (GAUZE/BANDAGES/DRESSINGS) ×3 IMPLANT
DURAPREP 26ML APPLICATOR (WOUND CARE) ×3 IMPLANT
ELECT REM PT RETURN 9FT ADLT (ELECTROSURGICAL) ×3
ELECTRODE REM PT RTRN 9FT ADLT (ELECTROSURGICAL) ×1 IMPLANT
GAUZE SPONGE 4X4 12PLY STRL (GAUZE/BANDAGES/DRESSINGS) ×3 IMPLANT
GLOVE BIO SURGEON STRL SZ8 (GLOVE) ×6 IMPLANT
GLOVE BIOGEL PI IND STRL 8 (GLOVE) ×2 IMPLANT
GLOVE BIOGEL PI INDICATOR 8 (GLOVE) ×4
GOWN STRL REUS W/ TWL LRG LVL3 (GOWN DISPOSABLE) ×1 IMPLANT
GOWN STRL REUS W/ TWL XL LVL3 (GOWN DISPOSABLE) ×2 IMPLANT
GOWN STRL REUS W/TWL LRG LVL3 (GOWN DISPOSABLE) ×3
GOWN STRL REUS W/TWL XL LVL3 (GOWN DISPOSABLE) ×6
HANDPIECE INTERPULSE COAX TIP (DISPOSABLE) ×3
HOOD PEEL AWAY FACE SHEILD DIS (HOOD) ×6 IMPLANT
IMMOBILIZER KNEE 22 UNIV (SOFTGOODS) ×3 IMPLANT
KIT BASIN OR (CUSTOM PROCEDURE TRAY) ×3 IMPLANT
KIT ROOM TURNOVER OR (KITS) ×3 IMPLANT
MANIFOLD NEPTUNE II (INSTRUMENTS) ×3 IMPLANT
NDL HYPO 21X1 ECLIPSE (NEEDLE) ×1 IMPLANT
NEEDLE HYPO 21X1 ECLIPSE (NEEDLE) ×3 IMPLANT
NS IRRIG 1000ML POUR BTL (IV SOLUTION) ×3 IMPLANT
PACK TOTAL JOINT (CUSTOM PROCEDURE TRAY) ×3 IMPLANT
PAD ARMBOARD 7.5X6 YLW CONV (MISCELLANEOUS) ×6 IMPLANT
SET HNDPC FAN SPRY TIP SCT (DISPOSABLE) ×1 IMPLANT
STRIP CLOSURE SKIN 1/2X4 (GAUZE/BANDAGES/DRESSINGS) ×2 IMPLANT
SUT MNCRL AB 3-0 PS2 18 (SUTURE) ×3 IMPLANT
SUT VIC AB 0 CT1 27 (SUTURE) ×6
SUT VIC AB 0 CT1 27XBRD ANBCTR (SUTURE) ×2 IMPLANT
SUT VIC AB 2-0 CT1 27 (SUTURE) ×6
SUT VIC AB 2-0 CT1 TAPERPNT 27 (SUTURE) ×2 IMPLANT
SUT VLOC 180 0 24IN GS25 (SUTURE) ×3 IMPLANT
SYR 50ML LL SCALE MARK (SYRINGE) ×3 IMPLANT
TOWEL OR 17X24 6PK STRL BLUE (TOWEL DISPOSABLE) ×3 IMPLANT
TOWEL OR 17X26 10 PK STRL BLUE (TOWEL DISPOSABLE) ×3 IMPLANT
TRAY CATH 16FR W/PLASTIC CATH (SET/KITS/TRAYS/PACK) ×2 IMPLANT

## 2017-01-29 NOTE — Transfer of Care (Signed)
Immediate Anesthesia Transfer of Care Note  Patient: Maureen Keller  Procedure(s) Performed: Procedure(s): LEFT TOTAL KNEE ARTHROPLASTY (Left)  Patient Location: PACU  Anesthesia Type:MAC and Spinal  Level of Consciousness: awake, alert  and oriented  Airway & Oxygen Therapy: Patient Spontanous Breathing  Post-op Assessment: Report given to RN  Post vital signs: Reviewed and stable  Last Vitals:  Vitals:   01/29/17 1220 01/29/17 1225  BP:    Pulse: 64 65  Resp: 16 14  Temp:      Last Pain:  Vitals:   01/29/17 1142  TempSrc: Oral      Patients Stated Pain Goal: 3 (09/32/67 1245)  Complications: No apparent anesthesia complications

## 2017-01-29 NOTE — Anesthesia Postprocedure Evaluation (Addendum)
Anesthesia Post Note  Patient: Maureen Keller  Procedure(s) Performed: Procedure(s) (LRB): LEFT TOTAL KNEE ARTHROPLASTY (Left)  Patient location during evaluation: PACU Anesthesia Type: MAC Level of consciousness: awake and alert Pain management: pain level controlled Vital Signs Assessment: post-procedure vital signs reviewed and stable Respiratory status: spontaneous breathing and respiratory function stable Cardiovascular status: blood pressure returned to baseline and stable Postop Assessment: spinal receding Anesthetic complications: no       Last Vitals:  Vitals:   01/29/17 1545 01/29/17 1546  BP:  (!) 124/58  Pulse: (!) 59 63  Resp: 11 17  Temp:      Last Pain:  Vitals:   01/29/17 1517  TempSrc:   PainSc: 0-No pain                 Azaria Bartell DANIEL

## 2017-01-29 NOTE — Anesthesia Preprocedure Evaluation (Addendum)
Anesthesia Evaluation  Patient identified by MRN, date of birth, ID band Patient awake    Reviewed: Allergy & Precautions, NPO status , Patient's Chart, lab work & pertinent test results  History of Anesthesia Complications Negative for: history of anesthetic complications  Airway Mallampati: II  TM Distance: >3 FB Neck ROM: Full    Dental no notable dental hx. (+) Dental Advisory Given   Pulmonary former smoker,    Pulmonary exam normal        Cardiovascular hypertension, Normal cardiovascular exam+ Valvular Problems/Murmurs   Conclusion   1. No angiographic evidence of CAD 2. Severe aortic valve stenosis by echo (Cath data with peak to peak gradient 14 mm Hg, mean gradient 13.7 mm Hg, AVA 1.03 cm2).  3. Mild elevation filling pressures.   Recommendations: Will discuss with Dr. Radford Pax. She appears to have severe AS with moderate AI by echo. She is relatively young and very active. She appears to be a good candidate for AVR by conventional approach. I will place a consult to our CT surgery division to discuss AVR.  Aortic valve is functioning properly. Pump function is normal. No fluid surrounding the heart. She does have mild diastolic dysfunction, which means that the left bottom chamber of the heart dose not relax as well as it should. This is a common finding that occurs with age. It can lead to occasional volume overload/ fluid retention which can cause shortness of breath. Check with patient to see if lasix helped her breathing any. If so, she can continue to use as needed for this purpose. Her labs show normal kidney function and K level.    Neuro/Psych PSYCHIATRIC DISORDERS Depression negative neurological ROS     GI/Hepatic Neg liver ROS, GERD  ,  Endo/Other    Renal/GU negative Renal ROS     Musculoskeletal   Abdominal   Peds  Hematology   Anesthesia Other Findings   Reproductive/Obstetrics                             Anesthesia Physical Anesthesia Plan  ASA: III  Anesthesia Plan: MAC and Spinal   Post-op Pain Management:    Induction:   Airway Management Planned: Natural Airway and Simple Face Mask  Additional Equipment:   Intra-op Plan:   Post-operative Plan:   Informed Consent: I have reviewed the patients History and Physical, chart, labs and discussed the procedure including the risks, benefits and alternatives for the proposed anesthesia with the patient or authorized representative who has indicated his/her understanding and acceptance.   Dental advisory given  Plan Discussed with: CRNA, Anesthesiologist and Surgeon  Anesthesia Plan Comments:        Anesthesia Quick Evaluation

## 2017-01-29 NOTE — Anesthesia Procedure Notes (Signed)
Anesthesia Regional Block:  Adductor canal block  Pre-Anesthetic Checklist: ,, timeout performed, Correct Patient, Correct Site, Correct Laterality, Correct Procedure, Correct Position, site marked, Risks and benefits discussed,  Surgical consent,  Pre-op evaluation,  At surgeon's request and post-op pain management  Laterality: Left  Prep: chloraprep       Needles:  Injection technique: Single-shot  Needle Type: Stimulator Needle - 80     Needle Length: 10cm 10 cm Needle Gauge: 21 and 21 G    Additional Needles:  Procedures: ultrasound guided (picture in chart) Adductor canal block Narrative:  Start time: 01/29/2017 11:54 AM End time: 01/29/2017 12:04 PM Injection made incrementally with aspirations every 5 mL.  Performed by: Personally

## 2017-01-29 NOTE — Op Note (Signed)
PREOP DIAGNOSIS: DJD LEFT KNEE POSTOP DIAGNOSIS:  same PROCEDURE: LEFT TKR ANESTHESIA: Spinal and block and MAC ATTENDING SURGEON: Fran Neiswonger G ASSISTANT: Loni Dolly PA  INDICATIONS FOR PROCEDURE: Maureen Keller is a 71 y.o. female who has struggled for a long time with pain due to degenerative arthritis of the left knee.  The patient has failed many conservative non-operative measures and at this point has pain which limits the ability to sleep and walk.  The patient is offered total knee replacement.  Informed operative consent was obtained after discussion of possible risks of anesthesia, infection, neurovascular injury, DVT, and death.  The importance of the post-operative rehabilitation protocol to optimize result was stressed extensively with the patient.  SUMMARY OF FINDINGS AND PROCEDURE:  Maureen Keller was taken to the operative suite where under the above anesthesia a left knee replacement was performed.  There were advanced degenerative changes and the bone quality was good.  We used the DePuyLCS system and placed size standard femur, 3 tibia, 38 mm all polyethylene patella, and a size 10 mm spacer.  Loni Dolly PA-C assisted throughout and was invaluable to the completion of the case in that he helped retract and maintain exposure while I placed the components.  He also helped close thereby minimizing OR time.  The patient was admitted for appropriate post-op care to include perioperative antibiotics and mechanical and pharmacologic measures for DVT prophylaxis.  DESCRIPTION OF PROCEDURE:  Maureen Keller was taken to the operative suite where the above anesthesia was applied.  The patient was positioned supine and prepped and draped in normal sterile fashion.  An appropriate time out was performed.  After the administration of kefzol pre-op antibiotic the leg was elevated and exsanguinated and a tourniquet inflated.  A standard longitudinal incision was made on the anterior knee.   Dissection was carried down to the extensor mechanism.  All appropriate anti-infective measures were used including the pre-operative antibiotic, betadine impregnated drape, and closed hooded exhaust systems for each member of the surgical team.  A medial parapatellar incision was made in the extensor mechanism and the knee cap flipped and the knee flexed.  Some residual meniscal tissues were removed along with any remaining ACL/PCL tissue.  A guide was placed on the tibia and a flat cut was made on it's superior surface.  An intramedullary guide was placed in the femur and was utilized to make anterior and posterior cuts creating an appropriate flexion gap.  A second intramedullary guide was placed in the femur to make a distal cut properly balancing the knee with an extension gap equal to the flexion gap.  The three bones sized to the above mentioned sizes and the appropriate guides were placed and utilized.  A trial reduction was done and the knee easily came to full extension and the patella tracked well on flexion.  The trial components were removed and all bones were cleaned with pulsatile lavage and then dried thoroughly.  Cement was mixed and was pressurized onto the bones followed by placement of the aforementioned components.  Excess cement was trimmed and pressure was held on the components until the cement had hardened.  The tourniquet was deflated and a small amount of bleeding was controlled with cautery and pressure.  The knee was irrigated thoroughly.  The extensor mechanism was re-approximated with V-loc suture in running fashion.  The knee was flexed and the repair was solid.  The subcutaneous tissues were re-approximated with #0 and #2-0 vicryl and the  skin closed with a subcuticular stitch and steristrips.  A sterile dressing was applied.  Intraoperative fluids, EBL, and tourniquet time can be obtained from anesthesia records.  DISPOSITION:  The patient was taken to recovery room in stable  condition and admitted for appropriate post-op care to include peri-operative antibiotic and DVT prophylaxis with mechanical and pharmacologic measures.  Lucero Ide G 01/29/2017, 2:06 PM

## 2017-01-29 NOTE — Progress Notes (Signed)
Orthopedic Tech Progress Note Patient Details:  [name] [birth year] CE:[id number]  CPM Left Knee CPM Left Knee: On Left Knee Flexion (Degrees): 90 Left Knee Extension (Degrees): 0   Braulio Bosch 01/29/2017, 3:19 PM

## 2017-01-29 NOTE — Anesthesia Procedure Notes (Signed)
Date/Time: 01/29/2017 12:40 PM Performed by: Barrington Ellison Pre-anesthesia Checklist: Patient identified, Emergency Drugs available, Suction available and Patient being monitored Patient Re-evaluated:Patient Re-evaluated prior to inductionOxygen Delivery Method: Simple face mask

## 2017-01-29 NOTE — Interval H&P Note (Signed)
History and Physical Interval Note:  01/29/2017 11:42 AM  Maureen Keller  has presented today for surgery, with the diagnosis of LEFT KNEE DEGENERATIVE JOINT DISEASE  The various methods of treatment have been discussed with the patient and family. After consideration of risks, benefits and other options for treatment, the patient has consented to  Procedure(s): LEFT TOTAL KNEE ARTHROPLASTY (Left) as a surgical intervention .  The patient's history has been reviewed, patient examined, no change in status, stable for surgery.  I have reviewed the patient's chart and labs.  Questions were answered to the patient's satisfaction.     Audray Rumore G

## 2017-01-30 ENCOUNTER — Encounter (HOSPITAL_COMMUNITY): Payer: Self-pay | Admitting: Orthopaedic Surgery

## 2017-01-30 NOTE — Evaluation (Signed)
Physical Therapy Evaluation Patient Details Name: Maureen Keller MRN: [id number] DOB: Sep 16, 1946 Today's Date: 01/30/2017   History of Present Illness  71 yo female s/p L TKA WBAT .  Pt with significant PMHx of R TKA (10/2016), pulmonary HTN, mitral valve disorder, HTN, CKD III, aortic stenosis s/p replacement, bil carpal tunnel, and R bunionectomy.    Clinical Impression  Pt is POD #1 and is moving well.  She is min assist overall for safety and was able to walk down the hallway a good distance.  She will likely progress well enough to d/c home with her friend's assist at discharge.   PT to follow acutely for deficits listed below.       Follow Up Recommendations Home health PT;Supervision for mobility/OOB    Equipment Recommendations  None recommended by PT    Recommendations for Other Services   NA     Precautions / Restrictions Precautions Precautions: None Restrictions LLE Weight Bearing: Weight bearing as tolerated      Mobility  Bed Mobility Overal bed mobility: Needs Assistance Bed Mobility: Sit to Supine       Sit to supine: Min assist   General bed mobility comments: Pt standing with OT when PT entered the room.  Min assist to assist with lifting her left leg back into bed.  She could not use her right leg to hook it successfully at this point.   Transfers Overall transfer level: Modified independent Equipment used: Rolling Antwine (2 wheeled)             General transfer comment: Pt was able to power up and down with safe hand placement for transitions.   Ambulation/Gait Ambulation/Gait assistance: Min guard Ambulation Distance (Feet): 100 Feet Assistive device: Rolling Huskins (2 wheeled) Gait Pattern/deviations: Step-to pattern;Antalgic Gait velocity: decreased Gait velocity interpretation: Below normal speed for age/gender General Gait Details: Pt with moderately antalgic gait pattern.  Verbal cues for LE sequencing and upright posture.           Balance Overall balance assessment: Needs assistance Sitting-balance support: Feet supported;No upper extremity supported Sitting balance-Leahy Scale: Good     Standing balance support: Bilateral upper extremity supported Standing balance-Leahy Scale: Poor                               Pertinent Vitals/Pain Pain Assessment: Faces Faces Pain Scale: Hurts little more Pain Location: knee Pain Descriptors / Indicators: Discomfort Pain Intervention(s): Limited activity within patient's tolerance;Monitored during session;Repositioned;Ice applied    Home Living Family/patient expects to be discharged to:: Private residence Living Arrangements: Alone Available Help at Discharge: Friend(s);Available 24 hours/day Type of Home: House Home Access: Stairs to enter Entrance Stairs-Rails: Right;Left;Can reach both Entrance Stairs-Number of Steps: 3 Home Layout: One level   Additional Comments: plans to sponge bath upon d/c    Prior Function Level of Independence: Independent         Comments: Walks dogs, drives, cooks, cleans.     Hand Dominance   Dominant Hand: Right    Extremity/Trunk Assessment   Upper Extremity Assessment Upper Extremity Assessment: Defer to OT evaluation    Lower Extremity Assessment Lower Extremity Assessment: LLE deficits/detail LLE Deficits / Details: left leg wtih normal post op pain and weakness.  Ankle 4/5, knee 2+/5, hip flexion 3-/5    Cervical / Trunk Assessment Cervical / Trunk Assessment: Normal  Communication   Communication: No difficulties  Cognition Arousal/Alertness: Awake/alert Behavior  During Therapy: WFL for tasks assessed/performed Overall Cognitive Status: Within Functional Limits for tasks assessed                         Exercises Total Joint Exercises Ankle Circles/Pumps: AROM;Both;20 reps Quad Sets: AROM;Both;10 reps Towel Squeeze: AROM;Both;10 reps Heel Slides: AAROM;Left;10 reps Goniometric  ROM: 6-80   Assessment/Plan    PT Assessment Patient needs continued PT services  PT Problem List Decreased strength;Decreased range of motion;Decreased activity tolerance;Decreased mobility;Decreased balance;Decreased knowledge of use of DME;Decreased knowledge of precautions;Pain          PT Treatment Interventions DME instruction;Gait training;Stair training;Functional mobility training;Therapeutic activities;Therapeutic exercise;Balance training;Patient/family education;Manual techniques;Modalities    PT Goals (Current goals can be found in the Care Plan section)  Acute Rehab PT Goals Patient Stated Goal: to get back to traveling with her friends PT Goal Formulation: With patient Time For Goal Achievement: 02/06/17 Potential to Achieve Goals: Good    Frequency 7X/week    End of Session Equipment Utilized During Treatment: Gait belt Activity Tolerance: Patient limited by pain Patient left: in bed;with call bell/phone within reach Nurse Communication: Mobility status         Time: 1201-1222 PT Time Calculation (min) (ACUTE ONLY): 21 min   Charges:   PT Evaluation $PT Eval Moderate Complexity: 1 Procedure          Jhaden Pizzuto B. Skyla Champagne, PT, DPT [phone]   01/30/2017, 1:49 PM

## 2017-01-30 NOTE — Evaluation (Signed)
Occupational Therapy Evaluation Patient Details Name: Maureen Keller MRN: [id number] DOB: 11/09/1946 Today's Date: 01/30/2017    History of Present Illness 71 yo female s/p L TKA WBAT   Past Medical History:  Diagnosis Date  . Aortic stenosis 11/2015   Severe AS s/p Bioprosthetic AVR  . Arthritis   . Chronic kidney disease (CKD), stage III (moderate)   . Complication of anesthesia    awakened durimg procedure- heart cath 11/2015.  woke up during buninectomy  . Constipation   . Depression    PTSD  . Dyspnea   . GERD (gastroesophageal reflux disease)   . Glaucoma   . Hyperlipidemia   . Hypertension   . Hypothyroidism   . Mitral valve disorder    Mild to moderate MR by echo 03/2016  . Pulmonary hypertension    mild with PASP 63mmHg by echo 03/2016      Clinical Impression   Patient evaluated by Occupational Therapy with no further acute OT needs identified. All education has been completed and the patient has no further questions. See below for any follow-up Occupational Therapy or equipment needs. OT to sign off. Thank you for referral.      Follow Up Recommendations  No OT follow up    Equipment Recommendations  None recommended by OT    Recommendations for Other Services       Precautions / Restrictions Precautions Precautions: None Restrictions Weight Bearing Restrictions: No LLE Weight Bearing: Weight bearing as tolerated      Mobility Bed Mobility               General bed mobility comments: in chair on arrival  Transfers Overall transfer level: Modified independent                    Balance                                            ADL Overall ADL's : Modified independent                                       General ADL Comments: pt able to reach heel with hand and educated on dressing L LE first. pt with previous knee surg on R and same friend (A)ing at that time. Pt plans to sponge bath  until     Vision     Perception     Praxis      Pertinent Vitals/Pain Pain Assessment: Faces Faces Pain Scale: Hurts little more Pain Location: knee Pain Descriptors / Indicators: Discomfort Pain Intervention(s): Monitored during session;Premedicated before session;Repositioned     Hand Dominance Right   Extremity/Trunk Assessment Upper Extremity Assessment Upper Extremity Assessment: Overall WFL for tasks assessed   Lower Extremity Assessment Lower Extremity Assessment: Defer to PT evaluation   Cervical / Trunk Assessment Cervical / Trunk Assessment: Normal   Communication Communication Communication: No difficulties   Cognition Arousal/Alertness: Awake/alert Behavior During Therapy: WFL for tasks assessed/performed Overall Cognitive Status: Within Functional Limits for tasks assessed                     General Comments       Exercises       Shoulder Instructions      Home  Living Family/patient expects to be discharged to:: Private residence Living Arrangements: Alone Available Help at Discharge: Friend(s);Available 24 hours/day Type of Home: House Home Access: Stairs to enter [employer] of Steps: 3 Entrance Stairs-Rails: Right;Left;Can reach both Home Layout: One level     Bathroom Shower/Tub: Teacher, early years/pre: Standard         Additional Comments: plans to sponge bath upon d/c      Prior Functioning/Environment Level of Independence: Independent        Comments: Walks dogs, drives, cooks, cleans.        OT Problem List:     OT Treatment/Interventions:      OT Goals(Current goals can be found in the care plan section) Acute Rehab OT Goals Potential to Achieve Goals: Good  OT Frequency:     Barriers to D/C:            Co-evaluation              End of Session Equipment Utilized During Treatment: Gait belt;Rolling Lukach CPM Left Knee CPM Left Knee: Off Nurse Communication:  Mobility status;Precautions  Activity Tolerance: Patient tolerated treatment well Patient left: Other (comment) (with PT Becca)   [id number] OT Time Calculation (min): 10 min Charges:  OT General Charges $OT Visit: 1 Procedure OT Evaluation $OT Eval Low Complexity: 1 Procedure G-Codes:    Peri Maris Feb 10, 2017, 12:14 PM   Jeri Modena   OTR/L Pager: [phone] Office: [phone] .

## 2017-01-30 NOTE — Progress Notes (Signed)
Subjective: 1 Day Post-Op Procedure(s) (LRB): LEFT TOTAL KNEE ARTHROPLASTY (Left)  Activity level:  wbat Diet tolerance:  ok Voiding:  ok Patient reports pain as mild.    Objective: Vital signs in last 24 hours: Temp:  [97 F (36.1 C)-98.3 F (36.8 C)] 97.6 F (36.4 C) (02/14 0612) Pulse Rate:  [52-80] 63 (02/14 0612) Resp:  [9-35] 16 (02/14 0612) BP: (106-160)/(48-142) 143/67 (02/14 0612) SpO2:  [86 %-100 %] 98 % (02/14 0612) Weight:  [88 kg (194 lb)] 88 kg (194 lb) (02/13 1140)  Labs: No results for input(s): HGB in the last 72 hours. No results for input(s): WBC, RBC, HCT, PLT in the last 72 hours. No results for input(s): NA, K, CL, CO2, BUN, CREATININE, GLUCOSE, CALCIUM in the last 72 hours. No results for input(s): LABPT, INR in the last 72 hours.  Physical Exam:  Neurologically intact ABD soft Neurovascular intact Sensation intact distally Intact pulses distally Dorsiflexion/Plantar flexion intact Incision: dressing C/D/I and no drainage No cellulitis present Compartment soft  Assessment/Plan:  1 Day Post-Op Procedure(s) (LRB): LEFT TOTAL KNEE ARTHROPLASTY (Left) Advance diet Up with therapy D/C IV fluids Plan for discharge tomorrow Discharge home with home health if doing well and cleared by PT. Continue on ASA 325mg  BID x 2 weeks post op. Follow up in office 2 weeks post op.  Jalila Goodnough, Larwance Sachs 01/30/2017, 7:41 AM

## 2017-01-30 NOTE — Care Management Note (Signed)
Case Management Note  Patient Details  Name: Maureen Keller MRN: [id number] Date of Birth: 1945/12/24  Subjective/Objective:  71 yr old female s/p left total knee arthroplasty.  Action/Plan: Case manager spoke with patient concerning Home health and DME needs. [facility]. She has rolling [place] and 3in1 from previous surgery, CPM has been delivered to her home. Patient states her friend is coming from [place] to assist her.   Expected Discharge Date:    01/31/17              Expected Discharge Plan:  [place]  In-House Referral:  NA  Discharge planning Services  CM Consult  Post Acute Care Choice:  Durable Medical Equipment, Home Health Choice offered to:  Patient  DME Arranged:  3-N-1, CPM DME Agency:  [place] [employer]  HH Arranged:  PT HH Agency:  [place]  Status of Service:  Completed, signed off  If discussed at [place] of Stay Meetings, dates discussed:    Additional Comments:  Ninfa Meeker, RN 01/30/2017, 1:39 PM

## 2017-01-30 NOTE — Progress Notes (Signed)
Orthopedic Tech Progress Note Patient Details:  Maureen Keller [birth year] [id number] On cpm at 1910 Patient ID: [name], female   DOB: 07-28-1946, 71 y.o.   MRN: [id number]   Braulio Bosch 01/30/2017, 7:10 PM

## 2017-01-30 NOTE — Progress Notes (Signed)
Physical Therapy Treatment Patient Details Name: Maureen Keller MRN: [id number] DOB: [birth year] Today's Date: 01/30/2017    History of Present Illness 71 yo female s/p L TKA WBAT .  Pt with significant PMHx of R TKA (10/2016), pulmonary HTN, mitral valve disorder, HTN, CKD III, aortic stenosis s/p replacement, bil carpal tunnel, and R bunionectomy.      PT Comments    Pt is POD #1 and this is her second session.  She continues to progress well with her gait, going further this PM down the hallway and we progressed her HEP program. She will need to practice stairs for home entry tomorrow.    Follow Up Recommendations  Home health PT;Supervision for mobility/OOB     Equipment Recommendations  None recommended by PT    Recommendations for Other Services   NA     Precautions / Restrictions Precautions Precautions: None Restrictions LLE Weight Bearing: Weight bearing as tolerated    Mobility  Bed Mobility Overal bed mobility: Needs Assistance Bed Mobility: Sit to Supine       Sit to supine: Min assist   General bed mobility comments: Min assist to help progress her left leg into and out of the bed.   Transfers Overall transfer level: Modified independent Equipment used: Rolling Lecker (2 wheeled)             General transfer comment: Pt was able to power up and down with safe hand placement for transitions.   Ambulation/Gait Ambulation/Gait assistance: Min guard Ambulation Distance (Feet): 110 Feet Assistive device: Rolling Stoneberg (2 wheeled) Gait Pattern/deviations: Step-to pattern;Antalgic Gait velocity: decreased Gait velocity interpretation: Below normal speed for age/gender General Gait Details: Pt with moderately antalgic gait pattern.  Verbal cues for LE sequencing and upright posture.           Balance Overall balance assessment: Needs assistance Sitting-balance support: Feet supported;No upper extremity supported Sitting balance-Leahy Scale:  Good     Standing balance support: Bilateral upper extremity supported Standing balance-Leahy Scale: Poor                      Cognition Arousal/Alertness: Awake/alert Behavior During Therapy: WFL for tasks assessed/performed Overall Cognitive Status: Within Functional Limits for tasks assessed                      Exercises Total Joint Exercises Ankle Circles/Pumps: AROM;Both;20 reps Quad Sets: AROM;Both;10 reps Towel Squeeze: AROM;Both;10 reps Short Arc Quad: AROM;Left;10 reps Heel Slides: AAROM;Left;10 reps Hip ABduction/ADduction: AROM;Left;10 reps Straight Leg Raises: AROM;Left;10 reps Goniometric ROM: 6-80        Pertinent Vitals/Pain Pain Assessment: Faces Faces Pain Scale: Hurts little more Pain Location: knee Pain Descriptors / Indicators: Discomfort Pain Intervention(s): Limited activity within patient's tolerance;Monitored during session;Repositioned    Home Living Family/patient expects to be discharged to:: Private residence Living Arrangements: Alone Available Help at Discharge: Friend(s);Available 24 hours/day Type of Home: House Home Access: Stairs to enter Entrance Stairs-Rails: Right;Left;Can reach both Home Layout: One level   Additional Comments: plans to sponge bath upon d/c    Prior Function Level of Independence: Independent      Comments: Walks dogs, drives, cooks, cleans.   PT Goals (current goals can now be found in the care plan section) Acute Rehab PT Goals Patient Stated Goal: to get back to traveling with her friends PT Goal Formulation: With patient Time For Goal Achievement: 02/06/17 Potential to Achieve Goals: Good Progress towards PT goals: Progressing  toward goals    Frequency    7X/week      PT Plan Current plan remains appropriate       End of Session Equipment Utilized During Treatment: Gait belt Activity Tolerance: Patient limited by pain Patient left: in bed;with call bell/phone within  reach     Time: 1545-1615 PT Time Calculation (min) (ACUTE ONLY): 30 min  Charges:  $Gait Training: 8-22 mins $Therapeutic Activity: 8-22 mins                      Rilyn Upshaw B. [place], [place], DPT [phone]   01/30/2017, 4:26 PM

## 2017-01-30 NOTE — Progress Notes (Signed)
Orthopedic Tech Progress Note Patient Details:  Maureen Keller 08-22-1946 [id number]  Patient ID: Maureen Keller, female   DOB: 11-29-1946, 71 y.o.   MRN: [id number]   Maureen Keller 01/30/2017, 1:21 PM Placed pt's lle on cpm @1320  @0 -70 degrees; RN notified

## 2017-01-31 MED ORDER — ASPIRIN 325 MG PO TBEC: 325.0000 mg | DELAYED_RELEASE_TABLET | Freq: Two times a day (BID) | ORAL | 0 refills | Status: DC

## 2017-01-31 MED ORDER — METHOCARBAMOL 500 MG PO TABS: 500.0000 mg | ORAL_TABLET | Freq: Four times a day (QID) | ORAL | 0 refills | Status: DC | PRN

## 2017-01-31 MED ORDER — HYDROCODONE-ACETAMINOPHEN 5-325 MG PO TABS: 1.0000 | ORAL_TABLET | ORAL | 0 refills | Status: DC | PRN

## 2017-01-31 NOTE — Discharge Summary (Signed)
Patient ID: Maureen Keller MRN: [id number] DOB/AGE: 03/23/1946 71 y.o.  Admit date: 01/29/2017 Discharge date: 01/31/2017  Admission Diagnoses:  Principal Problem:   Primary localized osteoarthritis of left knee Active Problems:   Primary osteoarthritis of left knee   Discharge Diagnoses:  Same  Past Medical History:  Diagnosis Date  . Aortic stenosis 11/2015   Severe AS s/p Bioprosthetic AVR  . Arthritis   . Chronic kidney disease (CKD), stage III (moderate)   . Complication of anesthesia    awakened durimg procedure- heart cath 11/2015.  woke up during buninectomy  . Constipation   . Depression    PTSD  . Dyspnea   . GERD (gastroesophageal reflux disease)   . Glaucoma   . Hyperlipidemia   . Hypertension   . Hypothyroidism   . Mitral valve disorder    Mild to moderate MR by echo 03/2016  . Pulmonary hypertension    mild with PASP 35mmHg by echo 03/2016    Surgeries: Procedure(s): LEFT TOTAL KNEE ARTHROPLASTY on 01/29/2017   Consultants:   Discharged Condition: Improved  Hospital Course: Maureen Keller is an 71 y.o. female who was admitted 01/29/2017 for operative treatment ofPrimary localized osteoarthritis of left knee. Patient has severe unremitting pain that affects sleep, daily activities, and work/hobbies. After pre-op clearance the patient was taken to the operating room on 01/29/2017 and underwent  Procedure(s): LEFT TOTAL KNEE ARTHROPLASTY.    Patient was given perioperative antibiotics: Anti-infectives    Start     Dose/Rate Route Frequency Ordered Stop   01/29/17 1930  ceFAZolin (ANCEF) IVPB 2g/100 mL premix     2 g 200 mL/hr over 30 Minutes Intravenous Every 6 hours 01/29/17 1852 01/30/17 0215   01/29/17 1131  ceFAZolin (ANCEF) 2-4 GM/100ML-% IVPB    Comments:  Scronce, Trina   : cabinet override      01/29/17 1131 01/29/17 1240   01/29/17 1130  ceFAZolin (ANCEF) IVPB 2g/100 mL premix     2 g 200 mL/hr over 30 Minutes Intravenous On call to O.R.  01/29/17 1130 01/29/17 1240       Patient was given sequential compression devices, early ambulation, and chemoprophylaxis to prevent DVT.  Patient benefited maximally from hospital stay and there were no complications.    Recent vital signs: Patient Vitals for the past 24 hrs:  BP Temp Temp src Pulse Resp SpO2  01/31/17 0517 (!) 127/51 98.4 F (36.9 C) Oral 78 - 94 %  01/30/17 2033 (!) 149/88 98.2 F (36.8 C) Oral 85 16 96 %  01/30/17 1415 (!) 134/57 98.6 F (37 C) - 80 16 98 %     Recent laboratory studies: No results for input(s): WBC, HGB, HCT, PLT, NA, K, CL, CO2, BUN, CREATININE, GLUCOSE, INR, CALCIUM in the last 72 hours.  Invalid input(s): PT, 2   Discharge Medications:   Allergies as of 01/31/2017   No Known Allergies     Medication List    STOP taking these medications   ALEVE 220 MG Caps Generic drug:  Naproxen Sodium   meloxicam 15 MG tablet Commonly known as:  MOBIC     TAKE these medications   albuterol 108 (90 Base) MCG/ACT inhaler Commonly known as:  PROVENTIL HFA;VENTOLIN HFA Inhale 2 puffs into the lungs as needed for wheezing or shortness of breath.   aspirin 325 MG EC tablet Take 1 tablet (325 mg total) by mouth 2 (two) times daily after a meal. What changed:  Another medication with the  same name was removed. Continue taking this medication, and follow the directions you see here.   COLACE 100 MG capsule Generic drug:  docusate sodium Take 300 mg by mouth daily.   furosemide 20 MG tablet Commonly known as:  LASIX Take 1 tablet (20 mg total) by mouth daily as needed. Take daily as needed for shortness of breath What changed:  reasons to take this  additional instructions   HYDROcodone-acetaminophen 5-325 MG tablet Commonly known as:  NORCO/VICODIN Take 1-2 tablets by mouth every 4 (four) hours as needed (breakthrough pain).   lamoTRIgine 100 MG tablet Commonly known as:  LAMICTAL Take 100-200 mg by mouth 2 (two) times daily. 200mg   in the morning and 100mg  in the evening   latanoprost 0.005 % ophthalmic solution Commonly known as:  XALATAN Place 1 drop into both eyes at bedtime.   levothyroxine 137 MCG tablet Commonly known as:  SYNTHROID, LEVOTHROID Take 137 mcg by mouth daily before breakfast.   losartan 50 MG tablet Commonly known as:  COZAAR Take 1 tablet (50 mg total) by mouth daily.   LUBRICANT EYE DROPS 0.4-0.3 % Soln Generic drug:  Polyethyl Glycol-Propyl Glycol Place 1-2 drops into both eyes 3 (three) times daily as needed (for dry/irritated eyes.).   methocarbamol 500 MG tablet Commonly known as:  ROBAXIN Take 1 tablet (500 mg total) by mouth every 6 (six) hours as needed for muscle spasms.   metoprolol succinate 25 MG 24 hr tablet Commonly known as:  TOPROL-XL Take 1 tablet (25 mg total) by mouth daily.   omeprazole 20 MG capsule Commonly known as:  PRILOSEC Take 20 mg by mouth at bedtime.   traZODone 100 MG tablet Commonly known as:  DESYREL Take 300 mg by mouth at bedtime.   Vitamin D-3 5000 units Tabs Take 5,000 Units by mouth daily.            Durable Medical Equipment        Start     Ordered   01/29/17 1853  DME Premo rolling  Once    Question:  Patient needs a [place] to treat with the following condition  Answer:  Primary osteoarthritis of left knee   01/29/17 1852   01/29/17 1853  DME 3 n 1  Once     01/29/17 1852   01/29/17 1853  DME Bedside commode  Once    Question:  Patient needs a bedside commode to treat with the following condition  Answer:  Primary osteoarthritis of left knee   01/29/17 1852      Diagnostic Studies: No results found.  Disposition: 06-Home-Health Care Svc  Discharge Instructions    Call MD / Call 911    Complete by:  As directed    If you experience chest pain or shortness of breath, CALL 911 and be transported to the hospital emergency room.  If you develope a fever above 101 F, pus (white drainage) or increased drainage or redness at  the wound, or calf pain, call your surgeon's office.   Constipation Prevention    Complete by:  As directed    Drink plenty of fluids.  Prune juice may be helpful.  You may use a stool softener, such as Colace (over the counter) 100 mg twice a day.  Use MiraLax (over the counter) for constipation as needed.   Diet - low sodium heart healthy    Complete by:  As directed    Discharge instructions    Complete by:  As directed  INSTRUCTIONS AFTER JOINT REPLACEMENT   Remove items at home which could result in a fall. This includes throw rugs or furniture in walking pathways ICE to the affected joint every three hours while awake for 30 minutes at a time, for at least the first 3-5 days, and then as needed for pain and swelling.  Continue to use ice for pain and swelling. You may notice swelling that will progress down to the foot and ankle.  This is normal after surgery.  Elevate your leg when you are not up walking on it.   Continue to use the breathing machine you got in the hospital (incentive spirometer) which will help keep your temperature down.  It is common for your temperature to cycle up and down following surgery, especially at night when you are not up moving around and exerting yourself.  The breathing machine keeps your lungs expanded and your temperature down.   DIET:  As you were doing prior to hospitalization, we recommend a well-balanced diet.  DRESSING / WOUND CARE / SHOWERING  You may shower 3 days after surgery, but keep the wounds dry during showering.  You may use an occlusive plastic wrap (Press'n Seal for example), NO SOAKING/SUBMERGING IN THE BATHTUB.  If the bandage gets wet, change with a clean dry gauze.  If the incision gets wet, pat the wound dry with a clean towel.  ACTIVITY  Increase activity slowly as tolerated, but follow the weight bearing instructions below.   No driving for 6 weeks or until further direction given by your physician.  You cannot drive while  taking narcotics.  No lifting or carrying greater than 10 lbs. until further directed by your surgeon. Avoid periods of inactivity such as sitting longer than an hour when not asleep. This helps prevent blood clots.  You may return to work once you are authorized by your doctor.     WEIGHT BEARING   Weight bearing as tolerated with assist device ([place], cane, etc) as directed, use it as long as suggested by your surgeon or therapist, typically at least 4-6 weeks.   EXERCISES  Results after joint replacement surgery are often greatly improved when you follow the exercise, range of motion and muscle strengthening exercises prescribed by your doctor. Safety measures are also important to protect the joint from further injury. Any time any of these exercises cause you to have increased pain or swelling, decrease what you are doing until you are comfortable again and then slowly increase them. If you have problems or questions, call your caregiver or physical therapist for advice.   Rehabilitation is important following a joint replacement. After just a few days of immobilization, the muscles of the leg can become weakened and shrink (atrophy).  These exercises are designed to build up the tone and strength of the thigh and leg muscles and to improve motion. Often times heat used for twenty to thirty minutes before working out will loosen up your tissues and help with improving the range of motion but do not use heat for the first two weeks following surgery (sometimes heat can increase post-operative swelling).   These exercises can be done on a training (exercise) mat, on the floor, on a table or on a bed. Use whatever works the best and is most comfortable for you.    Use music or television while you are exercising so that the exercises are a pleasant break in your day. This will make your life better with the exercises acting  as a break in your routine that you can look forward to.   Perform all  exercises about fifteen times, three times per day or as directed.  You should exercise both the operative leg and the other leg as well.   Exercises include:   Quad Sets - Tighten up the muscle on the front of the thigh (Quad) and hold for 5-10 seconds.   Straight Leg Raises - With your knee straight (if you were given a brace, keep it on), lift the leg to 60 degrees, hold for 3 seconds, and slowly lower the leg.  Perform this exercise against resistance later as your leg gets stronger.  Leg Slides: Lying on your back, slowly slide your foot toward your buttocks, bending your knee up off the floor (only go as far as is comfortable). Then slowly slide your foot back down until your leg is flat on the floor again.  Angel Wings: Lying on your back spread your legs to the side as far apart as you can without causing discomfort.  Hamstring Strength:  Lying on your back, push your heel against the floor with your leg straight by tightening up the muscles of your buttocks.  Repeat, but this time bend your knee to a comfortable angle, and push your heel against the floor.  You may put a pillow under the heel to make it more comfortable if necessary.   A rehabilitation program following joint replacement surgery can speed recovery and prevent re-injury in the future due to weakened muscles. Contact your doctor or a physical therapist for more information on knee rehabilitation.    CONSTIPATION  Constipation is defined medically as fewer than three stools per week and severe constipation as less than one stool per week.  Even if you have a regular bowel pattern at home, your normal regimen is likely to be disrupted due to multiple reasons following surgery.  Combination of anesthesia, postoperative narcotics, change in appetite and fluid intake all can affect your bowels.   YOU MUST use at least one of the following options; they are listed in order of increasing strength to get the job done.  They are all  available over the counter, and you may need to use some, POSSIBLY even all of these options:    Drink plenty of fluids (prune juice may be helpful) and high fiber foods Colace 100 mg by mouth twice a day  Senokot for constipation as directed and as needed Dulcolax (bisacodyl), take with full glass of water  Miralax (polyethylene glycol) once or twice a day as needed.  If you have tried all these things and are unable to have a bowel movement in the first 3-4 days after surgery call either your surgeon or your primary doctor.    If you experience loose stools or diarrhea, hold the medications until you stool forms back up.  If your symptoms do not get better within 1 week or if they get worse, check with your doctor.  If you experience "the worst abdominal pain ever" or develop nausea or vomiting, please contact the office immediately for further recommendations for treatment.   ITCHING:  If you experience itching with your medications, try taking only a single pain pill, or even half a pain pill at a time.  You can also use Benadryl over the counter for itching or also to help with sleep.   TED HOSE STOCKINGS:  Use stockings on both legs until for at least 2 weeks or as directed  by physician office. They may be removed at night for sleeping.  MEDICATIONS:  See your medication summary on the "After Visit Summary" that nursing will review with you.  You may have some home medications which will be placed on hold until you complete the course of blood thinner medication.  It is important for you to complete the blood thinner medication as prescribed.  PRECAUTIONS:  If you experience chest pain or shortness of breath - call 911 immediately for transfer to the hospital emergency department.   If you develop a fever greater that 101 F, purulent drainage from wound, increased redness or drainage from wound, foul odor from the wound/dressing, or calf pain - CONTACT YOUR SURGEON.                                                    FOLLOW-UP APPOINTMENTS:  If you do not already have a post-op appointment, please call the office for an appointment to be seen by your surgeon.  Guidelines for how soon to be seen are listed in your "After Visit Summary", but are typically between 1-4 weeks after surgery.  OTHER INSTRUCTIONS:   Knee Replacement:  Do not place pillow under knee, focus on keeping the knee straight while resting. CPM instructions: 0-90 degrees, 2 hours in the morning, 2 hours in the afternoon, and 2 hours in the evening. Place foam block, curve side up under heel at all times except when in CPM or when walking.  DO NOT modify, tear, cut, or change the foam block in any way.  MAKE SURE YOU:  Understand these instructions.  Get help right away if you are not doing well or get worse.    Thank you for letting us be a part of your medical care team.  It is a privilege we respect greatly.  We hope these instructions will help you stay on track for a fast and full recovery!   Increase activity slowly as tolerated    Complete by:  As directed       Follow-up Information    DALLDORF,PETER G, MD. Schedule an appointment as soon as possible for a visit in 2 week(s).   Specialty:  Orthopedic Surgery Contact information: [place] [address] [address] [facility].   Why:  Someone from [facility] will contact you to arrange start date and time for therapy. Contact information: [place] [address] [phone]            Signed: Rich Fuchs 01/31/2017, 1:14 PM

## 2017-01-31 NOTE — Progress Notes (Signed)
Physical Therapy Treatment Patient Details Name: Maureen Keller MRN: [id number] DOB: [birth year] Today's Date: 01/31/2017    History of Present Illness 71 yo female s/p L TKA WBAT .  Pt with significant PMHx of R TKA (10/2016), pulmonary HTN, mitral valve disorder, HTN, CKD III, aortic stenosis s/p replacement, bil carpal tunnel, and R bunionectomy.      PT Comments    Pt is progressing well with her mobility.  Stair training simulating home entry reviewed and pt felt confident and only required min guard assist.  She is due to d/c home with HHPT and her friend's 24/7 help.  PT to follow acutely until d/c confirmed.     Follow Up Recommendations  Home health PT;Supervision for mobility/OOB     Equipment Recommendations  None recommended by PT    Recommendations for Other Services   NA     Precautions / Restrictions Precautions Precautions: None Restrictions LLE Weight Bearing: Weight bearing as tolerated    Mobility  Bed Mobility               General bed mobility comments: Pt was OOB in recliner chair.   Transfers Overall transfer level: Modified independent Equipment used: Rolling Fees (2 wheeled)             General transfer comment: Pt was able to power up and down with safe hand placement for transitions.   Ambulation/Gait Ambulation/Gait assistance: Supervision Ambulation Distance (Feet): 120 Feet Assistive device: Rolling Hegarty (2 wheeled) Gait Pattern/deviations: Step-to pattern;Antalgic Gait velocity: decreased Gait velocity interpretation: Below normal speed for age/gender General Gait Details: Pt with moderately antalgic gait pattern.  Verbal cues for LE sequencing and upright posture.    Stairs Stairs: Yes   Stair Management: Two rails;Step to pattern;Forwards;Sideways Number of Stairs: 5 General stair comments: Two rails with verbal cues for safe LE sequencing.  Pt felt better coming down the stairs sideways. Min guard assist for safety  .          Balance Overall balance assessment: Needs assistance Sitting-balance support: Feet supported;No upper extremity supported Sitting balance-Leahy Scale: Good     Standing balance support: Bilateral upper extremity supported Standing balance-Leahy Scale: Poor                      Cognition Arousal/Alertness: Awake/alert Behavior During Therapy: WFL for tasks assessed/performed Overall Cognitive Status: Within Functional Limits for tasks assessed                      Exercises Total Joint Exercises Long Arc Quad: AROM;Left;5 reps Knee Flexion: AROM;AAROM;Left;10 reps Goniometric ROM: 10-75        Pertinent Vitals/Pain Pain Assessment: 0-10 Pain Score: 3  Pain Location: knee Pain Descriptors / Indicators: Discomfort Pain Intervention(s): Limited activity within patient's tolerance;Monitored during session;Repositioned           PT Goals (current goals can now be found in the care plan section) Acute Rehab PT Goals Patient Stated Goal: to get back to traveling with her friends Progress towards PT goals: Progressing toward goals    Frequency    7X/week      PT Plan Current plan remains appropriate       End of Session Equipment Utilized During Treatment: Gait belt Activity Tolerance: Patient limited by pain Patient left: in chair;with call bell/phone within reach     Time: 1037-1057 PT Time Calculation (min) (ACUTE ONLY): 20 min  Charges:  $Gait Training: 8-22  mins                      Herbie Lehrmann B. [place], [place], DPT [phone]   01/31/2017, 5:58 PM

## 2017-01-31 NOTE — Progress Notes (Signed)
Stopped by to visit w/ pt, friend of another [employer]. Pt out of rm, will try again later. Chaplain available for f/u.   01/31/17 1000  Clinical Encounter Type  Visited With Patient not available  Visit Type Psychological support;Spiritual support;Social support;Post-op  Referral From [place] Sharin Altidor, Chaplain

## 2017-01-31 NOTE — Progress Notes (Signed)
Orthopedic Tech Progress Note Patient Details:  Maureen Keller [birth year] [id number]  Patient ID: Maureen Keller, female   DOB: 1946/07/12, 71 y.o.   MRN: [id number] Pt was already up in chair will call when ready for cpm.  Karolee Stamps 01/31/2017, 5:39 AM

## 2017-01-31 NOTE — Progress Notes (Signed)
Pt discharged to home accompanied by family via wheelchair without incident per MD order. Prior to discharge all discharge teachings done both written and verbal and all questions answered. Pt and family member verb understanding of all teachings and agree to comply.  No change in pt from AM assessment. Pain well controlled on discharge. Prescriptions given. Pt has equipment at home.

## 2017-02-02 DIAGNOSIS — K219 Gastro-esophageal reflux disease without esophagitis: Secondary | ICD-10-CM | POA: Diagnosis not present

## 2017-02-02 DIAGNOSIS — E785 Hyperlipidemia, unspecified: Secondary | ICD-10-CM | POA: Diagnosis not present

## 2017-02-02 DIAGNOSIS — M1990 Unspecified osteoarthritis, unspecified site: Secondary | ICD-10-CM | POA: Diagnosis not present

## 2017-02-02 DIAGNOSIS — Z96651 Presence of right artificial knee joint: Secondary | ICD-10-CM | POA: Diagnosis not present

## 2017-02-02 DIAGNOSIS — Z7982 Long term (current) use of aspirin: Secondary | ICD-10-CM | POA: Diagnosis not present

## 2017-02-02 DIAGNOSIS — E039 Hypothyroidism, unspecified: Secondary | ICD-10-CM | POA: Diagnosis not present

## 2017-02-02 DIAGNOSIS — N183 Chronic kidney disease, stage 3 (moderate): Secondary | ICD-10-CM | POA: Diagnosis not present

## 2017-02-02 DIAGNOSIS — I129 Hypertensive chronic kidney disease with stage 1 through stage 4 chronic kidney disease, or unspecified chronic kidney disease: Secondary | ICD-10-CM | POA: Diagnosis not present

## 2017-02-02 DIAGNOSIS — Z952 Presence of prosthetic heart valve: Secondary | ICD-10-CM | POA: Diagnosis not present

## 2017-02-02 DIAGNOSIS — Z96652 Presence of left artificial knee joint: Secondary | ICD-10-CM | POA: Diagnosis not present

## 2017-02-02 DIAGNOSIS — Z471 Aftercare following joint replacement surgery: Secondary | ICD-10-CM | POA: Diagnosis not present

## 2017-02-03 DIAGNOSIS — N183 Chronic kidney disease, stage 3 (moderate): Secondary | ICD-10-CM | POA: Diagnosis not present

## 2017-02-03 DIAGNOSIS — E785 Hyperlipidemia, unspecified: Secondary | ICD-10-CM | POA: Diagnosis not present

## 2017-02-03 DIAGNOSIS — Z96652 Presence of left artificial knee joint: Secondary | ICD-10-CM | POA: Diagnosis not present

## 2017-02-03 DIAGNOSIS — I129 Hypertensive chronic kidney disease with stage 1 through stage 4 chronic kidney disease, or unspecified chronic kidney disease: Secondary | ICD-10-CM | POA: Diagnosis not present

## 2017-02-03 DIAGNOSIS — Z471 Aftercare following joint replacement surgery: Secondary | ICD-10-CM | POA: Diagnosis not present

## 2017-02-03 DIAGNOSIS — M1990 Unspecified osteoarthritis, unspecified site: Secondary | ICD-10-CM | POA: Diagnosis not present

## 2017-02-04 DIAGNOSIS — M1712 Unilateral primary osteoarthritis, left knee: Secondary | ICD-10-CM | POA: Diagnosis not present

## 2017-02-05 DIAGNOSIS — Z96652 Presence of left artificial knee joint: Secondary | ICD-10-CM | POA: Diagnosis not present

## 2017-02-05 DIAGNOSIS — M1990 Unspecified osteoarthritis, unspecified site: Secondary | ICD-10-CM | POA: Diagnosis not present

## 2017-02-05 DIAGNOSIS — I129 Hypertensive chronic kidney disease with stage 1 through stage 4 chronic kidney disease, or unspecified chronic kidney disease: Secondary | ICD-10-CM | POA: Diagnosis not present

## 2017-02-05 DIAGNOSIS — E785 Hyperlipidemia, unspecified: Secondary | ICD-10-CM | POA: Diagnosis not present

## 2017-02-05 DIAGNOSIS — N183 Chronic kidney disease, stage 3 (moderate): Secondary | ICD-10-CM | POA: Diagnosis not present

## 2017-02-05 DIAGNOSIS — Z471 Aftercare following joint replacement surgery: Secondary | ICD-10-CM | POA: Diagnosis not present

## 2017-02-06 DIAGNOSIS — I129 Hypertensive chronic kidney disease with stage 1 through stage 4 chronic kidney disease, or unspecified chronic kidney disease: Secondary | ICD-10-CM | POA: Diagnosis not present

## 2017-02-06 DIAGNOSIS — M1990 Unspecified osteoarthritis, unspecified site: Secondary | ICD-10-CM | POA: Diagnosis not present

## 2017-02-06 DIAGNOSIS — Z471 Aftercare following joint replacement surgery: Secondary | ICD-10-CM | POA: Diagnosis not present

## 2017-02-06 DIAGNOSIS — Z96652 Presence of left artificial knee joint: Secondary | ICD-10-CM | POA: Diagnosis not present

## 2017-02-06 DIAGNOSIS — N183 Chronic kidney disease, stage 3 (moderate): Secondary | ICD-10-CM | POA: Diagnosis not present

## 2017-02-06 DIAGNOSIS — E785 Hyperlipidemia, unspecified: Secondary | ICD-10-CM | POA: Diagnosis not present

## 2017-02-07 DIAGNOSIS — I129 Hypertensive chronic kidney disease with stage 1 through stage 4 chronic kidney disease, or unspecified chronic kidney disease: Secondary | ICD-10-CM | POA: Diagnosis not present

## 2017-02-07 DIAGNOSIS — N183 Chronic kidney disease, stage 3 (moderate): Secondary | ICD-10-CM | POA: Diagnosis not present

## 2017-02-07 DIAGNOSIS — Z471 Aftercare following joint replacement surgery: Secondary | ICD-10-CM | POA: Diagnosis not present

## 2017-02-07 DIAGNOSIS — E785 Hyperlipidemia, unspecified: Secondary | ICD-10-CM | POA: Diagnosis not present

## 2017-02-07 DIAGNOSIS — Z96652 Presence of left artificial knee joint: Secondary | ICD-10-CM | POA: Diagnosis not present

## 2017-02-07 DIAGNOSIS — M1990 Unspecified osteoarthritis, unspecified site: Secondary | ICD-10-CM | POA: Diagnosis not present

## 2017-02-08 DIAGNOSIS — Z471 Aftercare following joint replacement surgery: Secondary | ICD-10-CM | POA: Diagnosis not present

## 2017-02-08 DIAGNOSIS — Z96653 Presence of artificial knee joint, bilateral: Secondary | ICD-10-CM | POA: Diagnosis not present

## 2017-02-08 DIAGNOSIS — M1712 Unilateral primary osteoarthritis, left knee: Secondary | ICD-10-CM | POA: Diagnosis not present

## 2017-02-11 DIAGNOSIS — Z96652 Presence of left artificial knee joint: Secondary | ICD-10-CM | POA: Diagnosis not present

## 2017-02-11 DIAGNOSIS — M25562 Pain in left knee: Secondary | ICD-10-CM | POA: Diagnosis not present

## 2017-02-11 DIAGNOSIS — M25662 Stiffness of left knee, not elsewhere classified: Secondary | ICD-10-CM | POA: Diagnosis not present

## 2017-02-13 DIAGNOSIS — Z96652 Presence of left artificial knee joint: Secondary | ICD-10-CM | POA: Diagnosis not present

## 2017-02-13 DIAGNOSIS — M25662 Stiffness of left knee, not elsewhere classified: Secondary | ICD-10-CM | POA: Diagnosis not present

## 2017-02-13 DIAGNOSIS — M25562 Pain in left knee: Secondary | ICD-10-CM | POA: Diagnosis not present

## 2017-02-18 DIAGNOSIS — M25662 Stiffness of left knee, not elsewhere classified: Secondary | ICD-10-CM | POA: Diagnosis not present

## 2017-02-18 DIAGNOSIS — M25562 Pain in left knee: Secondary | ICD-10-CM | POA: Diagnosis not present

## 2017-02-18 DIAGNOSIS — Z96652 Presence of left artificial knee joint: Secondary | ICD-10-CM | POA: Diagnosis not present

## 2017-02-20 DIAGNOSIS — Z96652 Presence of left artificial knee joint: Secondary | ICD-10-CM | POA: Diagnosis not present

## 2017-02-20 DIAGNOSIS — M25662 Stiffness of left knee, not elsewhere classified: Secondary | ICD-10-CM | POA: Diagnosis not present

## 2017-02-20 DIAGNOSIS — M25562 Pain in left knee: Secondary | ICD-10-CM | POA: Diagnosis not present

## 2017-02-22 DIAGNOSIS — Z0000 Encounter for general adult medical examination without abnormal findings: Secondary | ICD-10-CM | POA: Diagnosis not present

## 2017-02-22 DIAGNOSIS — E039 Hypothyroidism, unspecified: Secondary | ICD-10-CM | POA: Diagnosis not present

## 2017-02-22 DIAGNOSIS — I10 Essential (primary) hypertension: Secondary | ICD-10-CM | POA: Diagnosis not present

## 2017-02-22 DIAGNOSIS — Z1389 Encounter for screening for other disorder: Secondary | ICD-10-CM | POA: Diagnosis not present

## 2017-02-22 DIAGNOSIS — Z952 Presence of prosthetic heart valve: Secondary | ICD-10-CM | POA: Diagnosis not present

## 2017-02-22 DIAGNOSIS — H409 Unspecified glaucoma: Secondary | ICD-10-CM | POA: Diagnosis not present

## 2017-02-22 DIAGNOSIS — E7800 Pure hypercholesterolemia, unspecified: Secondary | ICD-10-CM | POA: Diagnosis not present

## 2017-02-25 DIAGNOSIS — M25562 Pain in left knee: Secondary | ICD-10-CM | POA: Diagnosis not present

## 2017-02-28 DIAGNOSIS — M25562 Pain in left knee: Secondary | ICD-10-CM | POA: Diagnosis not present

## 2017-02-28 DIAGNOSIS — Z96652 Presence of left artificial knee joint: Secondary | ICD-10-CM | POA: Diagnosis not present

## 2017-02-28 DIAGNOSIS — M25662 Stiffness of left knee, not elsewhere classified: Secondary | ICD-10-CM | POA: Diagnosis not present

## 2017-03-08 DIAGNOSIS — M25662 Stiffness of left knee, not elsewhere classified: Secondary | ICD-10-CM | POA: Diagnosis not present

## 2017-03-08 DIAGNOSIS — Z96652 Presence of left artificial knee joint: Secondary | ICD-10-CM | POA: Diagnosis not present

## 2017-03-08 DIAGNOSIS — M25562 Pain in left knee: Secondary | ICD-10-CM | POA: Diagnosis not present

## 2017-03-11 DIAGNOSIS — Z96652 Presence of left artificial knee joint: Secondary | ICD-10-CM | POA: Diagnosis not present

## 2017-03-11 DIAGNOSIS — M25662 Stiffness of left knee, not elsewhere classified: Secondary | ICD-10-CM | POA: Diagnosis not present

## 2017-03-11 DIAGNOSIS — M25562 Pain in left knee: Secondary | ICD-10-CM | POA: Diagnosis not present

## 2017-03-13 DIAGNOSIS — M25639 Stiffness of unspecified wrist, not elsewhere classified: Secondary | ICD-10-CM | POA: Diagnosis not present

## 2017-03-13 DIAGNOSIS — M25562 Pain in left knee: Secondary | ICD-10-CM | POA: Diagnosis not present

## 2017-03-13 DIAGNOSIS — Z96652 Presence of left artificial knee joint: Secondary | ICD-10-CM | POA: Diagnosis not present

## 2017-03-13 DIAGNOSIS — M25662 Stiffness of left knee, not elsewhere classified: Secondary | ICD-10-CM | POA: Diagnosis not present

## 2017-03-18 DIAGNOSIS — Z96652 Presence of left artificial knee joint: Secondary | ICD-10-CM | POA: Diagnosis not present

## 2017-03-18 DIAGNOSIS — M25562 Pain in left knee: Secondary | ICD-10-CM | POA: Diagnosis not present

## 2017-03-18 DIAGNOSIS — M25662 Stiffness of left knee, not elsewhere classified: Secondary | ICD-10-CM | POA: Diagnosis not present

## 2017-03-20 ENCOUNTER — Encounter (HOSPITAL_COMMUNITY): Payer: Self-pay

## 2017-03-20 ENCOUNTER — Emergency Department (HOSPITAL_COMMUNITY)
Admission: EM | Admit: 2017-03-20 | Discharge: 2017-03-21 | Disposition: A | Discharge status: Home / Self Care | Payer: Medicare Other | Attending: Emergency Medicine | Admitting: Emergency Medicine

## 2017-03-20 ENCOUNTER — Emergency Department (HOSPITAL_COMMUNITY): Payer: Medicare Other

## 2017-03-20 DIAGNOSIS — N183 Chronic kidney disease, stage 3 (moderate): Secondary | ICD-10-CM | POA: Insufficient documentation

## 2017-03-20 DIAGNOSIS — K5900 Constipation, unspecified: Secondary | ICD-10-CM | POA: Diagnosis not present

## 2017-03-20 DIAGNOSIS — R0600 Dyspnea, unspecified: Secondary | ICD-10-CM | POA: Diagnosis not present

## 2017-03-20 DIAGNOSIS — Z96652 Presence of left artificial knee joint: Secondary | ICD-10-CM | POA: Diagnosis not present

## 2017-03-20 DIAGNOSIS — Z7982 Long term (current) use of aspirin: Secondary | ICD-10-CM | POA: Insufficient documentation

## 2017-03-20 DIAGNOSIS — M25562 Pain in left knee: Secondary | ICD-10-CM | POA: Diagnosis not present

## 2017-03-20 DIAGNOSIS — Z96653 Presence of artificial knee joint, bilateral: Secondary | ICD-10-CM | POA: Diagnosis not present

## 2017-03-20 DIAGNOSIS — M25662 Stiffness of left knee, not elsewhere classified: Secondary | ICD-10-CM | POA: Diagnosis not present

## 2017-03-20 DIAGNOSIS — E039 Hypothyroidism, unspecified: Secondary | ICD-10-CM | POA: Diagnosis not present

## 2017-03-20 DIAGNOSIS — R109 Unspecified abdominal pain: Secondary | ICD-10-CM | POA: Diagnosis present

## 2017-03-20 DIAGNOSIS — I129 Hypertensive chronic kidney disease with stage 1 through stage 4 chronic kidney disease, or unspecified chronic kidney disease: Secondary | ICD-10-CM | POA: Insufficient documentation

## 2017-03-20 DIAGNOSIS — R0602 Shortness of breath: Secondary | ICD-10-CM | POA: Diagnosis not present

## 2017-03-20 DIAGNOSIS — Z87891 Personal history of nicotine dependence: Secondary | ICD-10-CM | POA: Diagnosis not present

## 2017-03-20 LAB — CBC
HCT: 41.1 % (ref 36.0–46.0)
HEMOGLOBIN: 14.5 g/dL (ref 12.0–15.0)
MCH: 32.6 pg (ref 26.0–34.0)
MCHC: 35.3 g/dL (ref 30.0–36.0)
MCV: 92.4 fL (ref 78.0–100.0)
PLATELETS: 239 10*3/uL (ref 150–400)
RBC: 4.45 MIL/uL (ref 3.87–5.11)
RDW: 13.1 % (ref 11.5–15.5)
WBC: 5 10*3/uL (ref 4.0–10.5)

## 2017-03-20 LAB — URINALYSIS, ROUTINE W REFLEX MICROSCOPIC
BACTERIA UA: NONE SEEN
BILIRUBIN URINE: NEGATIVE
Glucose, UA: NEGATIVE mg/dL
Ketones, ur: NEGATIVE mg/dL
Nitrite: NEGATIVE
PROTEIN: NEGATIVE mg/dL
Specific Gravity, Urine: 1.017 (ref 1.005–1.030)
pH: 5 (ref 5.0–8.0)

## 2017-03-20 LAB — COMPREHENSIVE METABOLIC PANEL
ALT: 18 U/L (ref 14–54)
AST: 23 U/L (ref 15–41)
Albumin: 3.9 g/dL (ref 3.5–5.0)
Alkaline Phosphatase: 70 U/L (ref 38–126)
Anion gap: 8 (ref 5–15)
BUN: 13 mg/dL (ref 6–20)
CHLORIDE: 104 mmol/L (ref 101–111)
CO2: 24 mmol/L (ref 22–32)
CREATININE: 0.7 mg/dL (ref 0.44–1.00)
Calcium: 9.6 mg/dL (ref 8.9–10.3)
GFR calc non Af Amer: >60 mL/min (ref >60)
Glucose, Bld: 107 mg/dL — ABNORMAL HIGH (ref 65–99)
POTASSIUM: 4 mmol/L (ref 3.5–5.1)
SODIUM: 136 mmol/L (ref 135–145)
Total Bilirubin: 0.5 mg/dL (ref 0.3–1.2)
Total Protein: 6.8 g/dL (ref 6.5–8.1)

## 2017-03-20 LAB — I-STAT TROPONIN, ED: TROPONIN I, POC: 0 ng/mL (ref 0.00–0.08)

## 2017-03-20 LAB — D-DIMER, QUANTITATIVE: D-Dimer, Quant: 1.71 ug/mL-FEU — ABNORMAL HIGH (ref 0.00–0.50)

## 2017-03-20 LAB — LIPASE, BLOOD: Lipase: 21 U/L (ref 11–51)

## 2017-03-20 MED ORDER — IOPAMIDOL (ISOVUE-370) INJECTION 76%
INTRAVENOUS | Status: AC
  Administered 2017-03-20: 100 mL via INTRAVENOUS
  Filled 2017-03-20: qty 100

## 2017-03-20 MED ORDER — IOPAMIDOL (ISOVUE-370) INJECTION 76%
100.0000 mL | Freq: Once | INTRAVENOUS | Status: AC | PRN
  Administered 2017-03-20: 100 mL via INTRAVENOUS

## 2017-03-20 MED ORDER — ALBUTEROL SULFATE (2.5 MG/3ML) 0.083% IN NEBU
5.0000 mg | INHALATION_SOLUTION | Freq: Once | RESPIRATORY_TRACT | Status: AC
  Administered 2017-03-20: 5 mg via RESPIRATORY_TRACT
  Filled 2017-03-20: qty 6

## 2017-03-20 NOTE — ED Provider Notes (Signed)
[facility] DEPT Provider Note   CSN: [id number] Arrival date & time: 03/20/17  2121     History   Chief Complaint Chief Complaint  Patient presents with  . Abdominal Pain  . Shortness of Breath    HPI Maureen Keller is a 71 y.o. female.  The history is provided by the patient. No language interpreter was used.  Abdominal Pain    Shortness of Breath  Associated symptoms include abdominal pain.    Maureen Keller is a 71 y.o. female who presents to the Emergency Department complaining of AP/SOB.  She reports issues with chronic constipation has been trying multiple over-the-counter medications. She states that she has worsening constipation is only able to get a small amount of formed stool out today. She has associated intermittent nausea over the last 3 weeks and for the last 10 days she's had progressive shortness of breath with significant dyspnea on his exertion. She has occasional sharp, central chest pain that is fleeting in nature with no clear alleviating or worsening factors. No reports of fevers, cough, vomiting, dysuria. She does have left leg swelling following her recent knee replacement surgery in February. She has occasional upper abdominal pain and pressure.  Past Medical History:  Diagnosis Date  . Aortic stenosis 11/2015   Severe AS s/p Bioprosthetic AVR  . Arthritis   . Chronic kidney disease (CKD), stage III (moderate)   . Complication of anesthesia    awakened durimg procedure- heart cath 11/2015.  woke up during buninectomy  . Constipation   . Depression    PTSD  . Dyspnea   . GERD (gastroesophageal reflux disease)   . Glaucoma   . Hyperlipidemia   . Hypertension   . Hypothyroidism   . Mitral valve disorder    Mild to moderate MR by echo 03/2016  . Pulmonary hypertension    mild with PASP 26mmHg by echo 03/2016    Patient Active Problem List   Diagnosis Date Noted  . Primary localized osteoarthritis of left knee 01/29/2017  . Primary  osteoarthritis of left knee 01/29/2017  . Primary osteoarthritis of right knee 10/09/2016  . GERD (gastroesophageal reflux disease) 05/10/2016  . Pulmonary HTN 05/10/2016  . Encounter for therapeutic drug monitoring 12/23/2015  . S/P AVR (aortic valve replacement) 12/16/2015  . DOE (dyspnea on exertion) 11/07/2015  . Chest pain 05/12/2015  . 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 Aortic stenosis   . Mitral valve disorder   . AR (aortic regurgitation)   . Hypertension     Past Surgical History:  Procedure Laterality Date  . AORTIC VALVE REPLACEMENT N/A 12/16/2015   Procedure: AORTIC VALVE REPLACEMENT (AVR);  Surgeon: Melrose Nakayama, MD;  Location: [facility];  Service: Open Heart Surgery;  Laterality: N/A;  Edwards Intuity Elite Aortic Valve 44mm  . BUNIONECTOMY Right   . CARDIAC CATHETERIZATION  11/04/2009   normal coronaries  . CARDIAC CATHETERIZATION N/A 11/30/2015   Procedure: Right/Left Heart Cath and Coronary Angiography;  Surgeon: Burnell Blanks, MD;  Location: [facility];  Service: Cardiovascular;  Laterality: N/A;  . CARPAL TUNNEL RELEASE Right 1982  . CARPAL TUNNEL RELEASE Left   . CATARACT EXTRACTION Bilateral   . COLONOSCOPY    . TEE WITHOUT CARDIOVERSION N/A 12/16/2015   Procedure: TRANSESOPHAGEAL ECHOCARDIOGRAM (TEE);  Surgeon: Melrose Nakayama, MD;  Location: [facility];  Service: Open Heart Surgery;  Laterality: N/A;  . TOE SURGERY Right    2 nd toe  . TOTAL  KNEE ARTHROPLASTY Right 10/09/2016   Procedure: TOTAL RIGHT KNEE ARTHROPLASTY;  Surgeon: Melrose Nakayama, MD;  Location: [facility];  Service: Orthopedics;  Laterality: Right;  . TOTAL KNEE ARTHROPLASTY Left 01/29/2017   Procedure: LEFT TOTAL KNEE ARTHROPLASTY;  Surgeon: Melrose Nakayama, MD;  Location: [facility];  Service: Orthopedics;  Laterality: Left;  . TRIGGER FINGER RELEASE Bilateral    thumbs - 2 separtate surgeries    OB History    No data available       Home Medications    Prior to  Admission medications   Medication Sig Start Date End Date Taking? Authorizing Provider  albuterol (PROVENTIL HFA;VENTOLIN HFA) 108 (90 Base) MCG/ACT inhaler Inhale 2 puffs into the lungs as needed for wheezing or shortness of breath.   Yes Historical Provider, MD  aspirin EC 81 MG tablet Take 81 mg by mouth daily.   Yes Historical Provider, MD  Cholecalciferol (VITAMIN D-3) 5000 units TABS Take 5,000 Units by mouth daily.   Yes Historical Provider, MD  docusate sodium (COLACE) 100 MG capsule Take 300 mg by mouth daily.    Yes Historical Provider, MD  furosemide (LASIX) 20 MG tablet Take 1 tablet (20 mg total) by mouth daily as needed. Take daily as needed for shortness of breath Patient taking differently: Take 20 mg by mouth daily as needed (shortness of breath/fluid retention.).  06/07/16  Yes Isaiah Serge, NP  lamoTRIgine (LAMICTAL) 100 MG tablet Take 100-200 mg by mouth 2 (two) times daily. 200mg  in the morning and 100mg  in the evening 09/15/13  Yes Historical Provider, MD  latanoprost (XALATAN) 0.005 % ophthalmic solution Place 1 drop into both eyes at bedtime. 04/18/15  Yes Historical Provider, MD  levothyroxine (SYNTHROID, LEVOTHROID) 137 MCG tablet Take 137 mcg by mouth daily before breakfast.   Yes Historical Provider, MD  losartan (COZAAR) 50 MG tablet Take 1 tablet (50 mg total) by mouth daily. 06/01/16  Yes Sueanne Margarita, MD  metoprolol succinate (TOPROL-XL) 25 MG 24 hr tablet Take 1 tablet (25 mg total) by mouth daily. 07/11/16  Yes Sueanne Margarita, MD  omeprazole (PRILOSEC) 20 MG capsule Take 20 mg by mouth at bedtime.    Yes Historical Provider, MD  Polyethyl Glycol-Propyl Glycol (LUBRICANT EYE DROPS) 0.4-0.3 % SOLN Place 1-2 drops into both eyes 3 (three) times daily as needed (for dry/irritated eyes.).   Yes Historical Provider, MD  traZODone (DESYREL) 100 MG tablet Take 300 mg by mouth at bedtime.  09/15/13  Yes Historical Provider, MD  aspirin 325 MG EC tablet Take 1 tablet (325 mg  total) by mouth 2 (two) times daily after a meal. Patient not taking: Reported on 03/20/2017 01/31/17   Loni Dolly, PA-C  HYDROcodone-acetaminophen (NORCO/VICODIN) 5-325 MG tablet Take 1-2 tablets by mouth every 4 (four) hours as needed (breakthrough pain). Patient not taking: Reported on 03/20/2017 01/31/17   Loni Dolly, PA-C  methocarbamol (ROBAXIN) 500 MG tablet Take 1 tablet (500 mg total) by mouth every 6 (six) hours as needed for muscle spasms. Patient not taking: Reported on 03/20/2017 01/31/17   Loni Dolly, PA-C    Family History Family History  Problem Relation Age of Onset  . Diabetes Mother   . Suicidality Mother 39  . Hypertension Father   . Heart disease Father   . Diabetes Father     Social History Social History  Substance Use Topics  . Smoking status: Former Smoker    Years: 12.00    Quit date: 10/29/1976  .  Smokeless tobacco: Never Used  . Alcohol use Yes     Comment: Occ     Allergies   Patient has no known allergies.   Review of Systems Review of Systems  Respiratory: Positive for shortness of breath.   Gastrointestinal: Positive for abdominal pain.  All other systems reviewed and are negative.    Physical Exam Updated Vital Signs BP (!) 152/64   Pulse 65   Temp 97.9 F (36.6 C) (Oral)   Resp (!) 23   SpO2 100%   Physical Exam  Constitutional: She is oriented to person, place, and time. She appears well-developed and well-nourished.  HENT:  Head: Normocephalic and atraumatic.  Cardiovascular: Normal rate and regular rhythm.   Murmur heard. SEM  Pulmonary/Chest: Effort normal and breath sounds normal. No respiratory distress.  Abdominal: Soft. There is no tenderness. There is no rebound and no guarding.  Musculoskeletal: She exhibits no tenderness.  Trace edema to left lower extremity  Neurological: She is alert and oriented to person, place, and time.  Skin: Skin is warm and dry.  Psychiatric: She has a normal mood and affect. Her behavior  is normal.  Nursing note and vitals reviewed.    ED Treatments / Results  Labs (all labs ordered are listed, but only abnormal results are displayed) Labs Reviewed  COMPREHENSIVE METABOLIC PANEL - Abnormal; Notable for the following:       Result Value   Glucose, Bld 107 (*)    All other components within normal limits  URINALYSIS, ROUTINE W REFLEX MICROSCOPIC - Abnormal; Notable for the following:    Hgb urine dipstick SMALL (*)    Leukocytes, UA SMALL (*)    Squamous Epithelial / LPF 0-5 (*)    All other components within normal limits  D-DIMER, QUANTITATIVE (NOT AT [facility]) - Abnormal; Notable for the following:    D-Dimer, Quant 1.71 (*)    All other components within normal limits  LIPASE, BLOOD  CBC  I-STAT TROPOININ, ED  I-STAT TROPOININ, ED    EKG  EKG Interpretation  Date/Time:  Wednesday March 20 2017 21:51:22 EDT Ventricular Rate:  73 PR Interval:    QRS Duration: 83 QT Interval:  363 QTC Calculation: 400 R Axis:   29 Text Interpretation:  Sinus rhythm Left ventricular hypertrophy Nonspecific T abnrm, anterolateral leads Baseline wander in lead(s) V2 Confirmed by Hazle Coca [phone]) on 03/20/2017 10:44:20 PM       Radiology Dg Chest 2 View  Result Date: 03/20/2017 CLINICAL DATA:  Intermittent shortness of breath EXAM: CHEST  2 VIEW COMPARISON:  04/01/2016 FINDINGS: Post sternotomy changes. Valvular prosthesis. No acute infiltrate or effusion. Cardiomediastinal silhouette within normal limits. No pneumothorax. IMPRESSION: No active cardiopulmonary disease. Electronically Signed   By: Donavan Foil M.D.   On: 03/20/2017 21:51   Ct Angio Chest Pe W/cm &/or Wo Cm  Result Date: 03/21/2017 CLINICAL DATA:  Dyspnea, nausea, and constipation x1 week. Elevated D-dimer with microscopic hematuria. Aortic valvular replacement, hypertension and mitral valve disorder. Chronic renal disease stage III. EXAM: CT ANGIOGRAPHY CHEST CT ABDOMEN AND PELVIS WITH CONTRAST TECHNIQUE:  Multidetector CT imaging of the chest was performed using the standard protocol during bolus administration of intravenous contrast. Multiplanar CT image reconstructions and MIPs were obtained to evaluate the vascular anatomy. Multidetector CT imaging of the abdomen and pelvis was performed using the standard protocol during bolus administration of intravenous contrast. CONTRAST:  100 cc Isovue 370 IV COMPARISON:  Chest CT 03/29/2016 FINDINGS: CTA CHEST FINDINGS Cardiovascular:  The study is of quality for the evaluation of pulmonary embolism. There are no filling defects in the central, lobar, segmental or subsegmental pulmonary artery branches to suggest acute pulmonary embolism. Great vessels are normal in course and caliber. Normal heart size. No significant pericardial fluid/thickening. Aortic atherosclerosis without dissection or aneurysm. Atherosclerosis at the origin of right brachiocephalic and left subclavian arteries. Evidence of prior aortic valvular replacement. Mediastinum/Nodes: Unremarkable esophagus. No pathologically enlarged axillary, mediastinal or hilar lymph nodes. Lungs/Pleura: No pneumothorax. No pleural effusion. Scarring in the left upper lobe and left lower lobe. No dominant mass or pneumonic consolidation. Upper abdomen: Unremarkable. Musculoskeletal: No aggressive appearing focal osseous lesions. Median sternotomy sutures. Multilevel degenerative disc disease along the dorsal spine. No acute nor suspicious osseous appearing abnormalities. Review of the MIP images confirms the above findings. CT ABDOMEN and PELVIS FINDINGS Hepatobiliary: No focal liver abnormality is seen. No gallstones, gallbladder wall thickening, or biliary dilatation. Pancreas: Mild atrophy of the pancreatic gland without ductal dilatation or mass. Spleen: Normal Adrenals/Urinary Tract: Normal bilateral adrenal glands. Symmetric enhancement of both kidneys. Small extrarenal pelves are seen bilaterally. No obstructive  uropathy. No nephrolithiasis. Urinary bladder is physiologically distended. Stomach/Bowel: Nondistended stomach. There is normal small bowel rotation without bowel dilatation, fold thickening nor acute inflammation. Moderate colonic stool burden is noted within large bowel to the level of the rectum. Normal-appearing appendix. Vascular/Lymphatic: Aortic atherosclerosis. No enlarged abdominal or pelvic lymph nodes. Reproductive: Uterus and bilateral adnexa are unremarkable. Other: Small fat containing umbilical hernia. No abdominopelvic ascites. Musculoskeletal: Levoscoliosis of the lumbar spine lumbar spondylosis. Degenerative disc disease noted from L1 through L4 with minimal grade 1 retrolisthesis of L4 on L5. No acute nor suspicious osseous abnormalities. IMPRESSION: 1. Status post aortic valvular repair. Aortic atherosclerosis is noted without aneurysm or dissection. No acute pulmonary embolus. Left upper lobe and left lower lobe atelectasis and/or scarring. 2. No acute intra-abdominal or pelvic abnormality. Moderate colonic stool burden. Lumbar spondylosis. Fatty atrophy of the pancreas. Electronically Signed   By: Ashley Royalty M.D.   On: 03/21/2017 00:26   Ct Abdomen Pelvis W Contrast  Result Date: 03/21/2017 CLINICAL DATA:  Dyspnea, nausea, and constipation x1 week. Elevated D-dimer with microscopic hematuria. Aortic valvular replacement, hypertension and mitral valve disorder. Chronic renal disease stage III. EXAM: CT ANGIOGRAPHY CHEST CT ABDOMEN AND PELVIS WITH CONTRAST TECHNIQUE: Multidetector CT imaging of the chest was performed using the standard protocol during bolus administration of intravenous contrast. Multiplanar CT image reconstructions and MIPs were obtained to evaluate the vascular anatomy. Multidetector CT imaging of the abdomen and pelvis was performed using the standard protocol during bolus administration of intravenous contrast. CONTRAST:  100 cc Isovue 370 IV COMPARISON:  Chest CT  03/29/2016 FINDINGS: CTA CHEST FINDINGS Cardiovascular: The study is of quality for the evaluation of pulmonary embolism. There are no filling defects in the central, lobar, segmental or subsegmental pulmonary artery branches to suggest acute pulmonary embolism. Great vessels are normal in course and caliber. Normal heart size. No significant pericardial fluid/thickening. Aortic atherosclerosis without dissection or aneurysm. Atherosclerosis at the origin of right brachiocephalic and left subclavian arteries. Evidence of prior aortic valvular replacement. Mediastinum/Nodes: Unremarkable esophagus. No pathologically enlarged axillary, mediastinal or hilar lymph nodes. Lungs/Pleura: No pneumothorax. No pleural effusion. Scarring in the left upper lobe and left lower lobe. No dominant mass or pneumonic consolidation. Upper abdomen: Unremarkable. Musculoskeletal: No aggressive appearing focal osseous lesions. Median sternotomy sutures. Multilevel degenerative disc disease along the dorsal spine. No acute nor suspicious  osseous appearing abnormalities. Review of the MIP images confirms the above findings. CT ABDOMEN and PELVIS FINDINGS Hepatobiliary: No focal liver abnormality is seen. No gallstones, gallbladder wall thickening, or biliary dilatation. Pancreas: Mild atrophy of the pancreatic gland without ductal dilatation or mass. Spleen: Normal Adrenals/Urinary Tract: Normal bilateral adrenal glands. Symmetric enhancement of both kidneys. Small extrarenal pelves are seen bilaterally. No obstructive uropathy. No nephrolithiasis. Urinary bladder is physiologically distended. Stomach/Bowel: Nondistended stomach. There is normal small bowel rotation without bowel dilatation, fold thickening nor acute inflammation. Moderate colonic stool burden is noted within large bowel to the level of the rectum. Normal-appearing appendix. Vascular/Lymphatic: Aortic atherosclerosis. No enlarged abdominal or pelvic lymph nodes.  Reproductive: Uterus and bilateral adnexa are unremarkable. Other: Small fat containing umbilical hernia. No abdominopelvic ascites. Musculoskeletal: Levoscoliosis of the lumbar spine lumbar spondylosis. Degenerative disc disease noted from L1 through L4 with minimal grade 1 retrolisthesis of L4 on L5. No acute nor suspicious osseous abnormalities. IMPRESSION: 1. Status post aortic valvular repair. Aortic atherosclerosis is noted without aneurysm or dissection. No acute pulmonary embolus. Left upper lobe and left lower lobe atelectasis and/or scarring. 2. No acute intra-abdominal or pelvic abnormality. Moderate colonic stool burden. Lumbar spondylosis. Fatty atrophy of the pancreas. Electronically Signed   By: Ashley Royalty M.D.   On: 03/21/2017 00:26    Procedures Procedures (including critical care time)  Medications Ordered in ED Medications  albuterol (PROVENTIL) (2.5 MG/3ML) 0.083% nebulizer solution 5 mg (5 mg Nebulization Given 03/20/17 2204)  iopamidol (ISOVUE-370) 76 % injection 100 mL (100 mLs Intravenous Contrast Given 03/20/17 2348)     Initial Impression / Assessment and Plan / ED Course  I have reviewed the triage vital signs and the nursing notes.  Pertinent labs & imaging results that were available during my care of the patient were reviewed by me and considered in my medical decision making (see chart for details).     Patient with history of recent knee replacement here for evaluation of upper abdominal bloating, nausea, shortness of breath and occasional chest pain. Lungs are clear on examination with no evidence of respiratory distress. Her d-dimer was elevated, CT PE was negative for acute process. No evidence of PE or dissection. CT abdomen with mild constipation but no evidence of obstruction or acute infectious process. Constipation on and home care for constipation with dietary changes, oral fluid hydration, activity. Discussed outpatient follow-up and return  precautions.  Final Clinical Impressions(s) / ED Diagnoses   Final diagnoses:  Dyspnea, unspecified type  Constipation, unspecified constipation type    New Prescriptions New Prescriptions   No medications on file     Quintella Reichert, MD 03/21/17 0111

## 2017-03-20 NOTE — ED Triage Notes (Signed)
Pt complains of not going to the bathroom for over a week, she has an appt tomorrow with her primary but was told to come to the ER if she became short of breath and nauseated

## 2017-03-21 DIAGNOSIS — K5900 Constipation, unspecified: Secondary | ICD-10-CM | POA: Diagnosis not present

## 2017-03-21 DIAGNOSIS — R0600 Dyspnea, unspecified: Secondary | ICD-10-CM | POA: Diagnosis not present

## 2017-03-21 LAB — I-STAT TROPONIN, ED: TROPONIN I, POC: 0 ng/mL (ref 0.00–0.08)

## 2017-03-29 ENCOUNTER — Telehealth: Payer: Self-pay | Admitting: Cardiology

## 2017-03-29 DIAGNOSIS — Z96652 Presence of left artificial knee joint: Secondary | ICD-10-CM | POA: Diagnosis not present

## 2017-03-29 DIAGNOSIS — M25562 Pain in left knee: Secondary | ICD-10-CM | POA: Diagnosis not present

## 2017-03-29 DIAGNOSIS — M25662 Stiffness of left knee, not elsewhere classified: Secondary | ICD-10-CM | POA: Diagnosis not present

## 2017-03-29 NOTE — Telephone Encounter (Signed)
New Message:    Please call,Nikki says she needs to give you an update on pt's condiition.

## 2017-03-29 NOTE — Telephone Encounter (Signed)
Nikki calling about pt's BP--BP reading during exercise today- 187/93 heart rate 65,  BP after resting 183/80 HR 66.

## 2017-03-29 NOTE — Telephone Encounter (Signed)
I spoke to Maureen Keller states she has been getting some high BP readings recently.  Pt has already scheduled an appointment to see [name], PA Monday 04/01/17-pt will check her BP over the weekend and bring readings with her to appointment on Monday, if she has other BP readings she will bring those also.

## 2017-04-01 ENCOUNTER — Other Ambulatory Visit: Payer: Self-pay

## 2017-04-01 ENCOUNTER — Ambulatory Visit (HOSPITAL_COMMUNITY): Payer: Medicare Other | Attending: Cardiology

## 2017-04-01 ENCOUNTER — Ambulatory Visit (INDEPENDENT_AMBULATORY_CARE_PROVIDER_SITE_OTHER): Payer: Medicare Other | Admitting: Physician Assistant

## 2017-04-01 ENCOUNTER — Encounter: Payer: Self-pay | Admitting: Physician Assistant

## 2017-04-01 VITALS — BP 130/78 | HR 74 | Ht 64.0 in | Wt 189.8 lb

## 2017-04-01 DIAGNOSIS — I059 Rheumatic mitral valve disease, unspecified: Secondary | ICD-10-CM

## 2017-04-01 DIAGNOSIS — I350 Nonrheumatic aortic (valve) stenosis: Secondary | ICD-10-CM

## 2017-04-01 DIAGNOSIS — Z952 Presence of prosthetic heart valve: Secondary | ICD-10-CM

## 2017-04-01 DIAGNOSIS — R0683 Snoring: Secondary | ICD-10-CM

## 2017-04-01 DIAGNOSIS — I272 Pulmonary hypertension, unspecified: Secondary | ICD-10-CM | POA: Insufficient documentation

## 2017-04-01 DIAGNOSIS — I10 Essential (primary) hypertension: Secondary | ICD-10-CM

## 2017-04-01 DIAGNOSIS — M25561 Pain in right knee: Secondary | ICD-10-CM | POA: Diagnosis not present

## 2017-04-01 DIAGNOSIS — M25562 Pain in left knee: Secondary | ICD-10-CM | POA: Diagnosis not present

## 2017-04-01 MED ORDER — LOSARTAN POTASSIUM 100 MG PO TABS: 100.0000 mg | ORAL_TABLET | Freq: Every day | ORAL | 5 refills | Status: DC

## 2017-04-01 NOTE — Patient Instructions (Addendum)
Medication Instructions:  Your physician has recommended you make the following change in your medication: INCREASE Losartan 100mg  daily. An Rx has been sent to your pharmacy.  Labwork: None ordered  Testing/Procedures: Your physician has recommended that you have a sleep study. This test records several body functions during sleep, including: brain activity, eye movement, oxygen and carbon dioxide blood levels, heart rate and rhythm, breathing rate and rhythm, the flow of air through your mouth and nose, snoring, body muscle movements, and chest and belly movement.  Gershon Cull, CMA will call you to schedule  Follow-Up: Your physician recommends that you schedule a follow-up appointment in: 4-6 week with Dr.Turner   Any Other Special Instructions Will Be Listed Below (If Applicable).    DASH Eating Plan DASH stands for "Dietary Approaches to Stop Hypertension." The DASH eating plan is a healthy eating plan that has been shown to reduce high blood pressure (hypertension). It may also reduce your risk for type 2 diabetes, heart disease, and stroke. The DASH eating plan may also help with weight loss. What are tips for following this plan? General guidelines   Avoid eating more than 2,000 mg (milligrams) of salt (sodium) a day. If you have hypertension, you may need to reduce your sodium intake to 1,500 mg a day.  Limit alcohol intake to no more than 1 drink a day for nonpregnant women and 2 drinks a day for men. One drink equals 12 oz of beer, 5 oz of wine, or 1 oz of hard liquor.  Work with your health care provider to maintain a healthy body weight or to lose weight. Ask what an ideal weight is for you.  Get at least 30 minutes of exercise that causes your heart to beat faster (aerobic exercise) most days of the week. Activities may include walking, swimming, or biking.  Work with your health care provider or diet and nutrition specialist (dietitian) to adjust your eating plan to  your individual calorie needs. Reading food labels   Check food labels for the amount of sodium per serving. Choose foods with less than 5 percent of the Daily Value of sodium. Generally, foods with less than 300 mg of sodium per serving fit into this eating plan.  To find whole grains, look for the word "whole" as the first word in the ingredient list. Shopping   Buy products labeled as "low-sodium" or "no salt added."  Buy fresh foods. Avoid canned foods and premade or frozen meals. Cooking   Avoid adding salt when cooking. Use salt-free seasonings or herbs instead of table salt or sea salt. Check with your health care provider or pharmacist before using salt substitutes.  Do not fry foods. Cook foods using healthy methods such as baking, boiling, grilling, and broiling instead.  Cook with heart-healthy oils, such as olive, canola, soybean, or sunflower oil. Meal planning    Eat a balanced diet that includes:  5 or more servings of fruits and vegetables each day. At each meal, try to fill half of your plate with fruits and vegetables.  Up to 6-8 servings of whole grains each day.  Less than 6 oz of lean meat, poultry, or fish each day. A 3-oz serving of meat is about the same size as a deck of cards. One egg equals 1 oz.  2 servings of low-fat dairy each day.  A serving of nuts, seeds, or beans 5 times each week.  Heart-healthy fats. Healthy fats called Omega-3 fatty acids are found in foods  such as flaxseeds and coldwater fish, like sardines, salmon, and mackerel.  Limit how much you eat of the following:  Canned or prepackaged foods.  Food that is high in trans fat, such as fried foods.  Food that is high in saturated fat, such as fatty meat.  Sweets, desserts, sugary drinks, and other foods with added sugar.  Full-fat dairy products.  Do not salt foods before eating.  Try to eat at least 2 vegetarian meals each week.  Eat more home-cooked food and less  restaurant, buffet, and fast food.  When eating at a restaurant, ask that your food be prepared with less salt or no salt, if possible. What foods are recommended? The items listed may not be a complete list. Talk with your dietitian about what dietary choices are best for you. Grains  Whole-grain or whole-wheat bread. Whole-grain or whole-wheat pasta. Brown rice. [place]. Bulgur. Whole-grain and low-sodium cereals. Pita bread. Low-fat, low-sodium crackers. Whole-wheat flour tortillas. Vegetables  Fresh or frozen vegetables (raw, steamed, roasted, or grilled). Low-sodium or reduced-sodium tomato and vegetable juice. Low-sodium or reduced-sodium tomato sauce and tomato paste. Low-sodium or reduced-sodium canned vegetables. Fruits  All fresh, dried, or frozen fruit. Canned fruit in natural juice (without added sugar). Meat and other protein foods  Skinless chicken or [place]. Ground chicken or [place]. Pork with fat trimmed off. Fish and seafood. Egg whites. Dried beans, peas, or lentils. Unsalted nuts, nut butters, and seeds. Unsalted canned beans. Lean cuts of beef with fat trimmed off. Low-sodium, lean deli meat. Dairy  Low-fat (1%) or fat-free (skim) milk. Fat-free, low-fat, or reduced-fat cheeses. Nonfat, low-sodium ricotta or cottage cheese. Low-fat or nonfat yogurt. Low-fat, low-sodium cheese. Fats and oils  Soft margarine without trans fats. Vegetable oil. Low-fat, reduced-fat, or light mayonnaise and salad dressings (reduced-sodium). Canola, safflower, olive, soybean, and sunflower oils. Avocado. Seasoning and other foods  Herbs. Spices. Seasoning mixes without salt. Unsalted popcorn and pretzels. Fat-free sweets. What foods are not recommended? The items listed may not be a complete list. Talk with your dietitian about what dietary choices are best for you. Grains  Baked goods made with fat, such as croissants, muffins, or some breads. Dry pasta or rice meal packs. Vegetables    Creamed or fried vegetables. Vegetables in a cheese sauce. Regular canned vegetables (not low-sodium or reduced-sodium). Regular canned tomato sauce and paste (not low-sodium or reduced-sodium). Regular tomato and vegetable juice (not low-sodium or reduced-sodium). Angie Fava. Olives. Fruits  Canned fruit in a light or heavy syrup. Fried fruit. Fruit in cream or butter sauce. Meat and other protein foods  Fatty cuts of meat. Ribs. Fried meat. Berniece Salines. Sausage. Bologna and other processed lunch meats. Salami. Fatback. Hotdogs. Bratwurst. Salted nuts and seeds. Canned beans with added salt. Canned or smoked fish. Whole eggs or egg yolks. Chicken or [place] with skin. Dairy  Whole or 2% milk, cream, and half-and-half. Whole or full-fat cream cheese. Whole-fat or sweetened yogurt. Full-fat cheese. Nondairy creamers. Whipped toppings. Processed cheese and cheese spreads. Fats and oils  Butter. Stick margarine. Lard. Shortening. Ghee. Bacon fat. Tropical oils, such as coconut, palm kernel, or palm oil. Seasoning and other foods  Salted popcorn and pretzels. Onion salt, garlic salt, seasoned salt, table salt, and sea salt. Worcestershire sauce. Tartar sauce. Barbecue sauce. Teriyaki sauce. Soy sauce, including reduced-sodium. Steak sauce. Canned and packaged gravies. Fish sauce. Oyster sauce. Cocktail sauce. Horseradish that you find on the shelf. Ketchup. Mustard. Meat flavorings and tenderizers. Bouillon cubes. Hot sauce  and Tabasco sauce. Premade or packaged marinades. Premade or packaged taco seasonings. Relishes. Regular salad dressings. Where to find more information:  National Heart, Lung, and [place]: https://wilson-eaton.com/  American Heart Association: www.heart.org Summary  The DASH eating plan is a healthy eating plan that has been shown to reduce high blood pressure (hypertension). It may also reduce your risk for type 2 diabetes, heart disease, and stroke.  With the DASH eating plan, you  should limit salt (sodium) intake to 2,000 mg a day. If you have hypertension, you may need to reduce your sodium intake to 1,500 mg a day.  When on the DASH eating plan, aim to eat more fresh fruits and vegetables, whole grains, lean proteins, low-fat dairy, and heart-healthy fats.  Work with your health care provider or diet and nutrition specialist (dietitian) to adjust your eating plan to your individual calorie needs. This information is not intended to replace advice given to you by your health care provider. Make sure you discuss any questions you have with your health care provider. Document Released: 11/22/2011 Document Revised: 11/26/2016 Document Reviewed: 11/26/2016 Elsevier Interactive Patient Education  2017 [employer].      If you need a refill on your cardiac medications before your next appointment, please call your pharmacy.

## 2017-04-01 NOTE — Progress Notes (Signed)
Cardiology Office Note    Date:  04/01/2017   ID:  Maureen Keller, DOB 10-08-1946, MRN [id number]  PCP:  Kandice Hams, MD  Cardiologist: Dr. Radford Pax  Chief Complaint  Patient presents with  . Hypertension    History of Present Illness:  Maureen Keller is a 71 y.o. 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   Patient had knee replacement in February and has been doing physical therapy. Last week her blood pressure got up to over 180 and they had to stop. She is also had some dyspnea usually with change of position but not necessarily with exertion. She was told to go to the emergency room on 03/29/17 because she's been having some abdominal pain and constipation associated with shortness of breath. CT was negative for pulmonary embolus. Troponins were negative. Her thyroid medications have been adjusted as her TSH got up to over 10. Her blood pressure readings from home have been in the 425Z systolic range. She also feels like she wakes up with a real dry mouth as if she's been sleeping with her mouth open. She does not know she snores. She's never been tested for sleep apnea. She is a remote smoker. Patient sees GI later this month.    Past Medical History:  Diagnosis Date  . Aortic stenosis 11/2015   Severe AS s/p Bioprosthetic AVR  . Arthritis   . Chronic kidney disease (CKD), stage III (moderate)   . Complication of anesthesia    awakened durimg procedure- heart cath 11/2015.  woke up during buninectomy  . Constipation   . Depression    PTSD  . Dyspnea   . GERD (gastroesophageal reflux disease)   . Glaucoma   . Hyperlipidemia   . Hypertension   . Hypothyroidism   . Mitral valve disorder    Mild to moderate MR by echo 03/2016  . Pulmonary hypertension (HCC)    mild with PASP 69mmHg by echo 03/2016    Past Surgical History:    Procedure Laterality Date  . AORTIC VALVE REPLACEMENT N/A 12/16/2015   Procedure: AORTIC VALVE REPLACEMENT (AVR);  Surgeon: Melrose Nakayama, MD;  Location: [facility];  Service: Open Heart Surgery;  Laterality: N/A;  Edwards Intuity Elite Aortic Valve 81mm  . BUNIONECTOMY Right   . CARDIAC CATHETERIZATION  11/04/2009   normal coronaries  . CARDIAC CATHETERIZATION N/A 11/30/2015   Procedure: Right/Left Heart Cath and Coronary Angiography;  Surgeon: Burnell Blanks, MD;  Location: [facility];  Service: Cardiovascular;  Laterality: N/A;  . CARPAL TUNNEL RELEASE Right 1982  . CARPAL TUNNEL RELEASE Left   . CATARACT EXTRACTION Bilateral   . COLONOSCOPY    . TEE WITHOUT CARDIOVERSION N/A 12/16/2015   Procedure: TRANSESOPHAGEAL ECHOCARDIOGRAM (TEE);  Surgeon: Melrose Nakayama, MD;  Location: [facility];  Service: Open Heart Surgery;  Laterality: N/A;  . TOE SURGERY Right    2 nd toe  . TOTAL KNEE ARTHROPLASTY Right 10/09/2016   Procedure: TOTAL RIGHT KNEE ARTHROPLASTY;  Surgeon: Melrose Nakayama, MD;  Location: [facility];  Service: Orthopedics;  Laterality: Right;  . TOTAL KNEE ARTHROPLASTY Left 01/29/2017   Procedure: LEFT TOTAL KNEE ARTHROPLASTY;  Surgeon: Melrose Nakayama, MD;  Location: [facility];  Service: Orthopedics;  Laterality: Left;  . TRIGGER FINGER RELEASE Bilateral    thumbs - 2 separtate surgeries  Current Medications: Outpatient Medications Prior to Visit  Medication Sig Dispense Refill  . aspirin EC 81 MG tablet Take 81 mg by mouth daily.    . Cholecalciferol (VITAMIN D-3) 5000 units TABS Take 5,000 Units by mouth daily.    Marland Kitchen docusate sodium (COLACE) 100 MG capsule Take 300 mg by mouth daily.     . furosemide (LASIX) 20 MG tablet Take 1 tablet (20 mg total) by mouth daily as needed. Take daily as needed for shortness of breath (Patient taking differently: Take 20 mg by mouth daily as needed (shortness of breath/fluid retention.). ) 30 tablet 11  . lamoTRIgine (LAMICTAL)  100 MG tablet Take 100-200 mg by mouth 2 (two) times daily. 200mg  in the morning and 100mg  in the evening    . latanoprost (XALATAN) 0.005 % ophthalmic solution Place 1 drop into both eyes at bedtime.    Marland Kitchen levothyroxine (SYNTHROID, LEVOTHROID) 137 MCG tablet Take 137 mcg by mouth daily before breakfast.    . metoprolol succinate (TOPROL-XL) 25 MG 24 hr tablet Take 1 tablet (25 mg total) by mouth daily. 90 tablet 3  . omeprazole (PRILOSEC) 20 MG capsule Take 20 mg by mouth at bedtime.     . traZODone (DESYREL) 100 MG tablet Take 300 mg by mouth at bedtime.     Marland Kitchen losartan (COZAAR) 50 MG tablet Take 1 tablet (50 mg total) by mouth daily. 30 tablet 10  . albuterol (PROVENTIL HFA;VENTOLIN HFA) 108 (90 Base) MCG/ACT inhaler Inhale 2 puffs into the lungs as needed for wheezing or shortness of breath.    Marland Kitchen aspirin 325 MG EC tablet Take 1 tablet (325 mg total) by mouth 2 (two) times daily after a meal. (Patient not taking: Reported on 03/20/2017) 30 tablet 0  . HYDROcodone-acetaminophen (NORCO/VICODIN) 5-325 MG tablet Take 1-2 tablets by mouth every 4 (four) hours as needed (breakthrough pain). (Patient not taking: Reported on 03/20/2017) 40 tablet 0  . methocarbamol (ROBAXIN) 500 MG tablet Take 1 tablet (500 mg total) by mouth every 6 (six) hours as needed for muscle spasms. (Patient not taking: Reported on 03/20/2017) 40 tablet 0  . Polyethyl Glycol-Propyl Glycol (LUBRICANT EYE DROPS) 0.4-0.3 % SOLN Place 1-2 drops into both eyes 3 (three) times daily as needed (for dry/irritated eyes.).     No facility-administered medications prior to visit.      Allergies:   Patient has no known allergies.   Social History   Social History  . Marital status: Single    Spouse name: N/A  . Number of children: N/A  . Years of education: N/A   Social History Main Topics  . Smoking status: Former Smoker    Years: 12.00    Quit date: 10/29/1976  . Smokeless tobacco: Never Used  . Alcohol use Yes     Comment: Occ  .  Drug use: No  . Sexual activity: Not Asked   Other Topics Concern  . None   Social History Narrative  . None     Family History:  The patient's   family history includes Diabetes in her father and mother; Heart disease in her father; Hypertension in her father; Suicidality (age of onset: 56) in her mother.   ROS:   Please see the history of present illness.    Review of Systems  Constitution: Positive for weakness.  HENT: Negative.   Eyes: Negative.   Cardiovascular: Negative.   Respiratory: Positive for shortness of breath and snoring.   Hematologic/Lymphatic: Negative.   Musculoskeletal:  Positive for arthritis and joint pain.  Gastrointestinal: Negative.   Genitourinary: Negative.    All other systems reviewed and are negative.   PHYSICAL EXAM:   VS:  BP 130/78 (BP Location: Right Arm, Patient Position: Sitting, Cuff Size: Normal)   Pulse 74   Ht 5\' 4"  (1.626 m)   Wt 189 lb 12.8 oz (86.1 kg)   SpO2 97%   BMI 32.58 kg/m   Physical Exam  GEN: Obese, in no acute distress  Neck: no JVD, carotid bruits, or masses Cardiac:RRR; 2/6 systolic murmur at the left sternal border and right sternal border, 2/6 systolic murmur at the apex Respiratory:  clear to auscultation bilaterally, normal work of breathing GI: soft, nontender, nondistended, + BS Ext: without cyanosis, clubbing, or edema, Good distal pulses bilaterally Psych: euthymic mood, full affect  Wt Readings from Last 3 Encounters:  04/01/17 189 lb 12.8 oz (86.1 kg)  01/29/17 194 lb (88 kg)  01/21/17 194 lb (88 kg)      Studies/Labs Reviewed:   EKG:  EKG is not ordered today.    Recent Labs: 03/20/2017: ALT 18; BUN 13; Creatinine, Ser 0.70; Hemoglobin 14.5; Platelets 239; Potassium 4.0; Sodium 136   Lipid Panel No results found for: CHOL, TRIG, HDL, CHOLHDL, VLDL, LDLCALC, LDLDIRECT  Additional studies/ records that were reviewed today include:  2-D echo 11/2015 Study Conclusions  Study Conclusions   -  Left ventricle: The cavity size was normal. Wall thickness was   normal. Systolic function was normal. The estimated ejection   fraction was in the range of 60% to 65%. Wall motion was normal;   there were no regional wall motion abnormalities. Features are   consistent with a pseudonormal left ventricular filling pattern,   with concomitant abnormal relaxation and increased filling   pressure (grade 2 diastolic dysfunction). - Aortic valve: A bioprosthesis was present and functioning   normally. Mean gradient (S): 19 mm Hg. Peak gradient (S): 35 mm   Hg. - Mitral valve: There was mild to moderate regurgitation directed   centrally. - Left atrium: The atrium was mildly dilated. - Tricuspid valve: There was mild-moderate regurgitation directed   centrally. - Pulmonary arteries: Systolic pressure was mildly increased. PA   peak pressure: 36 mm Hg (S).       ASSESSMENT:    1. Mitral valve disorder   2. S/P AVR (aortic valve replacement)   3. Essential hypertension   4. Pulmonary HTN ([place])   5. Snoring      PLAN:  In order of problems listed above:  Essential hypertension patient brings a list of blood pressures and they have all been high. She used to take losartan 100 mg daily but this was decreased after her valve replacement. Increase losartan to 100 mg daily, 2 g sodium diet, return to physical therapy, follow-up with Dr. Radford Pax in 4-6 weeks.  Mitral valve disorder patient had 2-D echo this morning and will be reviewed and she will be contacted with results. I doubt that this is the cause of her dyspnea  Status post aortic valve replacement with bioprosthetic valve in 2016. She did have a mild gradient across a bioprosthesis and has a murmur most likely due to the small prosthesis. She is on aspirin. 2-D echo was done this morning but is not read yet. Follow-up with Dr. Radford Pax  Pulmonary hypertension mild PAS P 36 mmHg by echo in 03/2016  Snoring, waking up with dry mouth  and dyspnea will schedule for sleep apnea  test.     Medication Adjustments/Labs and Tests Ordered: Current medicines are reviewed at length with the patient today.  Concerns regarding medicines are outlined above.  Medication changes, Labs and Tests ordered today are listed in the Patient Instructions below. Patient Instructions  Medication Instructions:  Your physician has recommended you make the following change in your medication: INCREASE Losartan 100mg  daily. An Rx has been sent to your pharmacy.  Labwork: None ordered  Testing/Procedures: Your physician has recommended that you have a sleep study. This test records several body functions during sleep, including: brain activity, eye movement, oxygen and carbon dioxide blood levels, heart rate and rhythm, breathing rate and rhythm, the flow of air through your mouth and nose, snoring, body muscle movements, and chest and belly movement.  Gershon Cull, CMA will call you to schedule  Follow-Up: Your physician recommends that you schedule a follow-up appointment in: 4-6 week with Dr.[facility]   Any Other Special Instructions Will Be Listed Below (If Applicable).    DASH Eating Plan DASH stands for "Dietary Approaches to Stop Hypertension." The DASH eating plan is a healthy eating plan that has been shown to reduce high blood pressure (hypertension). It may also reduce your risk for type 2 diabetes, heart disease, and stroke. The DASH eating plan may also help with weight loss. What are tips for following this plan? General guidelines   Avoid eating more than 2,000 mg (milligrams) of salt (sodium) a day. If you have hypertension, you may need to reduce your sodium intake to 1,500 mg a day.  Limit alcohol intake to no more than 1 drink a day for nonpregnant women and 2 drinks a day for men. One drink equals 12 oz of beer, 5 oz of wine, or 1 oz of hard liquor.  Work with your health care provider to maintain a healthy body weight or  to lose weight. Ask what an ideal weight is for you.  Get at least 30 minutes of exercise that causes your heart to beat faster (aerobic exercise) most days of the week. Activities may include walking, swimming, or biking.  Work with your health care provider or diet and nutrition specialist (dietitian) to adjust your eating plan to your individual calorie needs. Reading food labels   Check food labels for the amount of sodium per serving. Choose foods with less than 5 percent of the Daily Value of sodium. Generally, foods with less than 300 mg of sodium per serving fit into this eating plan.  To find whole grains, look for the word "whole" as the first word in the ingredient list. Shopping   Buy products labeled as "low-sodium" or "no salt added."  Buy fresh foods. Avoid canned foods and premade or frozen meals. Cooking   Avoid adding salt when cooking. Use salt-free seasonings or herbs instead of table salt or sea salt. Check with your health care provider or pharmacist before using salt substitutes.  Do not fry foods. Cook foods using healthy methods such as baking, boiling, grilling, and broiling instead.  Cook with heart-healthy oils, such as olive, canola, soybean, or sunflower oil. Meal planning    Eat a balanced diet that includes:  5 or more servings of fruits and vegetables each day. At each meal, try to fill half of your plate with fruits and vegetables.  Up to 6-8 servings of whole grains each day.  Less than 6 oz of lean meat, poultry, or fish each day. A 3-oz serving of meat is about the  same size as a deck of cards. One egg equals 1 oz.  2 servings of low-fat dairy each day.  A serving of nuts, seeds, or beans 5 times each week.  Heart-healthy fats. Healthy fats called Omega-3 fatty acids are found in foods such as flaxseeds and coldwater fish, like sardines, salmon, and mackerel.  Limit how much you eat of the following:  Canned or prepackaged foods.  Food  that is high in trans fat, such as fried foods.  Food that is high in saturated fat, such as fatty meat.  Sweets, desserts, sugary drinks, and other foods with added sugar.  Full-fat dairy products.  Do not salt foods before eating.  Try to eat at least 2 vegetarian meals each week.  Eat more home-cooked food and less restaurant, buffet, and fast food.  When eating at a restaurant, ask that your food be prepared with less salt or no salt, if possible. What foods are recommended? The items listed may not be a complete list. Talk with your dietitian about what dietary choices are best for you. Grains  Whole-grain or whole-wheat bread. Whole-grain or whole-wheat pasta. Brown rice. [place]ena Morrow. Bulgur. Whole-grain and low-sodium cereals. Pita bread. Low-fat, low-sodium crackers. Whole-wheat flour tortillas. Vegetables  Fresh or frozen vegetables (raw, steamed, roasted, or grilled). Low-sodium or reduced-sodium tomato and vegetable juice. Low-sodium or reduced-sodium tomato sauce and tomato paste. Low-sodium or reduced-sodium canned vegetables. Fruits  All fresh, dried, or frozen fruit. Canned fruit in natural juice (without added sugar). Meat and other protein foods  Skinless chicken or [place]. Ground chicken or [place]. Pork with fat trimmed off. Fish and seafood. Egg whites. Dried beans, peas, or lentils. Unsalted nuts, nut butters, and seeds. Unsalted canned beans. Lean cuts of beef with fat trimmed off. Low-sodium, lean deli meat. Dairy  Low-fat (1%) or fat-free (skim) milk. Fat-free, low-fat, or reduced-fat cheeses. Nonfat, low-sodium ricotta or cottage cheese. Low-fat or nonfat yogurt. Low-fat, low-sodium cheese. Fats and oils  Soft margarine without trans fats. Vegetable oil. Low-fat, reduced-fat, or light mayonnaise and salad dressings (reduced-sodium). Canola, safflower, olive, soybean, and sunflower oils. Avocado. Seasoning and other foods  Herbs. Spices. Seasoning mixes  without salt. Unsalted popcorn and pretzels. Fat-free sweets. What foods are not recommended? The items listed may not be a complete list. Talk with your dietitian about what dietary choices are best for you. Grains  Baked goods made with fat, such as croissants, muffins, or some breads. Dry pasta or rice meal packs. Vegetables  Creamed or fried vegetables. Vegetables in a cheese sauce. Regular canned vegetables (not low-sodium or reduced-sodium). Regular canned tomato sauce and paste (not low-sodium or reduced-sodium). Regular tomato and vegetable juice (not low-sodium or reduced-sodium). Angie Fava. Olives. Fruits  Canned fruit in a light or heavy syrup. Fried fruit. Fruit in cream or butter sauce. Meat and other protein foods  Fatty cuts of meat. Ribs. Fried meat. Berniece Salines. Sausage. Bologna and other processed lunch meats. Salami. Fatback. Hotdogs. Bratwurst. Salted nuts and seeds. Canned beans with added salt. Canned or smoked fish. Whole eggs or egg yolks. Chicken or [place] with skin. Dairy  Whole or 2% milk, cream, and half-and-half. Whole or full-fat cream cheese. Whole-fat or sweetened yogurt. Full-fat cheese. Nondairy creamers. Whipped toppings. Processed cheese and cheese spreads. Fats and oils  Butter. Stick margarine. Lard. Shortening. Ghee. Bacon fat. Tropical oils, such as coconut, palm kernel, or palm oil. Seasoning and other foods  Salted popcorn and pretzels. Onion salt, garlic salt, seasoned salt, table salt,  and sea salt. Worcestershire sauce. Tartar sauce. Barbecue sauce. Teriyaki sauce. Soy sauce, including reduced-sodium. Steak sauce. Canned and packaged gravies. Fish sauce. Oyster sauce. Cocktail sauce. Horseradish that you find on the shelf. Ketchup. Mustard. Meat flavorings and tenderizers. Bouillon cubes. Hot sauce and Tabasco sauce. Premade or packaged marinades. Premade or packaged taco seasonings. Relishes. Regular salad dressings. Where to find more information:  National  Heart, Lung, and [place]: https://wilson-eaton.com/  American Heart Association: www.heart.org Summary  The DASH eating plan is a healthy eating plan that has been shown to reduce high blood pressure (hypertension). It may also reduce your risk for type 2 diabetes, heart disease, and stroke.  With the DASH eating plan, you should limit salt (sodium) intake to 2,000 mg a day. If you have hypertension, you may need to reduce your sodium intake to 1,500 mg a day.  When on the DASH eating plan, aim to eat more fresh fruits and vegetables, whole grains, lean proteins, low-fat dairy, and heart-healthy fats.  Work with your health care provider or diet and nutrition specialist (dietitian) to adjust your eating plan to your individual calorie needs. This information is not intended to replace advice given to you by your health care provider. Make sure you discuss any questions you have with your health care provider. Document Released: 11/22/2011 Document Revised: 11/26/2016 Document Reviewed: 11/26/2016 Elsevier Interactive Patient Education  2017 [employer].      If you need a refill on your cardiac medications before your next appointment, please call your pharmacy.      Sumner Boast, PA-C  04/01/2017 12:55 PM    [facility] [address], [place], [place]  [address] Phone: [phone]; Fax: [phone]

## 2017-04-02 ENCOUNTER — Telehealth: Payer: Self-pay

## 2017-04-02 DIAGNOSIS — M25562 Pain in left knee: Secondary | ICD-10-CM | POA: Diagnosis not present

## 2017-04-02 DIAGNOSIS — M25662 Stiffness of left knee, not elsewhere classified: Secondary | ICD-10-CM | POA: Diagnosis not present

## 2017-04-02 DIAGNOSIS — Z96652 Presence of left artificial knee joint: Secondary | ICD-10-CM | POA: Diagnosis not present

## 2017-04-02 DIAGNOSIS — I272 Pulmonary hypertension, unspecified: Secondary | ICD-10-CM

## 2017-04-02 DIAGNOSIS — R0602 Shortness of breath: Secondary | ICD-10-CM

## 2017-04-02 NOTE — Telephone Encounter (Signed)
Informed patient of results and verbal understanding expressed.  Repeat ECHO ordered to be scheduled in 1 year. Patient states in the last 3-4 weeks she has noticed some mild SOB. Most of the time it occurs during exertion (mild exercises with PT, for example), but has occasionally occurred while simply standing.  She has no pulmonary doctor.  She reports no swelling or other symptoms. She is taking medications as directed. She understands she will be called with Dr. Theodosia Blender recommendations.

## 2017-04-02 NOTE — Telephone Encounter (Signed)
-----   Message from Sueanne Margarita, MD sent at 04/02/2017 10:38 AM EDT ----- PASP only mildly elevated - repeat echo in 1 eyar

## 2017-04-03 ENCOUNTER — Telehealth: Payer: Self-pay | Admitting: *Deleted

## 2017-04-03 NOTE — Telephone Encounter (Signed)
She had scarring on Chest CT.  Please refer to pulmonary and have her come in for BNP

## 2017-04-03 NOTE — Telephone Encounter (Signed)
Left message to call back  

## 2017-04-03 NOTE — Telephone Encounter (Signed)
Called the patient with her sleep study date and she verbalized understanding. She understands she will receive a packet in the mail from the sleep lab with the time and directions. She understands to call if the sleep lab does not contact her in a timley manner. She agrees with the treatment plan and thanked me.

## 2017-04-04 DIAGNOSIS — H401231 Low-tension glaucoma, bilateral, mild stage: Secondary | ICD-10-CM | POA: Diagnosis not present

## 2017-04-04 NOTE — Telephone Encounter (Signed)
Left message to call back  

## 2017-04-04 NOTE — Telephone Encounter (Signed)
Follow up    Pt is returning call to [facility].

## 2017-04-04 NOTE — Telephone Encounter (Signed)
Pulmonary referral placed. BNP scheduled next Wednesday. Patient agrees with treatment plan and was grateful for call.

## 2017-04-05 DIAGNOSIS — E039 Hypothyroidism, unspecified: Secondary | ICD-10-CM | POA: Diagnosis not present

## 2017-04-05 NOTE — Telephone Encounter (Signed)
Pulmonary OV has been scheduled 5/18.

## 2017-04-10 ENCOUNTER — Other Ambulatory Visit: Payer: Medicare Other

## 2017-04-11 DIAGNOSIS — K5900 Constipation, unspecified: Secondary | ICD-10-CM | POA: Diagnosis not present

## 2017-04-11 DIAGNOSIS — R194 Change in bowel habit: Secondary | ICD-10-CM | POA: Diagnosis not present

## 2017-04-12 DIAGNOSIS — Z96652 Presence of left artificial knee joint: Secondary | ICD-10-CM | POA: Diagnosis not present

## 2017-04-12 DIAGNOSIS — M25662 Stiffness of left knee, not elsewhere classified: Secondary | ICD-10-CM | POA: Diagnosis not present

## 2017-04-12 DIAGNOSIS — M25562 Pain in left knee: Secondary | ICD-10-CM | POA: Diagnosis not present

## 2017-04-19 DIAGNOSIS — Z96652 Presence of left artificial knee joint: Secondary | ICD-10-CM | POA: Diagnosis not present

## 2017-04-19 DIAGNOSIS — M25662 Stiffness of left knee, not elsewhere classified: Secondary | ICD-10-CM | POA: Diagnosis not present

## 2017-04-19 DIAGNOSIS — M25562 Pain in left knee: Secondary | ICD-10-CM | POA: Diagnosis not present

## 2017-04-26 DIAGNOSIS — Z96652 Presence of left artificial knee joint: Secondary | ICD-10-CM | POA: Diagnosis not present

## 2017-04-26 DIAGNOSIS — M25562 Pain in left knee: Secondary | ICD-10-CM | POA: Diagnosis not present

## 2017-04-26 DIAGNOSIS — M25662 Stiffness of left knee, not elsewhere classified: Secondary | ICD-10-CM | POA: Diagnosis not present

## 2017-04-29 DIAGNOSIS — M25562 Pain in left knee: Secondary | ICD-10-CM | POA: Diagnosis not present

## 2017-04-29 NOTE — Progress Notes (Addendum)
Cardiology Office Note    Date:  04/30/2017   ID:  Maureen Keller, DOB 12-29-1945, MRN [id number]  PCP:  Seward Tomorrow, MD  Cardiologist:  Fransico Him, MD   Chief Complaint  Patient presents with  . Aortic Stenosis  . Mitral Regurgitation  . Hypertension    History of Present Illness:  Maureen Keller is a 71 y.o. 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    She presents today for followup and  is doing well from a cardiac standpoint. She denies anychest pain or pressure,  dizziness, palpitations or syncope.  She has been having some SOB with exertion and even with moving from sitting to standing.  This occurs intermittently and does not occur all the time. This seemed to start after her knee replacement.  She has chronic LE edema in the left leg since her TKR.  She denies any PND or orthopnea.    Past Medical History:  Diagnosis Date  . Aortic stenosis 11/2015   Severe AS s/p Bioprosthetic AVR  . Arthritis   . Chronic kidney disease (CKD), stage III (moderate)   . Complication of anesthesia    awakened durimg procedure- heart cath 11/2015.  woke up during buninectomy  . Constipation   . Depression    PTSD  . Dyspnea   . GERD (gastroesophageal reflux disease)   . Glaucoma   . Hyperlipidemia   . Hypertension   . Hypothyroidism   . Mitral valve disorder    Mild to moderate MR by echo 03/2017 - no change from prior echo  . Pulmonary hypertension (HCC)    mild with PASP 66mmHg by echo 03/2017    Past Surgical History:  Procedure Laterality Date  . AORTIC VALVE REPLACEMENT N/A 12/16/2015   Procedure: AORTIC VALVE REPLACEMENT (AVR);  Surgeon: Melrose Nakayama, MD;  Location: [facility];  Service: Open Heart Surgery;  Laterality: N/A;  Edwards Intuity Elite Aortic Valve 53mm  . BUNIONECTOMY Right   . CARDIAC CATHETERIZATION   11/04/2009   normal coronaries  . CARDIAC CATHETERIZATION N/A 11/30/2015   Procedure: Right/Left Heart Cath and Coronary Angiography;  Surgeon: Burnell Blanks, MD;  Location: [facility];  Service: Cardiovascular;  Laterality: N/A;  . CARPAL TUNNEL RELEASE Right 1982  . CARPAL TUNNEL RELEASE Left   . CATARACT EXTRACTION Bilateral   . COLONOSCOPY    . TEE WITHOUT CARDIOVERSION N/A 12/16/2015   Procedure: TRANSESOPHAGEAL ECHOCARDIOGRAM (TEE);  Surgeon: Melrose Nakayama, MD;  Location: [facility] [place];  Service: Open Heart Surgery;  Laterality: N/A;  . TOE SURGERY Right    2 nd toe  . TOTAL KNEE ARTHROPLASTY Right 10/09/2016   Procedure: TOTAL RIGHT KNEE ARTHROPLASTY;  Surgeon: Melrose Nakayama, MD;  Location: [place] [facility];  Service: Orthopedics;  Laterality: Right;  . TOTAL KNEE ARTHROPLASTY Left 01/29/2017   Procedure: LEFT TOTAL KNEE ARTHROPLASTY;  Surgeon: Melrose Nakayama, MD;  Location: [place];  Service: Orthopedics;  Laterality: Left;  . TRIGGER FINGER RELEASE Bilateral    thumbs - 2 separtate surgeries    Current Medications: Current Meds  Medication Sig  . aspirin EC 81 MG tablet Take 81 mg by mouth daily.  . Cholecalciferol (VITAMIN D-3) 5000 units TABS Take 5,000 Units by mouth daily.  Marland Kitchen docusate sodium (COLACE) 100 MG capsule Take 300 mg by mouth daily.   . furosemide (  LASIX) 20 MG tablet Take 1 tablet (20 mg total) by mouth daily as needed. Take daily as needed for shortness of breath (Patient taking differently: Take 20 mg by mouth daily as needed (shortness of breath/fluid retention.). )  . lamoTRIgine (LAMICTAL) 100 MG tablet Take 100-200 mg by mouth 2 (two) times daily. 200mg  in the morning and 100mg  in the evening  . latanoprost (XALATAN) 0.005 % ophthalmic solution Place 1 drop into both eyes at bedtime.  Marland Kitchen levothyroxine (SYNTHROID, LEVOTHROID) 137 MCG tablet Take 137 mcg by mouth daily before breakfast.  . losartan (COZAAR) 100 MG tablet Take 1 tablet (100 mg total) by  mouth daily.  . metoprolol succinate (TOPROL-XL) 25 MG 24 hr tablet Take 1 tablet (25 mg total) by mouth daily.  Marland Kitchen omeprazole (PRILOSEC) 20 MG capsule Take 20 mg by mouth at bedtime.   . traZODone (DESYREL) 100 MG tablet Take 300 mg by mouth at bedtime.     Allergies:   Patient has no known allergies.   Social History   Social History  . Marital status: Single    Spouse name: N/A  . Number of children: N/A  . Years of education: N/A   Social History Main Topics  . Smoking status: Former Smoker    Years: 12.00    Quit date: 10/29/1976  . Smokeless tobacco: Never Used  . Alcohol use Yes     Comment: Occ  . Drug use: No  . Sexual activity: Not Asked   Other Topics Concern  . None   Social History Narrative  . None     Family History:  The patient's family history includes Diabetes in her father and mother; Heart disease in her father; Hypertension in her father; Suicidality (age of onset: 38) in her mother.   ROS:   Please see the history of present illness.    ROS All other systems reviewed and are negative.  No flowsheet data found.     PHYSICAL EXAM:   VS:  BP 118/80   Pulse 64   Ht 5\' 4"  (1.626 m)   Wt 190 lb 1.9 oz (86.2 kg)   SpO2 97%   BMI 32.63 kg/m    GEN: Well nourished, well developed, in no acute distress  HEENT: normal  Neck: no JVD, carotid bruits, or masses Cardiac: RRR; no  rubs, or gallops,no edema.  Intact distal pulses bilaterally. 2/6 SM at RUSB  Respiratory:  clear to auscultation bilaterally, normal work of breathing GI: soft, nontender, nondistended, + BS MS: no deformity or atrophy  Skin: warm and dry, no rash Neuro:  Alert and Oriented x 3, Strength and sensation are intact Psych: euthymic mood, full affect  Wt Readings from Last 3 Encounters:  04/30/17 190 lb 1.9 oz (86.2 kg)  04/01/17 189 lb 12.8 oz (86.1 kg)  01/29/17 194 lb (88 kg)      Studies/Labs Reviewed:   EKG:  EKG is not ordered today.   Recent Labs: 03/20/2017:  ALT 18; BUN 13; Creatinine, Ser 0.70; Hemoglobin 14.5; Platelets 239; Potassium 4.0; Sodium 136   Lipid Panel No results found for: CHOL, TRIG, HDL, CHOLHDL, VLDL, LDLCALC, LDLDIRECT  Additional studies/ records that were reviewed today include:  none    ASSESSMENT:    1. Aortic valve stenosis, etiology of cardiac valve disease unspecified   2. Mitral valve disorder   3. Pulmonary HTN ([place])   4. Benign essential HTN      PLAN:  In order of problems listed  above:  1. Severe AS/AR s/p bioprosthetic AVR.  She will continue on ASA 81mg  daily.  2. Mild to moderate MR  3. 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  She will continue on diuretics.   4. HTN - Her BP is controlled on exam today. She says that her BP has been very erratic and has been as high as 269mmHg.  She will continue on metoprolol succinate 25mg  daily and  Losartan 100mg  daily.  I have asked her to check her BP twice daily for a week and call with results.   5. SOB - this has occurred since her TKR so need to consider possible acute PE.  Her PASP is slightly higher this time than a year ago.  I will get a VQ scan to rule out PE. She does not appear volume overloaded on exam but I will get a BNP.  This also could be due to erratically high BP.  I have asked her to check her BP twice daily for a week and call with the results.  She had normal coronary arteries by cath prior to AVR but she could have ischemia from obstruction of coronary arteries from AVR placement.  I will get a nuclear stress test to rule out ischemia.  I will check a TSH and CBC as well.  If workup is normal then would assume that her SOB is likely due to deconditioning from recent TKR.    Medication Adjustments/Labs and Tests Ordered: Current medicines are reviewed at length with the patient today.  Concerns regarding medicines are outlined above.  Medication changes, Labs and Tests ordered today  are listed in the Patient Instructions below.  There are no Patient Instructions on file for this visit.   Signed, Fransico Him, MD  04/30/2017 8:48 AM    [address], [place], [place]  [address] Phone: [phone]; Fax: [phone]

## 2017-04-30 ENCOUNTER — Ambulatory Visit (INDEPENDENT_AMBULATORY_CARE_PROVIDER_SITE_OTHER): Payer: Medicare Other | Admitting: Cardiology

## 2017-04-30 ENCOUNTER — Encounter: Payer: Self-pay | Admitting: Cardiology

## 2017-04-30 VITALS — BP 118/80 | HR 64 | Ht 64.0 in | Wt 190.1 lb

## 2017-04-30 DIAGNOSIS — I10 Essential (primary) hypertension: Secondary | ICD-10-CM

## 2017-04-30 DIAGNOSIS — I272 Pulmonary hypertension, unspecified: Secondary | ICD-10-CM | POA: Diagnosis not present

## 2017-04-30 DIAGNOSIS — I350 Nonrheumatic aortic (valve) stenosis: Secondary | ICD-10-CM | POA: Diagnosis not present

## 2017-04-30 DIAGNOSIS — R0602 Shortness of breath: Secondary | ICD-10-CM | POA: Diagnosis not present

## 2017-04-30 DIAGNOSIS — I059 Rheumatic mitral valve disease, unspecified: Secondary | ICD-10-CM

## 2017-04-30 LAB — BASIC METABOLIC PANEL
BUN/Creatinine Ratio: 13 (ref 12–28)
BUN: 11 mg/dL (ref 8–27)
CHLORIDE: 98 mmol/L (ref 96–106)
CO2: 22 mmol/L (ref 18–29)
Calcium: 10.3 mg/dL (ref 8.7–10.3)
Creatinine, Ser: 0.85 mg/dL (ref 0.57–1.00)
GFR calc non Af Amer: 69 mL/min/{1.73_m2} (ref >59)
GFR, EST AFRICAN AMERICAN: 80 mL/min/{1.73_m2} (ref >59)
Glucose: 100 mg/dL — ABNORMAL HIGH (ref 65–99)
Potassium: 4.8 mmol/L (ref 3.5–5.2)
Sodium: 137 mmol/L (ref 134–144)

## 2017-04-30 LAB — CBC WITH DIFFERENTIAL/PLATELET
BASOS ABS: 0 10*3/uL (ref 0.0–0.2)
Basos: 1 %
EOS (ABSOLUTE): 0.3 10*3/uL (ref 0.0–0.4)
Eos: 6 %
HEMOGLOBIN: 15.4 g/dL (ref 11.1–15.9)
Hematocrit: 44.5 % (ref 34.0–46.6)
IMMATURE GRANS (ABS): 0 10*3/uL (ref 0.0–0.1)
IMMATURE GRANULOCYTES: 0 %
LYMPHS: 35 %
Lymphocytes Absolute: 1.7 10*3/uL (ref 0.7–3.1)
MCH: 31.7 pg (ref 26.6–33.0)
MCHC: 34.6 g/dL (ref 31.5–35.7)
MCV: 92 fL (ref 79–97)
MONOCYTES: 15 %
Monocytes Absolute: 0.7 10*3/uL (ref 0.1–0.9)
NEUTROS ABS: 2.2 10*3/uL (ref 1.4–7.0)
NEUTROS PCT: 43 %
PLATELETS: 259 10*3/uL (ref 150–379)
RBC: 4.86 x10E6/uL (ref 3.77–5.28)
RDW: 13.5 % (ref 12.3–15.4)
WBC: 4.9 10*3/uL (ref 3.4–10.8)

## 2017-04-30 LAB — TSH: TSH: 2.84 u[IU]/mL (ref 0.450–4.500)

## 2017-04-30 LAB — PRO B NATRIURETIC PEPTIDE: NT-PRO BNP: 182 pg/mL (ref 0–301)

## 2017-04-30 NOTE — Patient Instructions (Signed)
Medication Instructions:  Your physician recommends that you continue on your current medications as directed. Please refer to the Current Medication list given to you today.   Labwork: TODAY: BMET, CBC, TSH, BNP  Testing/Procedures: Dr. Radford Pax recommends you have a VQ SCAN.  Dr. Radford Pax recommends you have an [facility].  Follow-Up: Your physician wants you to follow-up in: 6 months with Dr. Radford Pax. You will receive a reminder letter in the mail two months in advance. If you don't receive a letter, please call our office to schedule the follow-up appointment.   Any Other Special Instructions Will Be Listed Below (If Applicable).     If you need a refill on your cardiac medications before your next appointment, please call your pharmacy.

## 2017-05-01 ENCOUNTER — Telehealth (HOSPITAL_COMMUNITY): Payer: Self-pay | Admitting: *Deleted

## 2017-05-01 IMAGING — CT CT ANGIO CHEST
2 of 10 series · 17 of 46 positions shown · IV contrast (ISOVUE 370)
Comparison: Chest CT 03/29/2016

CLINICAL DATA: Dyspnea, nausea, and constipation x1 week. Elevated
D-dimer with microscopic hematuria. Aortic valvular replacement,
hypertension and mitral valve disorder. Chronic renal disease stage
III.



[Series 6: thins for pacs · axial · 0.66mm/px · z∈[-60,+126]mm · 15 of 214 slices shown]
[im 14/214  lung]
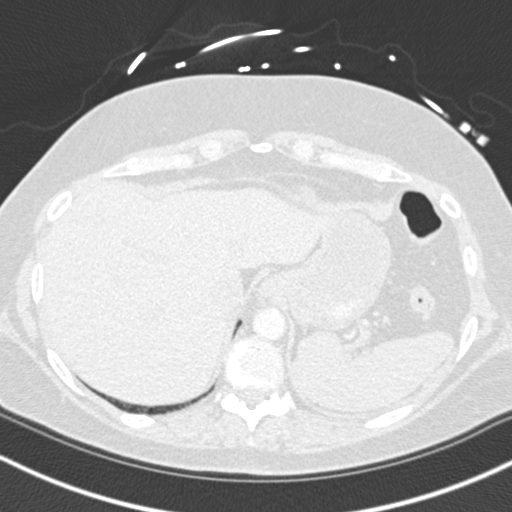
[im 27/214  soft-tissue]
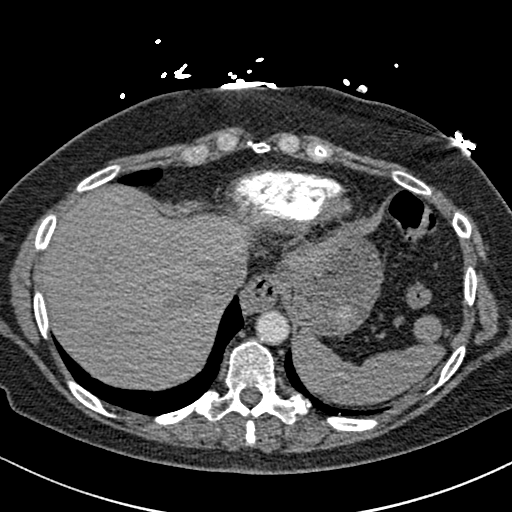
[im 40/214  lung]
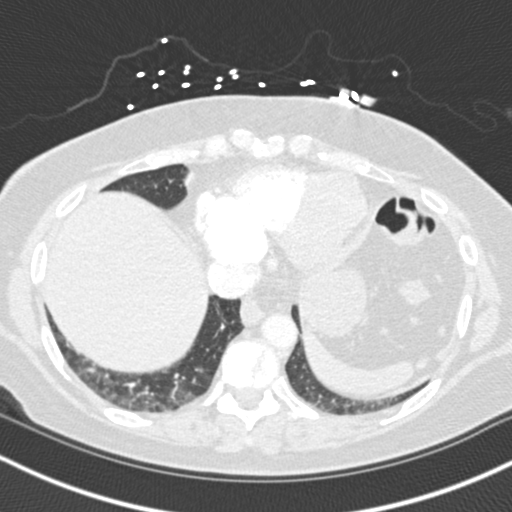
[im 54/214  soft-tissue]
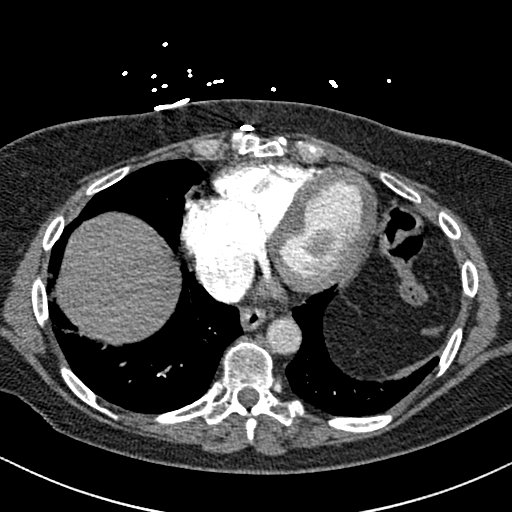
[im 67/214  lung]
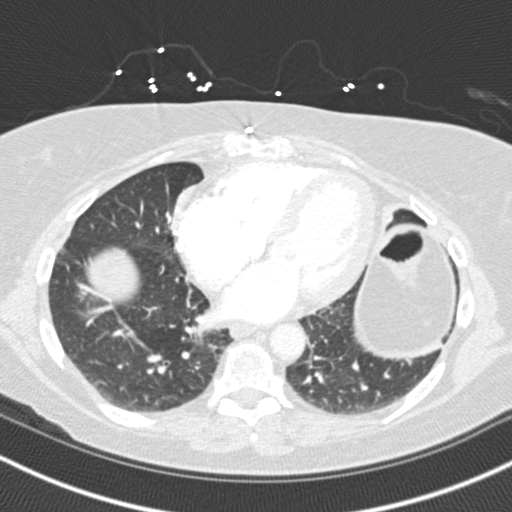
[im 80/214  soft-tissue]
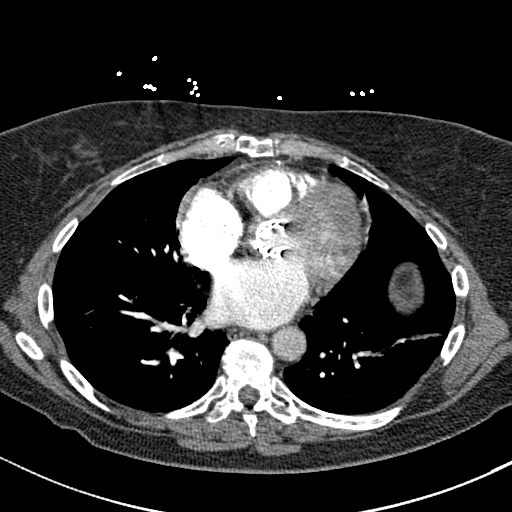
[im 94/214  lung]
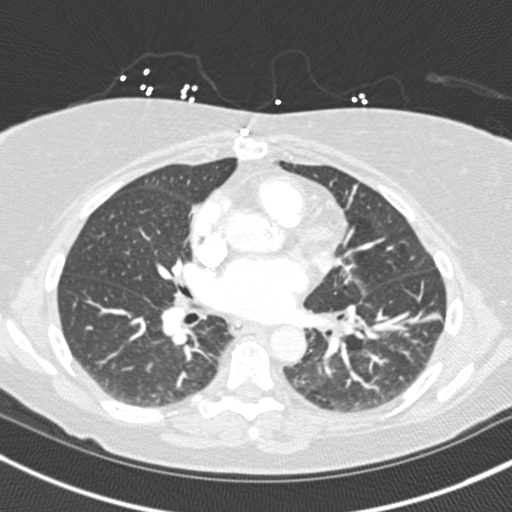
[im 107/214  soft-tissue]
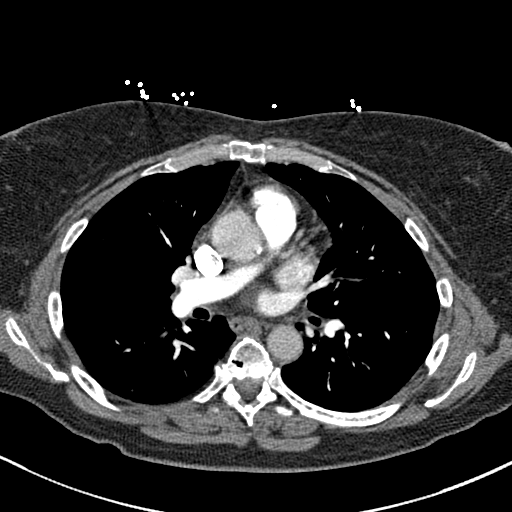
[im 120/214  lung]
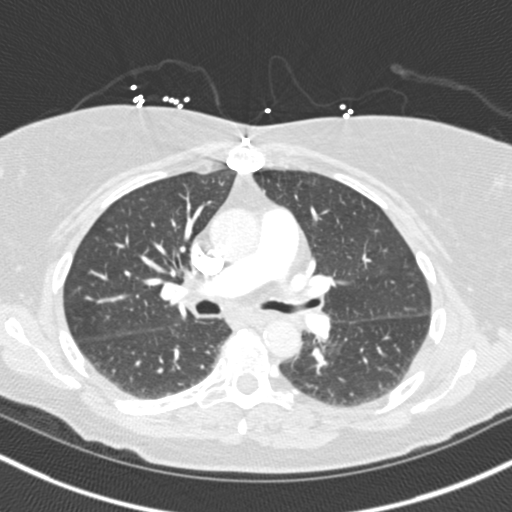
[im 134/214  soft-tissue]
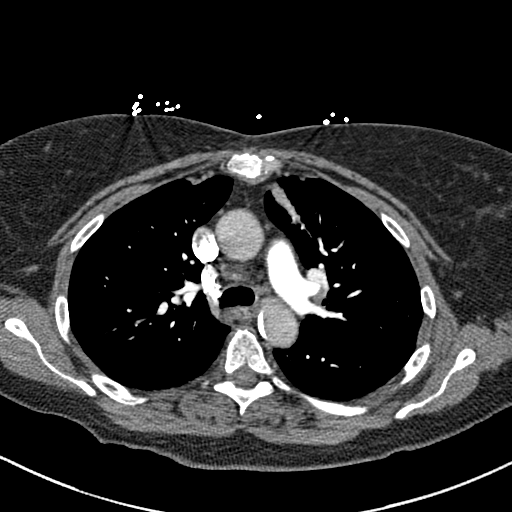
[im 147/214  lung]
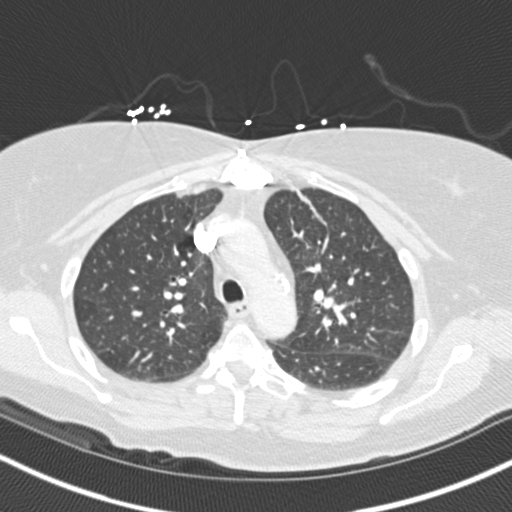
[im 160/214  soft-tissue]
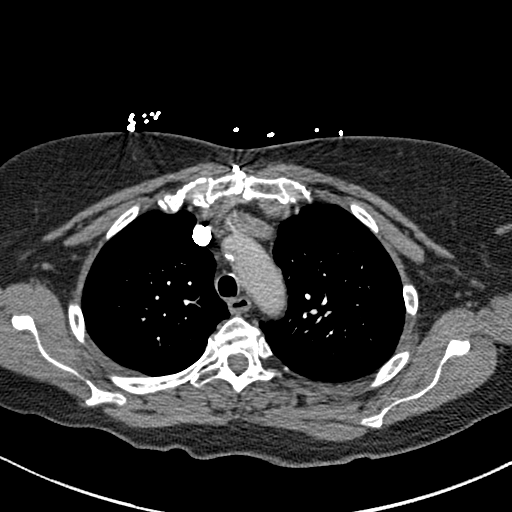
[im 174/214  lung]
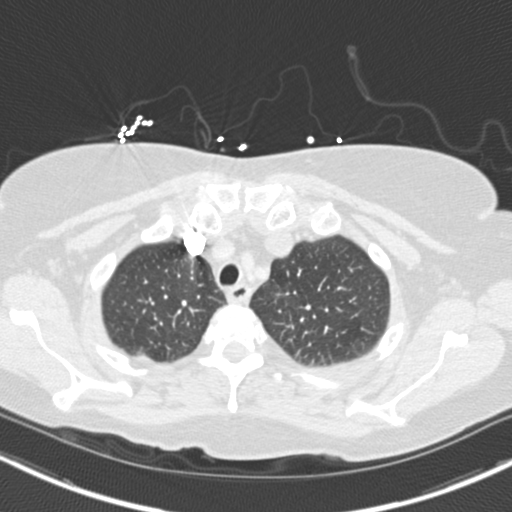
[im 187/214  soft-tissue]
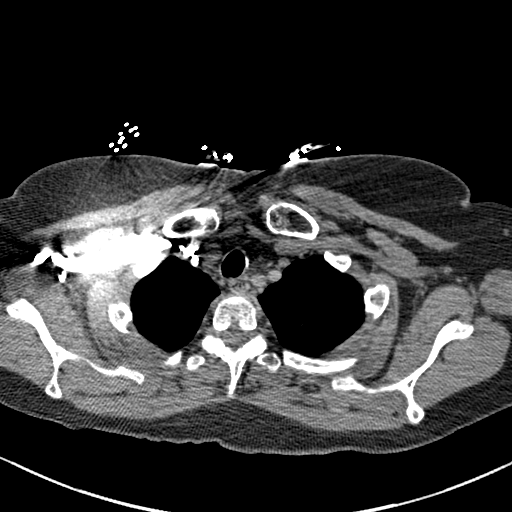
[im 200/214  lung]
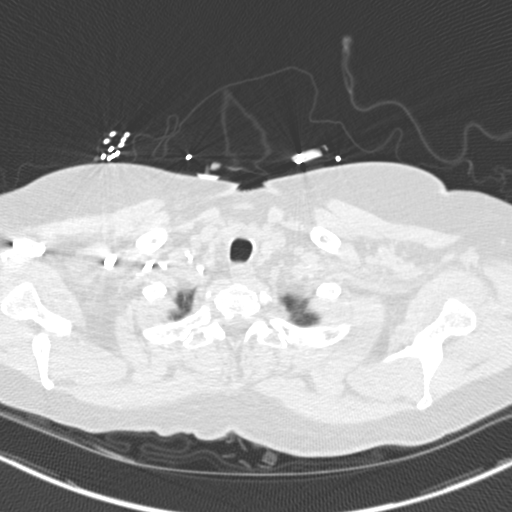

[Series 13: coronal · coronal · 0.71mm/px · 2 of 122 slices shown]
[im 41/122  soft-tissue]
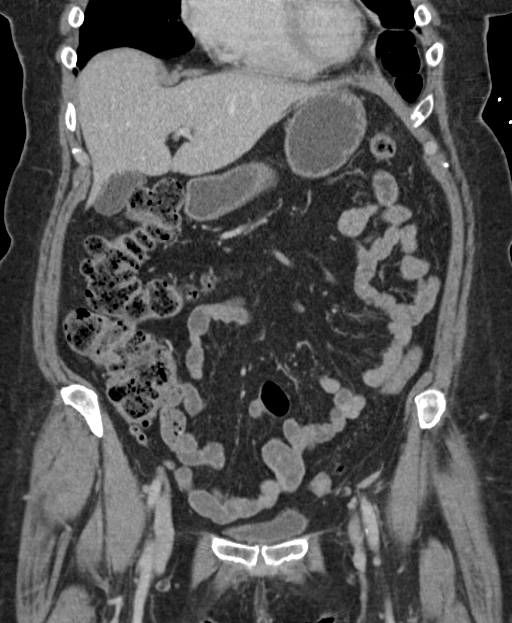
[im 81/122  soft-tissue]
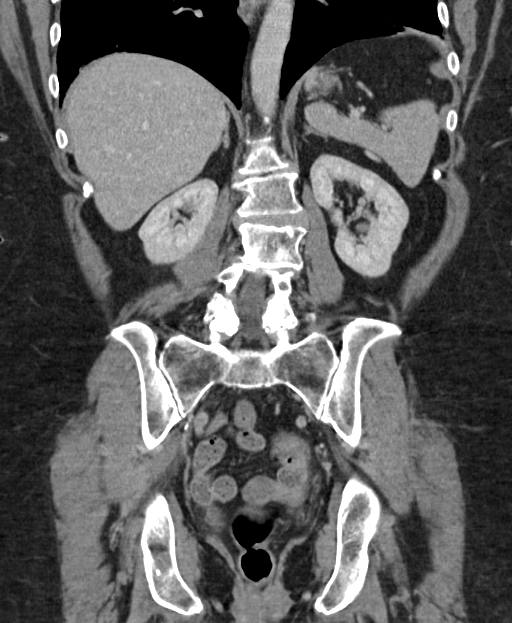

[17 of 46 positions shown; findings below may reference images not displayed]

FINDINGS: CTA CHEST FINDINGS

Cardiovascular: The study is of quality for the evaluation of
pulmonary embolism. There are no filling defects in the central,
lobar, segmental or subsegmental pulmonary artery branches to
suggest acute pulmonary embolism. Great vessels are normal in course
and caliber. Normal heart size. No significant pericardial
fluid/thickening. Aortic atherosclerosis without dissection or
aneurysm. Atherosclerosis at the origin of right brachiocephalic and
left subclavian arteries. Evidence of prior aortic valvular
replacement.

Mediastinum/Nodes: Unremarkable esophagus. No pathologically
enlarged axillary, mediastinal or hilar lymph nodes.

Lungs/Pleura: No pneumothorax. No pleural effusion. Scarring in the
left upper lobe and left lower lobe. No dominant mass or pneumonic
consolidation.

Upper abdomen: Unremarkable.

Musculoskeletal: No aggressive appearing focal osseous lesions.
Median sternotomy sutures. Multilevel degenerative disc disease
along the dorsal spine. No acute nor suspicious osseous appearing
abnormalities.

Review of the MIP images confirms the above findings.

CT ABDOMEN and PELVIS FINDINGS

Hepatobiliary: No focal liver abnormality is seen. No gallstones,
gallbladder wall thickening, or biliary dilatation.

Pancreas: Mild atrophy of the pancreatic gland without ductal
dilatation or mass.

Spleen: Normal

Adrenals/Urinary Tract: Normal bilateral adrenal glands. Symmetric
enhancement of both kidneys. Small extrarenal pelves are seen
bilaterally. No obstructive uropathy. No nephrolithiasis. Urinary
bladder is physiologically distended.

Stomach/Bowel: Nondistended stomach. There is normal small bowel
rotation without bowel dilatation, fold thickening nor acute
inflammation. Moderate colonic stool burden is noted within large
bowel to the level of the rectum. Normal-appearing appendix.

Vascular/Lymphatic: Aortic atherosclerosis. No enlarged abdominal or
pelvic lymph nodes.

Reproductive: Uterus and bilateral adnexa are unremarkable.

Other: Small fat containing umbilical hernia. No abdominopelvic
ascites.

Musculoskeletal: Levoscoliosis of the lumbar spine lumbar
spondylosis. Degenerative disc disease noted from L1 through L4 with
minimal grade 1 retrolisthesis of L4 on L5. No acute nor suspicious
osseous abnormalities.
IMPRESSION: 1. Status post aortic valvular repair. Aortic atherosclerosis is
noted without aneurysm or dissection. No acute pulmonary embolus.
Left upper lobe and left lower lobe atelectasis and/or scarring.
2. No acute intra-abdominal or pelvic abnormality. Moderate colonic
stool burden. Lumbar spondylosis. Fatty atrophy of the pancreas.

## 2017-05-01 NOTE — Telephone Encounter (Signed)
Patient given detailed instructions per Myocardial Perfusion Study Information Sheet for the test on 05/06/17. Patient notified to arrive 15 minutes early and that it is imperative to arrive on time for appointment to keep from having the test rescheduled.  If you need to cancel or reschedule your appointment, please call the office within 24 hours of your appointment. Failure to do so may result in a cancellation of your appointment, and a $50 no show fee. Patient verbalized understanding. Kirstie Peri

## 2017-05-02 ENCOUNTER — Ambulatory Visit (HOSPITAL_COMMUNITY)
Admission: RE | Admit: 2017-05-02 | Discharge: 2017-05-02 | Disposition: A | Discharge status: Home / Self Care | Payer: Medicare Other | Source: Ambulatory Visit | Attending: Cardiology | Admitting: Cardiology

## 2017-05-02 DIAGNOSIS — I700 Atherosclerosis of aorta: Secondary | ICD-10-CM | POA: Diagnosis not present

## 2017-05-02 DIAGNOSIS — Z952 Presence of prosthetic heart valve: Secondary | ICD-10-CM | POA: Insufficient documentation

## 2017-05-02 DIAGNOSIS — I272 Pulmonary hypertension, unspecified: Secondary | ICD-10-CM | POA: Diagnosis not present

## 2017-05-02 DIAGNOSIS — R0602 Shortness of breath: Secondary | ICD-10-CM | POA: Diagnosis not present

## 2017-05-02 DIAGNOSIS — I270 Primary pulmonary hypertension: Secondary | ICD-10-CM | POA: Diagnosis not present

## 2017-05-02 MED ORDER — TECHNETIUM TC 99M DIETHYLENETRIAME-PENTAACETIC ACID
30.0000 | Freq: Once | INTRAVENOUS | Status: AC
  Administered 2017-05-02: 30 via RESPIRATORY_TRACT

## 2017-05-02 MED ORDER — TECHNETIUM TO 99M ALBUMIN AGGREGATED
4.1000 | Freq: Once | INTRAVENOUS | Status: AC | PRN
  Administered 2017-05-02: 4.1 via INTRAVENOUS

## 2017-05-03 ENCOUNTER — Encounter: Payer: Self-pay | Admitting: Internal Medicine

## 2017-05-03 ENCOUNTER — Ambulatory Visit (INDEPENDENT_AMBULATORY_CARE_PROVIDER_SITE_OTHER): Payer: Medicare Other | Admitting: Internal Medicine

## 2017-05-03 VITALS — BP 138/80 | HR 70 | Ht 64.0 in | Wt 189.4 lb

## 2017-05-03 DIAGNOSIS — R0602 Shortness of breath: Secondary | ICD-10-CM

## 2017-05-03 LAB — NITRIC OXIDE: Nitric Oxide: 22

## 2017-05-03 NOTE — Patient Instructions (Addendum)
ICD-9-CM ICD-10-CM   1. Shortness of breath 786.05 R06.02 Nitric oxide   Unclear reason for shortness of breath Doubt pulmonary fibrosis - if so then you have early disease and symptoms are out of proprtion Asthma mostly ruled out Most likely: physical fitness and diastolic dysfunction   Plan CPST on bike at [facility] with EIB challenge   Followup  return to see me or APP next few weeks but after completing above

## 2017-05-03 NOTE — Progress Notes (Signed)
Subjective:    Patient ID: Maureen Keller, female    DOB: 1946/07/07, 71 y.o.   MRN: [id number]  PCP Seward Estrellita, MD  HPI   IOV 05/03/2017  Chief Complaint  Patient presents with  . Pulmonary Consult    off and on problems with SOB, had aortic valve replaced   71 year old female. Referred for dyspnea  Used to walk 3 miles a day. Then, Leading up to dec 2016 was having worsening class 3 DOE. Resulted in aortic valve replacemen dec 2016 by Dr Roxan Hockey. Then over next few to several months this dyspnea slowly resolved. By summer 2017 was free of dyspnea though did have some cough. Tried daily exercises at this time but had to have bilateral TKR fall 2017 ad early 2018. In summer 2017 did see ENT for cough and Rx with PPI and gabapentin and cough resolved at that time. Now past few months, having a DIFFERENT DYSPNEA. Feels this is not deeconditioning but real dyspnea. Says can happen randomly. Thought with exertion and walking around thouse. Feel she needs to take a deep breath. No wheezing but cough is back in the throat area. No orthopnea. No PND, No eedema. Still not back to daily exercises   Echocardiogram 04/01/2017: According to Dr. Fransico Him: Echo showed mild LVH with normal LVF, stiffness of heart muscle, stable bioprosthetic AVR with normal function, mild to moderate MR and mild LAE, mild pulmonary HTN   CT angiogram chest 03/21/2017: Personally visualized evidence of pulmonary embolism. This possible bibasal atelectasis    VQ scan 05/02/2017:  to me the chest x-ray looks personally clear to VQ scan is reported as no evidence of acute or chronic pulmonary embolism   FeNO - 22 pbb and normal 05/03/2017    Results for Maureen Keller, Maureen Keller (MRN [id number]) as of 05/03/2017 10:28  Ref. Range 03/20/2017 21:56 03/20/2017 22:45 03/20/2017 22:48 03/21/2017 00:52 04/30/2017 09:42  Creatinine Latest Ref Range: 0.57 - 1.00 mg/dL 0.70    0.85   Results for Maureen Keller, Maureen Keller (MRN [id number]) as  of 05/03/2017 10:28  Ref. Range 03/20/2017 21:56 03/20/2017 22:45 03/20/2017 22:48 03/21/2017 00:52 04/30/2017 09:42  Hemoglobin Latest Ref Range: 11.1 - 15.9 g/dL 14.5    15.4    has a past medical history of Aortic stenosis (11/2015); Arthritis; Chronic kidney disease (CKD), stage III (moderate); Complication of anesthesia; Constipation; Depression; Dyspnea; GERD (gastroesophageal reflux disease); Glaucoma; Hyperlipidemia; Hypertension; Hypothyroidism; Mitral valve disorder; and Pulmonary hypertension ([place]).   reports that she quit smoking about 40 years ago. She quit after 12.00 years of use. She has never used smokeless tobacco.  Past Surgical History:  Procedure Laterality Date  . AORTIC VALVE REPLACEMENT N/A 12/16/2015   Procedure: AORTIC VALVE REPLACEMENT (AVR);  Surgeon: Melrose Nakayama, MD;  Location: [facility];  Service: Open Heart Surgery;  Laterality: N/A;  Edwards Intuity Elite Aortic Valve 9mm  . BUNIONECTOMY Right   . CARDIAC CATHETERIZATION  11/04/2009   normal coronaries  . CARDIAC CATHETERIZATION N/A 11/30/2015   Procedure: Right/Left Heart Cath and Coronary Angiography;  Surgeon: Burnell Blanks, MD;  Location: [facility];  Service: Cardiovascular;  Laterality: N/A;  . CARPAL TUNNEL RELEASE Right 1982  . CARPAL TUNNEL RELEASE Left   . CATARACT EXTRACTION Bilateral   . COLONOSCOPY    . TEE WITHOUT CARDIOVERSION N/A 12/16/2015   Procedure: TRANSESOPHAGEAL ECHOCARDIOGRAM (TEE);  Surgeon: Melrose Nakayama, MD;  Location: [facility];  Service: Open Heart Surgery;  Laterality:  N/A;  . TOE SURGERY Right    2 nd toe  . TOTAL KNEE ARTHROPLASTY Right 10/09/2016   Procedure: TOTAL RIGHT KNEE ARTHROPLASTY;  Surgeon: Melrose Nakayama, MD;  Location: [facility];  Service: Orthopedics;  Laterality: Right;  . TOTAL KNEE ARTHROPLASTY Left 01/29/2017   Procedure: LEFT TOTAL KNEE ARTHROPLASTY;  Surgeon: Melrose Nakayama, MD;  Location: [facility];  Service: Orthopedics;  Laterality: Left;  .  TRIGGER FINGER RELEASE Bilateral    thumbs - 2 separtate surgeries    No Known Allergies  Immunization History  Administered Date(s) Administered  . Influenza,inj,Quad PF,36+ Mos 12/22/2015, 10/11/2016    Family History  Problem Relation Age of Onset  . Diabetes Mother   . Suicidality Mother 65  . Hypertension Father   . Heart disease Father   . Diabetes Father      Current Outpatient Prescriptions:  .  aspirin EC 81 MG tablet, Take 81 mg by mouth daily., Disp: , Rfl:  .  Cholecalciferol (VITAMIN D-3) 5000 units TABS, Take 5,000 Units by mouth daily., Disp: , Rfl:  .  docusate sodium (COLACE) 100 MG capsule, Take 300 mg by mouth daily. , Disp: , Rfl:  .  furosemide (LASIX) 20 MG tablet, Take 1 tablet (20 mg total) by mouth daily as needed. Take daily as needed for shortness of breath (Patient taking differently: Take 20 mg by mouth daily as needed (shortness of breath/fluid retention.). ), Disp: 30 tablet, Rfl: 11 .  lamoTRIgine (LAMICTAL) 100 MG tablet, Take 100-200 mg by mouth 2 (two) times daily. 200mg  in the morning and 100mg  in the evening, Disp: , Rfl:  .  latanoprost (XALATAN) 0.005 % ophthalmic solution, Place 1 drop into both eyes at bedtime., Disp: , Rfl:  .  levothyroxine (SYNTHROID, LEVOTHROID) 137 MCG tablet, Take 137 mcg by mouth daily before breakfast., Disp: , Rfl:  .  losartan (COZAAR) 100 MG tablet, Take 1 tablet (100 mg total) by mouth daily., Disp: 30 tablet, Rfl: 5 .  metoprolol succinate (TOPROL-XL) 25 MG 24 hr tablet, Take 1 tablet (25 mg total) by mouth daily., Disp: 90 tablet, Rfl: 3 .  omeprazole (PRILOSEC) 20 MG capsule, Take 20 mg by mouth at bedtime. , Disp: , Rfl:  .  traZODone (DESYREL) 100 MG tablet, Take 300 mg by mouth at bedtime. , Disp: , Rfl:    Review of Systems  Constitutional: Negative for fever and unexpected weight change.  HENT: Negative for congestion, dental problem, ear pain, nosebleeds, postnasal drip, rhinorrhea, sinus pressure,  sneezing, sore throat and trouble swallowing.   Eyes: Negative for redness and itching.  Respiratory: Positive for cough and shortness of breath. Negative for chest tightness and wheezing.   Cardiovascular: Negative for palpitations and leg swelling.  Gastrointestinal: Negative for nausea and vomiting.  Genitourinary: Negative for dysuria.  Musculoskeletal: Negative for joint swelling.  Skin: Negative for rash.  Neurological: Negative for headaches.  Hematological: Does not bruise/bleed easily.  Psychiatric/Behavioral: Negative for dysphoric mood. The patient is not nervous/anxious.        Objective:   Physical Exam  Constitutional: She is oriented to person, place, and time. She appears well-developed and well-nourished. No distress.  HENT:  Head: Normocephalic and atraumatic.  Right Ear: External ear normal.  Left Ear: External ear normal.  Mouth/Throat: Oropharynx is clear and moist. No oropharyngeal exudate.  Eyes: Conjunctivae and EOM are normal. Pupils are equal, round, and reactive to light. Right eye exhibits no discharge. Left eye exhibits no discharge. No  scleral icterus.  Neck: Normal range of motion. Neck supple. No JVD present. No tracheal deviation present. No thyromegaly present.  Cardiovascular: Normal rate, regular rhythm and intact distal pulses.  Exam reveals no gallop and no friction rub.   Murmur heard. Pulmonary/Chest: Effort normal and breath sounds normal. No respiratory distress. She has no wheezes. She has no rales. She exhibits no tenderness.  Abdominal: Soft. Bowel sounds are normal. She exhibits no distension and no mass. There is no tenderness. There is no rebound and no guarding.  Musculoskeletal: Normal range of motion. She exhibits no edema or tenderness.  Lymphadenopathy:    She has no cervical adenopathy.  Neurological: She is alert and oriented to person, place, and time. She has normal reflexes. No cranial nerve deficit. She exhibits normal muscle  tone. Coordination normal.  Skin: Skin is warm and dry. No rash noted. She is not diaphoretic. No erythema. No pallor.  Psychiatric: She has a normal mood and affect. Her behavior is normal. Judgment and thought content normal.  Vitals reviewed.   Vitals:   05/03/17 1016  BP: 138/80  Pulse: 70  SpO2: 98%  Weight: 189 lb 6.4 oz (85.9 kg)  Height: 5\' 4"  (1.626 m)    Estimated body mass index is 32.51 kg/m as calculated from the following:   Height as of this encounter: 5\' 4"  (1.626 m).   Weight as of this encounter: 189 lb 6.4 oz (85.9 kg).       Assessment & Plan:     ICD-9-CM ICD-10-CM   1. Shortness of breath 786.05 R06.02 Nitric oxide     Unclear reason for shortness of breath Doubt pulmonary fibrosis - if so then you have early disease and symptoms are out of proprtion Asthma mostly ruled out Most likely: physical fitness and diastolic dysfunction   Plan CPST on bike at [facility] with EIB challenge   Followup  return to see me or APP next few weeks but after completing above    Dr. Brand Males, M.D., [facility].C.P Pulmonary and Critical Care Medicine Staff Physician [place] [facility] Pager: [phone], If no answer or between  15:00h - 7:00h: call [phone]  05/03/2017 11:04 AM

## 2017-05-06 ENCOUNTER — Ambulatory Visit (HOSPITAL_COMMUNITY): Payer: Medicare Other | Attending: Cardiology

## 2017-05-06 DIAGNOSIS — R0602 Shortness of breath: Secondary | ICD-10-CM | POA: Insufficient documentation

## 2017-05-06 LAB — MYOCARDIAL PERFUSION IMAGING
CHL CUP NUCLEAR SDS: 6
CHL CUP NUCLEAR SRS: 5
CHL CUP RESTING HR STRESS: 61 {beats}/min
CSEPEDS: 30 s
CSEPHR: 86 %
Estimated workload: 6.4 METS
Exercise duration (min): 4 min
LHR: 0.3
LV sys vol: 10 mL
LVDIAVOL: 66 mL (ref 46–106)
MPHR: 149 {beats}/min
Peak BP: 167 mmHg
Peak HR: 129 {beats}/min
SSS: 11
TID: 1.04

## 2017-05-06 MED ORDER — TECHNETIUM TC 99M TETROFOSMIN IV KIT
10.4000 | PACK | Freq: Once | INTRAVENOUS | Status: AC | PRN
  Administered 2017-05-06: 10.4 via INTRAVENOUS
  Filled 2017-05-06: qty 11

## 2017-05-06 MED ORDER — TECHNETIUM TC 99M TETROFOSMIN IV KIT
30.4000 | PACK | Freq: Once | INTRAVENOUS | Status: AC | PRN
  Administered 2017-05-06: 30.4 via INTRAVENOUS
  Filled 2017-05-06: qty 31

## 2017-05-07 DIAGNOSIS — M25562 Pain in left knee: Secondary | ICD-10-CM | POA: Diagnosis not present

## 2017-05-07 DIAGNOSIS — M25662 Stiffness of left knee, not elsewhere classified: Secondary | ICD-10-CM | POA: Diagnosis not present

## 2017-05-07 DIAGNOSIS — Z96652 Presence of left artificial knee joint: Secondary | ICD-10-CM | POA: Diagnosis not present

## 2017-05-18 ENCOUNTER — Encounter (HOSPITAL_COMMUNITY): Payer: Self-pay | Admitting: Orthopaedic Surgery

## 2017-05-18 NOTE — Addendum Note (Signed)
Addendum  created 05/18/17 0937 by Duane Boston, MD   Sign clinical note

## 2017-05-20 DIAGNOSIS — K640 First degree hemorrhoids: Secondary | ICD-10-CM | POA: Diagnosis not present

## 2017-05-20 DIAGNOSIS — R194 Change in bowel habit: Secondary | ICD-10-CM | POA: Diagnosis not present

## 2017-05-20 DIAGNOSIS — K5730 Diverticulosis of large intestine without perforation or abscess without bleeding: Secondary | ICD-10-CM | POA: Diagnosis not present

## 2017-05-21 DIAGNOSIS — E039 Hypothyroidism, unspecified: Secondary | ICD-10-CM | POA: Diagnosis not present

## 2017-05-22 ENCOUNTER — Ambulatory Visit (HOSPITAL_COMMUNITY): Payer: Medicare Other | Attending: Internal Medicine

## 2017-05-22 ENCOUNTER — Other Ambulatory Visit (HOSPITAL_COMMUNITY): Payer: Self-pay | Admitting: *Deleted

## 2017-05-22 DIAGNOSIS — R0602 Shortness of breath: Secondary | ICD-10-CM | POA: Diagnosis not present

## 2017-05-22 DIAGNOSIS — R0600 Dyspnea, unspecified: Secondary | ICD-10-CM

## 2017-05-28 ENCOUNTER — Encounter (HOSPITAL_BASED_OUTPATIENT_CLINIC_OR_DEPARTMENT_OTHER): Payer: Medicare Other

## 2017-05-30 DIAGNOSIS — R0600 Dyspnea, unspecified: Secondary | ICD-10-CM | POA: Diagnosis not present

## 2017-06-13 ENCOUNTER — Telehealth: Payer: Self-pay | Admitting: Internal Medicine

## 2017-06-13 IMAGING — CR DG CHEST 2V
2 series · 2 of 2 positions shown · non-contrast
Comparison: Chest radiograph and chest CT March 20, 2017

CLINICAL DATA: Hypertension and shortness of breath

EXAM:
CHEST  2 VIEW

[chest pa]
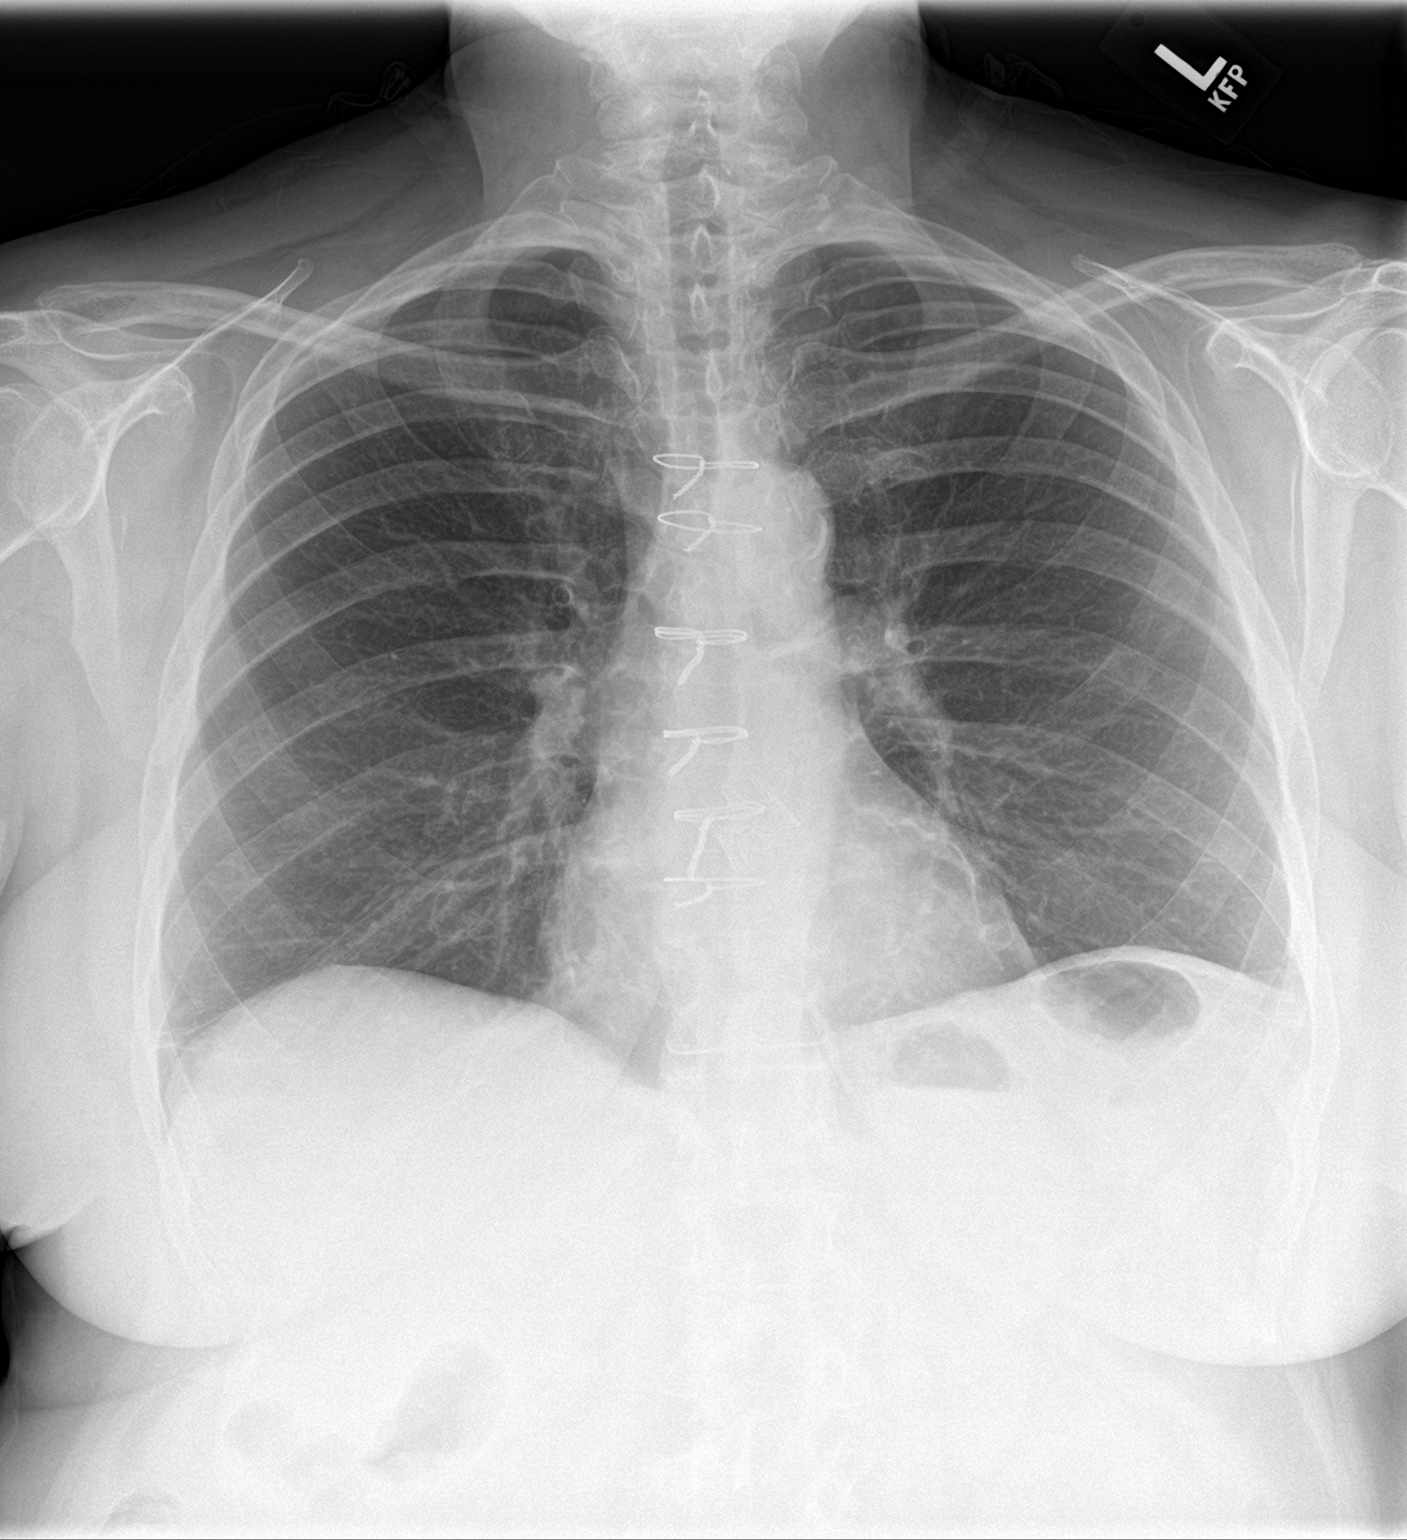

[chest lat]
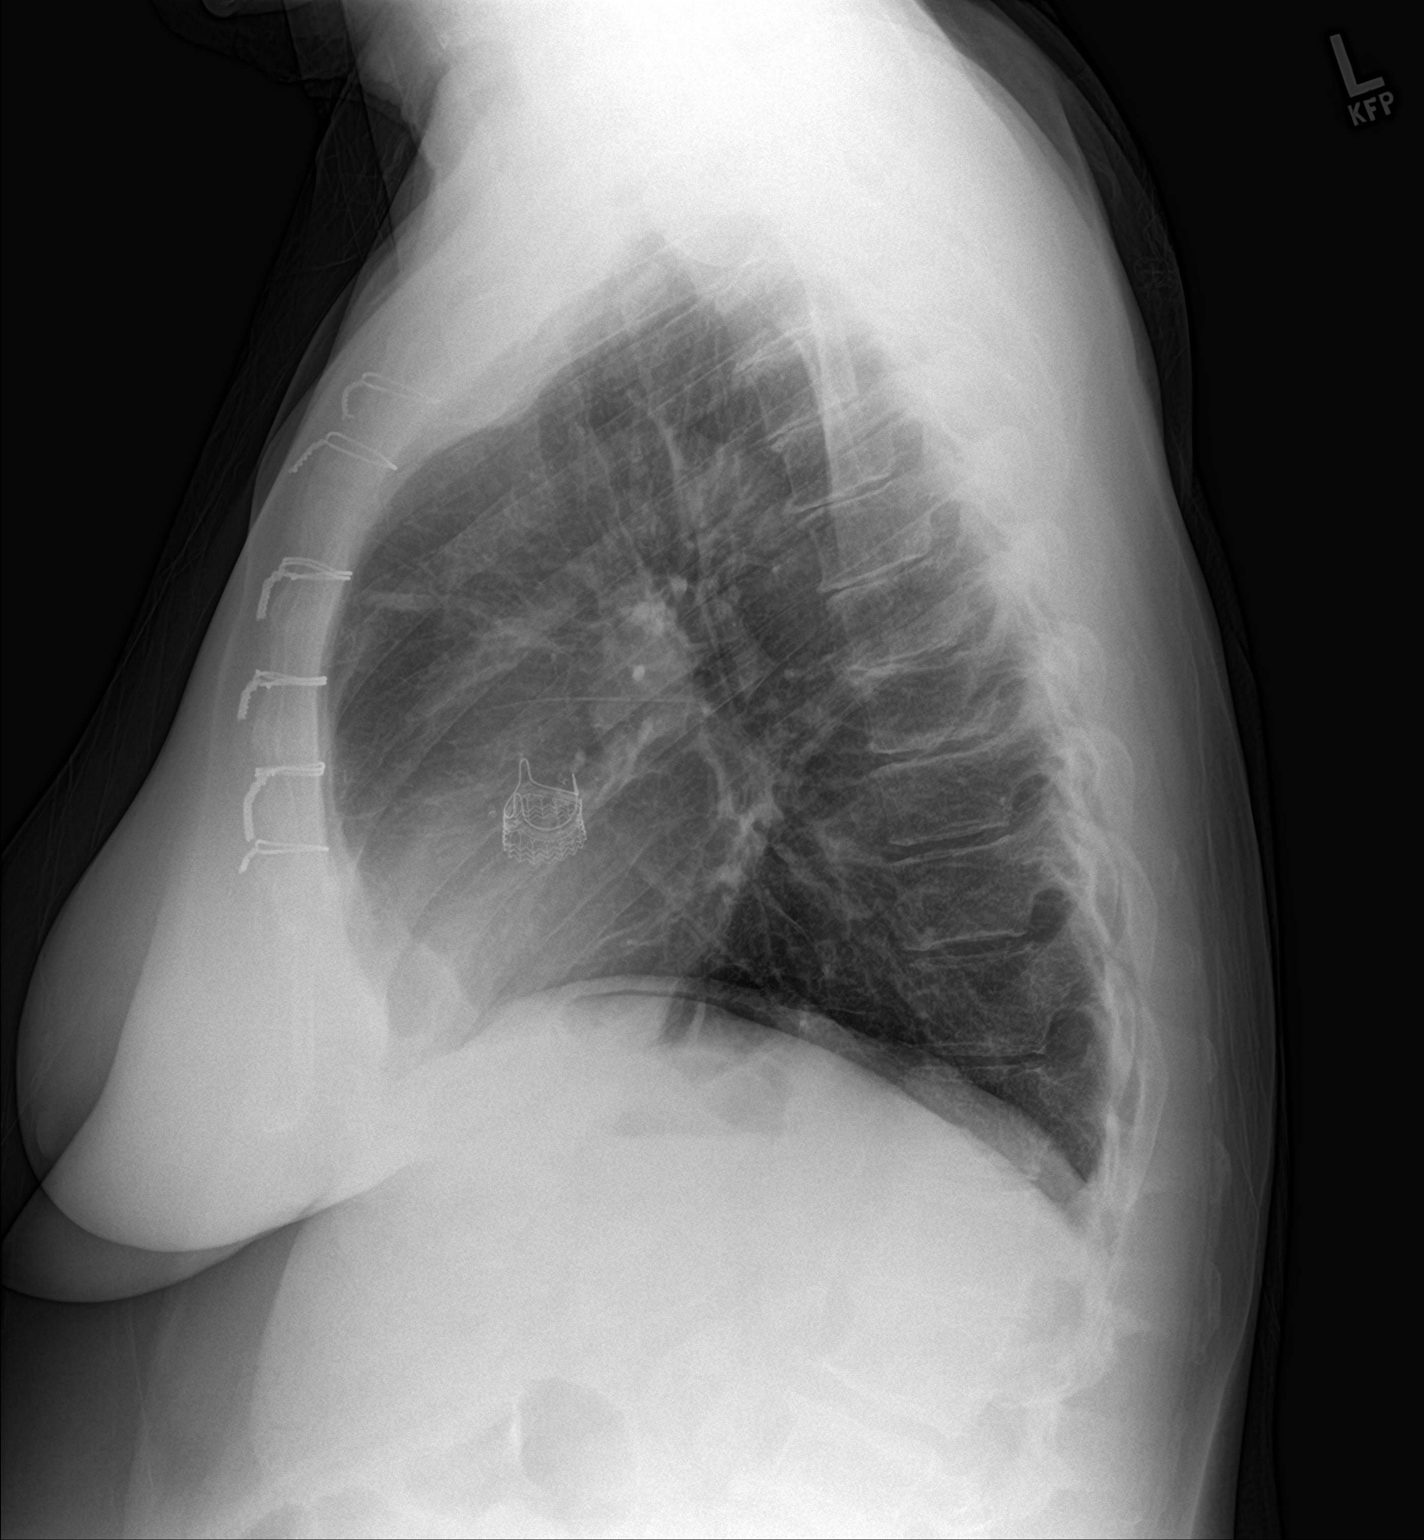

[2 of 2 positions shown; findings below may reference images not displayed]

FINDINGS: There is minimal scarring in the bases laterally. The lungs
elsewhere are clear. Heart size and pulmonary vascularity are
normal. No adenopathy. There is aortic atherosclerosis. Patient is
status post aortic valve replacement. There is degenerative change
in the thoracic spine.
IMPRESSION: Slight bibasilar scarring. No edema or consolidation. There is
aortic atherosclerosis. Patient is status post aortic valve
replacement.

## 2017-06-13 NOTE — Telephone Encounter (Signed)
Dyspnea on cpst most likely related to weight and deconditioning. Will discuss 07/12/17 vist   Dr. Brand Males, M.D., F.C.C.P Pulmonary and Critical Care Medicine Staff Physician [place] [facility] Pager: [phone], If no answer or between  15:00h - 7:00h: call [phone]  06/13/2017 11:42 AM

## 2017-06-13 NOTE — Telephone Encounter (Signed)
Spoke with patient. She is aware of results. No further questions.

## 2017-07-04 DIAGNOSIS — H401231 Low-tension glaucoma, bilateral, mild stage: Secondary | ICD-10-CM | POA: Diagnosis not present

## 2017-07-12 ENCOUNTER — Ambulatory Visit: Payer: Medicare Other | Admitting: Internal Medicine

## 2017-08-07 ENCOUNTER — Other Ambulatory Visit: Payer: Self-pay | Admitting: Cardiology

## 2017-08-13 ENCOUNTER — Other Ambulatory Visit: Payer: Self-pay | Admitting: Family Medicine

## 2017-08-13 DIAGNOSIS — N63 Unspecified lump in unspecified breast: Secondary | ICD-10-CM

## 2017-08-22 DIAGNOSIS — Z9889 Other specified postprocedural states: Secondary | ICD-10-CM | POA: Diagnosis not present

## 2017-08-22 DIAGNOSIS — M65321 Trigger finger, right index finger: Secondary | ICD-10-CM | POA: Diagnosis not present

## 2017-09-06 DIAGNOSIS — M65321 Trigger finger, right index finger: Secondary | ICD-10-CM | POA: Diagnosis not present

## 2017-09-09 DIAGNOSIS — E038 Other specified hypothyroidism: Secondary | ICD-10-CM | POA: Diagnosis not present

## 2017-09-09 DIAGNOSIS — F349 Persistent mood [affective] disorder, unspecified: Secondary | ICD-10-CM | POA: Diagnosis not present

## 2017-09-09 DIAGNOSIS — E785 Hyperlipidemia, unspecified: Secondary | ICD-10-CM | POA: Diagnosis not present

## 2017-09-09 DIAGNOSIS — E039 Hypothyroidism, unspecified: Secondary | ICD-10-CM | POA: Diagnosis not present

## 2017-09-09 DIAGNOSIS — R7301 Impaired fasting glucose: Secondary | ICD-10-CM | POA: Diagnosis not present

## 2017-09-09 DIAGNOSIS — Z952 Presence of prosthetic heart valve: Secondary | ICD-10-CM | POA: Diagnosis not present

## 2017-09-09 DIAGNOSIS — I10 Essential (primary) hypertension: Secondary | ICD-10-CM | POA: Diagnosis not present

## 2017-09-09 DIAGNOSIS — H401130 Primary open-angle glaucoma, bilateral, stage unspecified: Secondary | ICD-10-CM | POA: Diagnosis not present

## 2017-09-16 ENCOUNTER — Ambulatory Visit
Admission: RE | Admit: 2017-09-16 | Discharge: 2017-09-16 | Disposition: A | Discharge status: Home / Self Care | Payer: Medicare Other | Source: Ambulatory Visit | Attending: Family Medicine | Admitting: Family Medicine

## 2017-09-16 DIAGNOSIS — N63 Unspecified lump in unspecified breast: Secondary | ICD-10-CM

## 2017-09-16 DIAGNOSIS — R928 Other abnormal and inconclusive findings on diagnostic imaging of breast: Secondary | ICD-10-CM | POA: Diagnosis not present

## 2017-09-16 DIAGNOSIS — N6001 Solitary cyst of right breast: Secondary | ICD-10-CM | POA: Diagnosis not present

## 2017-09-27 DIAGNOSIS — R300 Dysuria: Secondary | ICD-10-CM | POA: Diagnosis not present

## 2017-10-08 ENCOUNTER — Other Ambulatory Visit: Payer: Self-pay | Admitting: Physician Assistant

## 2017-10-18 ENCOUNTER — Encounter: Payer: Self-pay | Admitting: *Deleted

## 2017-10-25 DIAGNOSIS — R0602 Shortness of breath: Secondary | ICD-10-CM | POA: Diagnosis not present

## 2017-10-25 DIAGNOSIS — K219 Gastro-esophageal reflux disease without esophagitis: Secondary | ICD-10-CM | POA: Diagnosis not present

## 2017-10-25 DIAGNOSIS — I10 Essential (primary) hypertension: Secondary | ICD-10-CM | POA: Diagnosis not present

## 2017-10-25 DIAGNOSIS — E039 Hypothyroidism, unspecified: Secondary | ICD-10-CM | POA: Diagnosis not present

## 2017-10-25 DIAGNOSIS — H409 Unspecified glaucoma: Secondary | ICD-10-CM | POA: Diagnosis not present

## 2017-10-25 DIAGNOSIS — E785 Hyperlipidemia, unspecified: Secondary | ICD-10-CM | POA: Diagnosis not present

## 2017-10-25 DIAGNOSIS — Z952 Presence of prosthetic heart valve: Secondary | ICD-10-CM | POA: Diagnosis not present

## 2017-10-25 DIAGNOSIS — F4310 Post-traumatic stress disorder, unspecified: Secondary | ICD-10-CM | POA: Diagnosis not present

## 2017-10-25 DIAGNOSIS — I270 Primary pulmonary hypertension: Secondary | ICD-10-CM | POA: Diagnosis not present

## 2017-10-30 DIAGNOSIS — H401231 Low-tension glaucoma, bilateral, mild stage: Secondary | ICD-10-CM | POA: Diagnosis not present

## 2017-11-01 ENCOUNTER — Telehealth: Payer: Self-pay

## 2017-11-01 ENCOUNTER — Ambulatory Visit (INDEPENDENT_AMBULATORY_CARE_PROVIDER_SITE_OTHER): Payer: Medicare Other | Admitting: Cardiology

## 2017-11-01 ENCOUNTER — Encounter: Payer: Self-pay | Admitting: Cardiology

## 2017-11-01 VITALS — BP 110/62 | HR 57 | Ht 63.5 in | Wt 189.5 lb

## 2017-11-01 DIAGNOSIS — I350 Nonrheumatic aortic (valve) stenosis: Secondary | ICD-10-CM

## 2017-11-01 DIAGNOSIS — R0609 Other forms of dyspnea: Secondary | ICD-10-CM | POA: Diagnosis not present

## 2017-11-01 DIAGNOSIS — I272 Pulmonary hypertension, unspecified: Secondary | ICD-10-CM

## 2017-11-01 DIAGNOSIS — I10 Essential (primary) hypertension: Secondary | ICD-10-CM | POA: Diagnosis not present

## 2017-11-01 DIAGNOSIS — I059 Rheumatic mitral valve disease, unspecified: Secondary | ICD-10-CM

## 2017-11-01 NOTE — Patient Instructions (Addendum)
Medication Instructions:  Your physician recommends that you continue on your current medications as directed. Please refer to the Current Medication list given to you today.  Labwork: Your physician recommends that you have lab work today. BMET, CBC, and INR.   Testing/Procedures: Your physician has requested that you have a cardiac catheterization. Cardiac catheterization is used to diagnose and/or treat various heart conditions. Doctors may recommend this procedure for a number of different reasons. The most common reason is to evaluate chest pain. Chest pain can be a symptom of coronary artery disease (CAD), and cardiac catheterization can show whether plaque is narrowing or blocking your heart's arteries. This procedure is also used to evaluate the valves, as well as measure the blood flow and oxygen levels in different parts of your heart. For further information please visit [email or web address]. Please follow instruction sheet, as given.  Follow-Up: Your physician wants you to follow-up in: 12 months with Dr. Radford Pax. You will receive a reminder letter in the mail two months in advance. If you don't receive a letter, please call our office to schedule the follow-up appointment.  We will call you with when cardiac catheterization is schedule and we will give you instructions at that time.  If you need a refill on your cardiac medications before your next appointment, please call your pharmacy.

## 2017-11-01 NOTE — Telephone Encounter (Signed)
Patient was seen in office today with Dr. Radford Pax. Patient needs a right heart cath for SOB and pulmonary HTN with Dr. Aundra Dubin. Called [facility] [place] and left voicemail for her to call back. Will also send message. Patient is having lab work done today. Patient is very flexible and is agreeable for any day in the near future. Will also send message to Dr. Theodosia Blender nurse.

## 2017-11-01 NOTE — Progress Notes (Signed)
Cardiology Office Note:    Date:  11/01/2017   ID:  Maureen Keller, DOB September 09, 1946, MRN [id number]  PCP:  Seward Thanvi, MD  Cardiologist:  Fransico Him, MD   Referring MD: Seward Hildegard, MD   Chief Complaint  Patient presents with  . Follow-up    AS/AR/MR    History of Present Illness:    Maureen Keller is a 71 y.o. 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  She is here today for followup and is doing well.  She denies any chest pain or pressure,  PND, orthopnea, LE edema, dizziness, palpitations or syncope. She continues to have problems with SOB when she walks during the day. She had PFTs 2 years ago which showed reduced DLCO and she was referred to Pulmonary.  She had a normal VQ scan. It was felt that her SOB was due to deconditioning and diastolic dysfunction.   She is compliant with her meds and is tolerating meds with no SE.      Past Medical History:  Diagnosis Date  . Aortic stenosis 11/2015   Severe AS s/p Bioprosthetic AVR  . Arthritis   . Chronic kidney disease (CKD), stage III (moderate) (HCC)   . Complication of anesthesia    awakened durimg procedure- heart cath 11/2015.  woke up during buninectomy  . Constipation   . Depression    PTSD  . Dyspnea   . GERD (gastroesophageal reflux disease)   . Glaucoma   . Hyperlipidemia   . Hypertension   . Hypothyroidism   . Mitral valve disorder    Mild to moderate MR by echo 03/2017 - no change from prior echo  . Pulmonary hypertension (HCC)    mild with PASP 31mmHg by echo 03/2017    Past Surgical History:  Procedure Laterality Date  . AORTIC VALVE REPLACEMENT (AVR) N/A 12/16/2015   Performed by Melrose Nakayama, MD at [facility] [facility]  . BUNIONECTOMY Right   . CARDIAC CATHETERIZATION  11/04/2009   normal coronaries  . CARPAL TUNNEL RELEASE Right 1982  . CARPAL TUNNEL RELEASE  Left   . CATARACT EXTRACTION Bilateral   . COLONOSCOPY    . LEFT TOTAL KNEE ARTHROPLASTY Left 01/29/2017   Performed by Melrose Nakayama, MD at [place]  . Right/Left Heart Cath and Coronary Angiography N/A 11/30/2015   Performed by Burnell Blanks, MD at [facility]  . TOE SURGERY Right    2 nd toe  . TOTAL RIGHT KNEE ARTHROPLASTY Right 10/09/2016   Performed by Melrose Nakayama, MD at [facility]  . TRANSESOPHAGEAL ECHOCARDIOGRAM (TEE) N/A 12/16/2015   Performed by Melrose Nakayama, MD at [facility]  . TRIGGER FINGER RELEASE Bilateral    thumbs - 2 separtate surgeries    Current Medications: Current Meds  Medication Sig  . aspirin EC 81 MG tablet Take 81 mg by mouth daily.  . Cholecalciferol (VITAMIN D-3) 5000 units TABS Take 5,000 Units by mouth daily.  Marland Kitchen docusate sodium (COLACE) 100 MG capsule Take 300 mg by mouth daily.   . furosemide (LASIX) 20 MG tablet Take 1 tablet (20 mg total) by mouth daily as needed. Take daily as needed for shortness of breath (Patient taking differently: Take 20 mg by mouth daily as needed (shortness of breath/fluid retention.). )  . lamoTRIgine (LAMICTAL) 100 MG tablet Take 100-200 mg by  mouth 2 (two) times daily. 200mg  in the morning and 100mg  in the evening  . latanoprost (XALATAN) 0.005 % ophthalmic solution Place 1 drop into both eyes at bedtime.  Marland Kitchen levothyroxine (SYNTHROID, LEVOTHROID) 137 MCG tablet Take 137 mcg by mouth daily before breakfast.  . losartan (COZAAR) 100 MG tablet TAKE 1 TABLET DAILY.  . metoprolol succinate (TOPROL-XL) 25 MG 24 hr tablet TAKE 1 TABLET ONCE DAILY.  Marland Kitchen omeprazole (PRILOSEC) 20 MG capsule Take 20 mg by mouth at bedtime.   . traZODone (DESYREL) 100 MG tablet Take 300 mg by mouth at bedtime.      Allergies:   Patient has no known allergies.   Social History   Socioeconomic History  . Marital status: Single    Spouse name: None  . Number of children: None  . Years of education: None  . Highest education  level: None  Social Needs  . Financial resource strain: None  . Food insecurity - worry: None  . Food insecurity - inability: None  . Transportation needs - medical: None  . Transportation needs - non-medical: None  Occupational History  . None  Tobacco Use  . Smoking status: Former Smoker    Years: 12.00    Last attempt to quit: 10/29/1976    Years since quitting: 41.0  . Smokeless tobacco: Never Used  Substance and Sexual Activity  . Alcohol use: Yes    Comment: Occ  . Drug use: No  . Sexual activity: None  Other Topics Concern  . None  Social History Narrative  . None     Family History: The patient's family history includes Diabetes in her father and mother; Heart disease in her father; Hypertension in her father; Suicidality (age of onset: 37) in her mother.  ROS:   Please see the history of present illness.    ROS  All other systems reviewed and negative.   EKGs/Labs/Other Studies Reviewed:    The following studies were reviewed today: none  EKG:  EKG is not ordered today.    Recent Labs: 03/20/2017: ALT 18 04/30/2017: BUN 11; Creatinine, Ser 0.85; Hemoglobin 15.4; NT-Pro BNP 182; Platelets 259; Potassium 4.8; Sodium 137; TSH 2.840   Recent Lipid Panel No results found for: CHOL, TRIG, HDL, CHOLHDL, VLDL, LDLCALC, LDLDIRECT  Physical Exam:    VS:  BP 110/62   Pulse (!) 57   Ht 5' 3.5" (1.613 m)   Wt 189 lb 8 oz (86 kg)   SpO2 97%   BMI 33.04 kg/m     Wt Readings from Last 3 Encounters:  11/01/17 189 lb 8 oz (86 kg)  05/06/17 190 lb (86.2 kg)  05/03/17 189 lb 6.4 oz (85.9 kg)     GEN:  Well nourished, well developed in no acute distress HEENT: Normal NECK: No JVD; No carotid bruits LYMPHATICS: No lymphadenopathy CARDIAC: RRR, no murmurs, rubs, gallops RESPIRATORY:  Clear to auscultation without rales, wheezing or rhonchi  ABDOMEN: Soft, non-tender, non-distended MUSCULOSKELETAL:  No edema; No deformity  SKIN: Warm and dry NEUROLOGIC:  Alert  and oriented x 3 PSYCHIATRIC:  Normal affect   ASSESSMENT:    1. Nonrheumatic aortic valve stenosis   2. Mitral valve disorder   3. Benign essential HTN   4. Pulmonary HTN ([place])   5. DOE (dyspnea on exertion)    PLAN:    In order of problems listed above:  1.  Nonrheumatic AS/AR s/p bioprosthetic AVR.  Stable AVR on echo 03/2017 with mean AVG 87mmHg.  2.  Mild to moderate MR - noted on echo 03/2017 - will repeat echo 03/2018  3.  HTN - BP well controlled on exam today.  She will continue on Losartan 100mg  daily, Toprol XL 25mg  daily.  4.  Pulmonary HTN - mild to moderate with PASP 19mmhg.  - Repeat echo 03/2018 to make sure this remains stable.  She takes Lasix PRN.    5.  SOB - she continues to have DOE with minimal exertion without a clear cut etiology.  Workup from pulmonary for abnormal PFTs - felt secondary to deconditioning and diastolic dysfunction.  She had a cardiopulmonary stress test done showing normal functional capacity with no circulatory limitation.  Peak exercise limited by body habitus and further cardiovascular deconditioning. She had normal coronary arteries on cath.  Pro-BNP was normal last Spring.  She never participated in Cardiac rehab after her heart surgery.  Given that she does have residual pulmonary HTN and persistent with DOE that at times significantly limits her breathing, I have recommended that she had a right heart cath done to assess PA pressure.  If right heart cat his normal then plan to enrol her in cardiac rehab.  Cardiac catheterization was discussed with the patient fully. The patient understands that risks include but are not limited to stroke (1 in 1000), death (1 in 71), kidney failure [usually temporary] (1 in 500), bleeding (1 in 200), allergic reaction [possibly serious] (1 in 200).  The patient understands and is willing to proceed.      Medication Adjustments/Labs and Tests Ordered: Current medicines are reviewed at length with the  patient today.  Concerns regarding medicines are outlined above.  No orders of the defined types were placed in this encounter.  No orders of the defined types were placed in this encounter.   Signed, Fransico Him, MD  11/01/2017 2:57 PM    [place]

## 2017-11-01 NOTE — Telephone Encounter (Signed)
Thanks, we will arrange the [place].

## 2017-11-01 NOTE — H&P (View-Only) (Signed)
Cardiology Office Note:    Date:  11/01/2017   ID:  [name], DOB 1946-11-27, MRN [id number]  PCP:  Seward Kayla, MD  Cardiologist:  Fransico Him, MD   Referring MD: Seward Meiling, MD   Chief Complaint  Patient presents with  . Follow-up    AS/AR/MR    History of Present Illness:    Maureen Keller is a 71 y.o. 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  She is here today for followup and is doing well.  She denies any chest pain or pressure,  PND, orthopnea, LE edema, dizziness, palpitations or syncope. She continues to have problems with SOB when she walks during the day. She had PFTs 2 years ago which showed reduced DLCO and she was referred to Pulmonary.  She had a normal VQ scan. It was felt that her SOB was due to deconditioning and diastolic dysfunction.   She is compliant with her meds and is tolerating meds with no SE.      Past Medical History:  Diagnosis Date  . Aortic stenosis 11/2015   Severe AS s/p Bioprosthetic AVR  . Arthritis   . Chronic kidney disease (CKD), stage III (moderate) (HCC)   . Complication of anesthesia    awakened durimg procedure- heart cath 11/2015.  woke up during buninectomy  . Constipation   . Depression    PTSD  . Dyspnea   . GERD (gastroesophageal reflux disease)   . Glaucoma   . Hyperlipidemia   . Hypertension   . Hypothyroidism   . Mitral valve disorder    Mild to moderate MR by echo 03/2017 - no change from prior echo  . Pulmonary hypertension (HCC)    mild with PASP 25mmHg by echo 03/2017    Past Surgical History:  Procedure Laterality Date  . AORTIC VALVE REPLACEMENT (AVR) N/A 12/16/2015   Performed by Melrose Nakayama, MD at [facility]  . BUNIONECTOMY Right   . CARDIAC CATHETERIZATION  11/04/2009   normal coronaries  . CARPAL TUNNEL RELEASE Right 1982  . CARPAL TUNNEL RELEASE  Left   . CATARACT EXTRACTION Bilateral   . COLONOSCOPY    . LEFT TOTAL KNEE ARTHROPLASTY Left 01/29/2017   Performed by Melrose Nakayama, MD at [place]  . Right/Left Heart Cath and Coronary Angiography N/A 11/30/2015   Performed by Burnell Blanks, MD at [facility]  . TOE SURGERY Right    2 nd toe  . TOTAL RIGHT KNEE ARTHROPLASTY Right 10/09/2016   Performed by Melrose Nakayama, MD at [facility]  . TRANSESOPHAGEAL ECHOCARDIOGRAM (TEE) N/A 12/16/2015   Performed by Melrose Nakayama, MD at [facility]  . TRIGGER FINGER RELEASE Bilateral    thumbs - 2 separtate surgeries    Current Medications: Current Meds  Medication Sig  . aspirin EC 81 MG tablet Take 81 mg by mouth daily.  . Cholecalciferol (VITAMIN D-3) 5000 units TABS Take 5,000 Units by mouth daily.  Marland Kitchen docusate sodium (COLACE) 100 MG capsule Take 300 mg by mouth daily.   . furosemide (LASIX) 20 MG tablet Take 1 tablet (20 mg total) by mouth daily as needed. Take daily as needed for shortness of breath (Patient taking differently: Take 20 mg by mouth daily as needed (shortness of breath/fluid retention.). )  . lamoTRIgine (LAMICTAL) 100 MG tablet Take 100-200 mg by  mouth 2 (two) times daily. 200mg  in the morning and 100mg  in the evening  . latanoprost (XALATAN) 0.005 % ophthalmic solution Place 1 drop into both eyes at bedtime.  Marland Kitchen levothyroxine (SYNTHROID, LEVOTHROID) 137 MCG tablet Take 137 mcg by mouth daily before breakfast.  . losartan (COZAAR) 100 MG tablet TAKE 1 TABLET DAILY.  . metoprolol succinate (TOPROL-XL) 25 MG 24 hr tablet TAKE 1 TABLET ONCE DAILY.  Marland Kitchen omeprazole (PRILOSEC) 20 MG capsule Take 20 mg by mouth at bedtime.   . traZODone (DESYREL) 100 MG tablet Take 300 mg by mouth at bedtime.      Allergies:   Patient has no known allergies.   Social History   Socioeconomic History  . Marital status: Single    Spouse name: None  . Number of children: None  . Years of education: None  . Highest education  level: None  Social Needs  . Financial resource strain: None  . Food insecurity - worry: None  . Food insecurity - inability: None  . Transportation needs - medical: None  . Transportation needs - non-medical: None  Occupational History  . None  Tobacco Use  . Smoking status: Former Smoker    Years: 12.00    Last attempt to quit: 10/29/1976    Years since quitting: 41.0  . Smokeless tobacco: Never Used  Substance and Sexual Activity  . Alcohol use: Yes    Comment: Occ  . Drug use: No  . Sexual activity: None  Other Topics Concern  . None  Social History Narrative  . None     Family History: The patient's family history includes Diabetes in her father and mother; Heart disease in her father; Hypertension in her father; Suicidality (age of onset: 13) in her mother.  ROS:   Please see the history of present illness.    ROS  All other systems reviewed and negative.   EKGs/Labs/Other Studies Reviewed:    The following studies were reviewed today: none  EKG:  EKG is not ordered today.    Recent Labs: 03/20/2017: ALT 18 04/30/2017: BUN 11; Creatinine, Ser 0.85; Hemoglobin 15.4; NT-Pro BNP 182; Platelets 259; Potassium 4.8; Sodium 137; TSH 2.840   Recent Lipid Panel No results found for: CHOL, TRIG, HDL, CHOLHDL, VLDL, LDLCALC, LDLDIRECT  Physical Exam:    VS:  BP 110/62   Pulse (!) 57   Ht 5' 3.5" (1.613 m)   Wt 189 lb 8 oz (86 kg)   SpO2 97%   BMI 33.04 kg/m     Wt Readings from Last 3 Encounters:  11/01/17 189 lb 8 oz (86 kg)  05/06/17 190 lb (86.2 kg)  05/03/17 189 lb 6.4 oz (85.9 kg)     GEN:  Well nourished, well developed in no acute distress HEENT: Normal NECK: No JVD; No carotid bruits LYMPHATICS: No lymphadenopathy CARDIAC: RRR, no murmurs, rubs, gallops RESPIRATORY:  Clear to auscultation without rales, wheezing or rhonchi  ABDOMEN: Soft, non-tender, non-distended MUSCULOSKELETAL:  No edema; No deformity  SKIN: Warm and dry NEUROLOGIC:  Alert  and oriented x 3 PSYCHIATRIC:  Normal affect   ASSESSMENT:    1. Nonrheumatic aortic valve stenosis   2. Mitral valve disorder   3. Benign essential HTN   4. Pulmonary HTN ([place])   5. DOE (dyspnea on exertion)    PLAN:    In order of problems listed above:  1.  Nonrheumatic AS/AR s/p bioprosthetic AVR.  Stable AVR on echo 03/2017 with mean AVG 28mmHg.  2.  Mild to moderate MR - noted on echo 03/2017 - will repeat echo 03/2018  3.  HTN - BP well controlled on exam today.  She will continue on Losartan 100mg  daily, Toprol XL 25mg  daily.  4.  Pulmonary HTN - mild to moderate with PASP 7mmhg.  - Repeat echo 03/2018 to make sure this remains stable.  She takes Lasix PRN.    5.  SOB - she continues to have DOE with minimal exertion without a clear cut etiology.  Workup from pulmonary for abnormal PFTs - felt secondary to deconditioning and diastolic dysfunction.  She had a cardiopulmonary stress test done showing normal functional capacity with no circulatory limitation.  Peak exercise limited by body habitus and further cardiovascular deconditioning. She had normal coronary arteries on cath.  Pro-BNP was normal last Spring.  She never participated in Cardiac rehab after her heart surgery.  Given that she does have residual pulmonary HTN and persistent with DOE that at times significantly limits her breathing, I have recommended that she had a right heart cath done to assess PA pressure.  If right heart cat his normal then plan to enrol her in cardiac rehab.  Cardiac catheterization was discussed with the patient fully. The patient understands that risks include but are not limited to stroke (1 in 1000), death (1 in 31), kidney failure [usually temporary] (1 in 500), bleeding (1 in 200), allergic reaction [possibly serious] (1 in 200).  The patient understands and is willing to proceed.      Medication Adjustments/Labs and Tests Ordered: Current medicines are reviewed at length with the  patient today.  Concerns regarding medicines are outlined above.  No orders of the defined types were placed in this encounter.  No orders of the defined types were placed in this encounter.   Signed, Fransico Him, MD  11/01/2017 2:57 PM    [place]

## 2017-11-02 LAB — CBC WITH DIFFERENTIAL/PLATELET
BASOS: 1 %
Basophils Absolute: 0 10*3/uL (ref 0.0–0.2)
EOS (ABSOLUTE): 0.2 10*3/uL (ref 0.0–0.4)
EOS: 3 %
HEMATOCRIT: 41.9 % (ref 34.0–46.6)
Hemoglobin: 14.4 g/dL (ref 11.1–15.9)
IMMATURE GRANULOCYTES: 0 %
Immature Grans (Abs): 0 10*3/uL (ref 0.0–0.1)
LYMPHS ABS: 2.1 10*3/uL (ref 0.7–3.1)
Lymphs: 36 %
MCH: 31.6 pg (ref 26.6–33.0)
MCHC: 34.4 g/dL (ref 31.5–35.7)
MCV: 92 fL (ref 79–97)
MONOS ABS: 0.6 10*3/uL (ref 0.1–0.9)
Monocytes: 11 %
Neutrophils Absolute: 2.8 10*3/uL (ref 1.4–7.0)
Neutrophils: 49 %
Platelets: 216 10*3/uL (ref 150–379)
RBC: 4.55 x10E6/uL (ref 3.77–5.28)
RDW: 13.6 % (ref 12.3–15.4)
WBC: 5.8 10*3/uL (ref 3.4–10.8)

## 2017-11-02 LAB — BASIC METABOLIC PANEL
BUN / CREAT RATIO: 14 (ref 12–28)
BUN: 15 mg/dL (ref 8–27)
CALCIUM: 9.9 mg/dL (ref 8.7–10.3)
CO2: 25 mmol/L (ref 20–29)
CREATININE: 1.05 mg/dL — AB (ref 0.57–1.00)
Chloride: 102 mmol/L (ref 96–106)
GFR calc Af Amer: 62 mL/min/{1.73_m2} (ref >59)
GFR calc non Af Amer: 54 mL/min/{1.73_m2} — ABNORMAL LOW (ref >59)
GLUCOSE: 112 mg/dL — AB (ref 65–99)
Potassium: 4.5 mmol/L (ref 3.5–5.2)
SODIUM: 140 mmol/L (ref 134–144)

## 2017-11-02 LAB — PROTIME-INR
INR: 1 (ref 0.8–1.2)
Prothrombin Time: 10.6 s (ref 9.1–12.0)

## 2017-11-04 DIAGNOSIS — I119 Hypertensive heart disease without heart failure: Secondary | ICD-10-CM | POA: Diagnosis not present

## 2017-11-04 DIAGNOSIS — I10 Essential (primary) hypertension: Secondary | ICD-10-CM | POA: Diagnosis not present

## 2017-11-04 DIAGNOSIS — E039 Hypothyroidism, unspecified: Secondary | ICD-10-CM | POA: Diagnosis not present

## 2017-11-04 DIAGNOSIS — E785 Hyperlipidemia, unspecified: Secondary | ICD-10-CM | POA: Diagnosis not present

## 2017-11-04 DIAGNOSIS — H409 Unspecified glaucoma: Secondary | ICD-10-CM | POA: Diagnosis not present

## 2017-11-06 ENCOUNTER — Other Ambulatory Visit (HOSPITAL_COMMUNITY): Payer: Self-pay

## 2017-11-06 ENCOUNTER — Encounter (HOSPITAL_COMMUNITY): Payer: Self-pay

## 2017-11-06 DIAGNOSIS — I272 Pulmonary hypertension, unspecified: Secondary | ICD-10-CM

## 2017-11-06 NOTE — Telephone Encounter (Signed)
Maureen Keller will schedule [facility]

## 2017-11-12 ENCOUNTER — Telehealth: Payer: Self-pay

## 2017-11-12 NOTE — Telephone Encounter (Signed)
Was scheduled with pt. Pt aware and has cath directions mailed to her. Thank you. s

## 2017-11-12 NOTE — Telephone Encounter (Signed)
Patient called in stating she has a right heart cath scheduled for tomorrow but never received instructions. She wanted clarification on what meds to take on the day of cath. Informed patient to take all meds and hold lasix and nothing to eat or drink after midnight. Patient verbalized understanding and thanked me.

## 2017-11-13 ENCOUNTER — Other Ambulatory Visit: Payer: Self-pay | Admitting: Cardiology

## 2017-11-13 ENCOUNTER — Ambulatory Visit (HOSPITAL_COMMUNITY)
Admission: RE | Admit: 2017-11-13 | Discharge: 2017-11-13 | Disposition: A | Discharge status: Home / Self Care | Payer: Medicare Other | Source: Ambulatory Visit | Attending: Cardiology | Admitting: Cardiology

## 2017-11-13 ENCOUNTER — Encounter (HOSPITAL_COMMUNITY)
Admission: RE | Disposition: A | Discharge status: Home / Self Care | Payer: Self-pay | Source: Ambulatory Visit | Attending: Cardiology

## 2017-11-13 ENCOUNTER — Encounter (HOSPITAL_COMMUNITY): Payer: Self-pay | Admitting: Cardiology

## 2017-11-13 DIAGNOSIS — E039 Hypothyroidism, unspecified: Secondary | ICD-10-CM | POA: Diagnosis not present

## 2017-11-13 DIAGNOSIS — Z7982 Long term (current) use of aspirin: Secondary | ICD-10-CM | POA: Diagnosis not present

## 2017-11-13 DIAGNOSIS — I130 Hypertensive heart and chronic kidney disease with heart failure and stage 1 through stage 4 chronic kidney disease, or unspecified chronic kidney disease: Secondary | ICD-10-CM | POA: Insufficient documentation

## 2017-11-13 DIAGNOSIS — E785 Hyperlipidemia, unspecified: Secondary | ICD-10-CM | POA: Insufficient documentation

## 2017-11-13 DIAGNOSIS — N183 Chronic kidney disease, stage 3 (moderate): Secondary | ICD-10-CM | POA: Insufficient documentation

## 2017-11-13 DIAGNOSIS — Z87891 Personal history of nicotine dependence: Secondary | ICD-10-CM | POA: Diagnosis not present

## 2017-11-13 DIAGNOSIS — F329 Major depressive disorder, single episode, unspecified: Secondary | ICD-10-CM | POA: Diagnosis not present

## 2017-11-13 DIAGNOSIS — F4310 Post-traumatic stress disorder, unspecified: Secondary | ICD-10-CM | POA: Insufficient documentation

## 2017-11-13 DIAGNOSIS — Z953 Presence of xenogenic heart valve: Secondary | ICD-10-CM | POA: Insufficient documentation

## 2017-11-13 DIAGNOSIS — I509 Heart failure, unspecified: Secondary | ICD-10-CM | POA: Insufficient documentation

## 2017-11-13 DIAGNOSIS — K219 Gastro-esophageal reflux disease without esophagitis: Secondary | ICD-10-CM | POA: Diagnosis not present

## 2017-11-13 DIAGNOSIS — H409 Unspecified glaucoma: Secondary | ICD-10-CM | POA: Insufficient documentation

## 2017-11-13 DIAGNOSIS — M1990 Unspecified osteoarthritis, unspecified site: Secondary | ICD-10-CM | POA: Diagnosis not present

## 2017-11-13 DIAGNOSIS — I272 Pulmonary hypertension, unspecified: Secondary | ICD-10-CM

## 2017-11-13 HISTORY — PX: RIGHT HEART CATH: CATH118263

## 2017-11-13 LAB — POCT I-STAT 3, ART BLOOD GAS (G3+)
Acid-base deficit: 1 mmol/L (ref 0.0–2.0)
Acid-base deficit: 1 mmol/L (ref 0.0–2.0)
BICARBONATE: 24.1 mmol/L (ref 20.0–28.0)
Bicarbonate: 23.5 mmol/L (ref 20.0–28.0)
O2 SAT: 70 %
O2 Saturation: 72 %
PCO2 ART: 38.4 mmHg (ref 32.0–48.0)
PCO2 ART: 39.4 mmHg (ref 32.0–48.0)
PO2 ART: 37 mmHg — AB (ref 83.0–108.0)
PO2 ART: 38 mmHg — AB (ref 83.0–108.0)
TCO2: 25 mmol/L (ref 22–32)
TCO2: 25 mmol/L (ref 22–32)
pH, Arterial: 7.394 (ref 7.350–7.450)
pH, Arterial: 7.395 (ref 7.350–7.450)

## 2017-11-13 SURGERY — RIGHT HEART CATH
Anesthesia: LOCAL

## 2017-11-13 MED ORDER — ONDANSETRON HCL 4 MG/2ML IJ SOLN
4.0000 mg | Freq: Four times a day (QID) | INTRAMUSCULAR | Status: DC | PRN
Start: 2017-11-13 — End: 2017-11-13

## 2017-11-13 MED ORDER — LIDOCAINE HCL (PF) 1 % IJ SOLN
INTRAMUSCULAR | Status: AC
Start: 2017-11-13 — End: ?
  Filled 2017-11-13: qty 30

## 2017-11-13 MED ORDER — ACETAMINOPHEN 325 MG PO TABS: 650.0000 mg | ORAL_TABLET | ORAL | Status: DC | PRN

## 2017-11-13 MED ORDER — MIDAZOLAM HCL 2 MG/2ML IJ SOLN
INTRAMUSCULAR | Status: AC
  Filled 2017-11-13: qty 2

## 2017-11-13 MED ORDER — POTASSIUM CHLORIDE ER 20 MEQ PO TBCR: 20.0000 meq | EXTENDED_RELEASE_TABLET | Freq: Every day | ORAL | 6 refills | Status: AC

## 2017-11-13 MED ORDER — SODIUM CHLORIDE 0.9 % IV SOLN: 250.0000 mL | INTRAVENOUS | Status: DC | PRN

## 2017-11-13 MED ORDER — HEPARIN (PORCINE) IN NACL 2-0.9 UNIT/ML-% IJ SOLN
INTRAMUSCULAR | Status: AC
  Filled 2017-11-13: qty 500

## 2017-11-13 MED ORDER — SODIUM CHLORIDE 0.9% FLUSH: 3.0000 mL | Freq: Two times a day (BID) | INTRAVENOUS | Status: DC

## 2017-11-13 MED ORDER — MIDAZOLAM HCL 2 MG/2ML IJ SOLN
INTRAMUSCULAR | Status: DC | PRN
  Administered 2017-11-13: 0.5 mg via INTRAVENOUS

## 2017-11-13 MED ORDER — ASPIRIN 81 MG PO CHEW: 81.0000 mg | CHEWABLE_TABLET | ORAL | Status: DC

## 2017-11-13 MED ORDER — SODIUM CHLORIDE 0.9 % IV SOLN
INTRAVENOUS | Status: DC
  Administered 2017-11-13: 07:00:00 via INTRAVENOUS

## 2017-11-13 MED ORDER — FUROSEMIDE 20 MG PO TABS: 20.0000 mg | ORAL_TABLET | Freq: Every day | ORAL | 6 refills | Status: AC

## 2017-11-13 MED ORDER — SODIUM CHLORIDE 0.9% FLUSH: 3.0000 mL | INTRAVENOUS | Status: DC | PRN

## 2017-11-13 MED ORDER — LIDOCAINE HCL (PF) 1 % IJ SOLN
INTRAMUSCULAR | Status: DC | PRN
  Administered 2017-11-13: 2 mL via INTRADERMAL

## 2017-11-13 MED ORDER — HEPARIN (PORCINE) IN NACL 2-0.9 UNIT/ML-% IJ SOLN
INTRAMUSCULAR | Status: AC | PRN
  Administered 2017-11-13: 500 mL

## 2017-11-13 SURGICAL SUPPLY — 8 items
CATH BALLN WEDGE 5F 110CM (CATHETERS) ×1 IMPLANT
PACK CARDIAC CATHETERIZATION (CUSTOM PROCEDURE TRAY) ×2 IMPLANT
PROTECTION STATION PRESSURIZED (MISCELLANEOUS) ×2
SHEATH GLIDE SLENDER 4/5FR (SHEATH) ×1 IMPLANT
STATION PROTECTION PRESSURIZED (MISCELLANEOUS) IMPLANT
TRANSDUCER W/STOPCOCK (MISCELLANEOUS) ×2 IMPLANT
TUBING ART PRESS 72  MALE/FEM (TUBING) ×1
TUBING ART PRESS 72 MALE/FEM (TUBING) IMPLANT

## 2017-11-13 NOTE — Progress Notes (Signed)
Site area: right ac vein Site Prior to Removal:  Level 0 Pressure Applied For: 15 min Manual: yes   Patient Status During Pull:  stable Post Pull Site:  Level 0 Post Pull Instructions Given: yes Post Pull Pulses Present: yes Dressing Applied:  yes Bedrest begins @ na Comments:

## 2017-11-13 NOTE — Discharge Instructions (Signed)
Moderate Conscious Sedation, Adult, Care After °These instructions provide you with information about caring for yourself after your procedure. Your health care provider may also give you more specific instructions. Your treatment has been planned according to current medical practices, but problems sometimes occur. Call your health care provider if you have any problems or questions after your procedure. °What can I expect after the procedure? °After your procedure, it is common: °· To feel sleepy for several hours. °· To feel clumsy and have poor balance for several hours. °· To have poor judgment for several hours. °· To vomit if you eat too soon. ° °Follow these instructions at home: °For at least 24 hours after the procedure: ° °· Do not: °? Participate in activities where you could fall or become injured. °? Drive. °? Use heavy machinery. °? Drink alcohol. °? Take sleeping pills or medicines that cause drowsiness. °? Make important decisions or sign legal documents. °? Take care of children on your own. °· Rest. °Eating and drinking °· Follow the diet recommended by your health care provider. °· If you vomit: °? Drink water, juice, or soup when you can drink without vomiting. °? Make sure you have little or no nausea before eating solid foods. °General instructions °· Have a responsible adult stay with you until you are awake and alert. °· Take over-the-counter and prescription medicines only as told by your health care provider. °· If you smoke, do not smoke without supervision. °· Keep all follow-up visits as told by your health care provider. This is important. °Contact a health care provider if: °· You keep feeling nauseous or you keep vomiting. °· You feel light-headed. °· You develop a rash. °· You have a fever. °Get help right away if: °· You have trouble breathing. °This information is not intended to replace advice given to you by your health care provider. Make sure you discuss any questions you have  with your health care provider. °Document Released: 09/23/2013 Document Revised: 05/07/2016 Document Reviewed: 03/24/2016 °Elsevier Interactive Patient Education © 2018 [employer] °Venogram, Care After °This sheet gives you information about how to care for yourself after your procedure. Your health care provider may also give you more specific instructions. If you have problems or questions, contact your health care provider. °What can I expect after the procedure? °After the procedure, it is common to have: °· Bruising or mild discomfort in the area where the IV was inserted (insertion site). ° °Follow these instructions at home: °Eating and drinking °· Follow instructions from your health care provider about eating or drinking restrictions. °· Drink a lot of fluids for the first several days after the procedure, as directed by your health care provider. This helps to wash (flush) the contrast out of your body. Examples of healthy fluids include water or low-calorie drinks. °General instructions °· Check your IV insertion area every day for signs of infection. Check for: °? Redness, swelling, or pain. °? Fluid or blood. °? Warmth. °? Pus or a bad smell. °· Take over-the-counter and prescription medicines only as told by your health care provider. °· Rest and return to your normal activities as told by your health care provider. Ask your health care provider what activities are safe for you. °· Do not drive for 24 hours if you were given a medicine to help you relax (sedative), or until your health care provider approves. °· Keep all follow-up visits as told by your health care provider. This is important. °Contact a   health care provider if: °· Your skin becomes itchy or you develop a rash or hives. °· You have a fever that does not get better with medicine. °· You feel nauseous. °· You vomit. °· You have redness, swelling, or pain around the insertion site. °· You have fluid or blood coming from the insertion  site. °· Your insertion area feels warm to the touch. °· You have pus or a bad smell coming from the insertion site. °Get help right away if: °· You have difficulty breathing or shortness of breath. °· You develop chest pain. °· You faint. °· You feel very dizzy. °These symptoms may represent a serious problem that is an emergency. Do not wait to see if the symptoms will go away. Get medical help right away. Call your local emergency services (911 in the [place] Do not drive yourself to the hospital. °Summary °· After your procedure, it is common to have bruising or mild discomfort in the area where the IV was inserted. °· You should check your IV insertion area every day for signs of infection. °· Take over-the-counter and prescription medicines only as told by your health care provider. °· You should drink a lot of fluids for the first several days after the procedure to help flush the contrast from your body. °This information is not intended to replace advice given to you by your health care provider. Make sure you discuss any questions you have with your health care provider. °Document Released: 09/23/2013 Document Revised: 10/27/2016 Document Reviewed: 10/27/2016 °Elsevier Interactive Patient Education © 2017 [employer]vier Inc. ° °

## 2017-11-13 NOTE — Interval H&P Note (Signed)
History and Physical Interval Note:  11/13/2017 8:32 AM  Maureen Keller  has presented today for surgery, with the diagnosis of sob, polmunary hypertension  The various methods of treatment have been discussed with the patient and family. After consideration of risks, benefits and other options for treatment, the patient has consented to  Procedure(s): RIGHT HEART CATH (N/A) as a surgical intervention .  The patient's history has been reviewed, patient examined, no change in status, stable for surgery.  I have reviewed the patient's chart and labs.  Questions were answered to the patient's satisfaction.     [employer]

## 2017-12-11 DIAGNOSIS — K219 Gastro-esophageal reflux disease without esophagitis: Secondary | ICD-10-CM | POA: Diagnosis not present

## 2017-12-11 DIAGNOSIS — R05 Cough: Secondary | ICD-10-CM | POA: Diagnosis not present

## 2017-12-31 DIAGNOSIS — F3342 Major depressive disorder, recurrent, in full remission: Secondary | ICD-10-CM | POA: Diagnosis not present

## 2018-01-17 DIAGNOSIS — R4189 Other symptoms and signs involving cognitive functions and awareness: Secondary | ICD-10-CM | POA: Diagnosis not present

## 2018-01-17 DIAGNOSIS — E038 Other specified hypothyroidism: Secondary | ICD-10-CM | POA: Diagnosis not present

## 2018-01-17 DIAGNOSIS — L309 Dermatitis, unspecified: Secondary | ICD-10-CM | POA: Diagnosis not present

## 2018-01-17 DIAGNOSIS — F349 Persistent mood [affective] disorder, unspecified: Secondary | ICD-10-CM | POA: Diagnosis not present

## 2018-01-27 DIAGNOSIS — F349 Persistent mood [affective] disorder, unspecified: Secondary | ICD-10-CM | POA: Diagnosis not present

## 2018-01-27 DIAGNOSIS — R4189 Other symptoms and signs involving cognitive functions and awareness: Secondary | ICD-10-CM | POA: Diagnosis not present

## 2018-01-28 DIAGNOSIS — K219 Gastro-esophageal reflux disease without esophagitis: Secondary | ICD-10-CM | POA: Diagnosis not present

## 2018-01-28 DIAGNOSIS — R05 Cough: Secondary | ICD-10-CM | POA: Diagnosis not present

## 2018-02-10 DIAGNOSIS — R4189 Other symptoms and signs involving cognitive functions and awareness: Secondary | ICD-10-CM | POA: Diagnosis not present

## 2018-02-10 DIAGNOSIS — Z0001 Encounter for general adult medical examination with abnormal findings: Secondary | ICD-10-CM | POA: Diagnosis not present

## 2018-02-10 DIAGNOSIS — R7301 Impaired fasting glucose: Secondary | ICD-10-CM | POA: Diagnosis not present

## 2018-02-10 DIAGNOSIS — E038 Other specified hypothyroidism: Secondary | ICD-10-CM | POA: Diagnosis not present

## 2018-02-10 DIAGNOSIS — I10 Essential (primary) hypertension: Secondary | ICD-10-CM | POA: Diagnosis not present

## 2018-02-10 DIAGNOSIS — S0592XA Unspecified injury of left eye and orbit, initial encounter: Secondary | ICD-10-CM | POA: Diagnosis not present

## 2018-02-10 DIAGNOSIS — R251 Tremor, unspecified: Secondary | ICD-10-CM | POA: Diagnosis not present

## 2018-02-10 DIAGNOSIS — F4320 Adjustment disorder, unspecified: Secondary | ICD-10-CM | POA: Diagnosis not present

## 2018-02-24 DIAGNOSIS — E038 Other specified hypothyroidism: Secondary | ICD-10-CM | POA: Diagnosis not present

## 2018-02-24 DIAGNOSIS — I10 Essential (primary) hypertension: Secondary | ICD-10-CM | POA: Diagnosis not present

## 2018-02-24 DIAGNOSIS — R4189 Other symptoms and signs involving cognitive functions and awareness: Secondary | ICD-10-CM | POA: Diagnosis not present

## 2018-02-24 DIAGNOSIS — F349 Persistent mood [affective] disorder, unspecified: Secondary | ICD-10-CM | POA: Diagnosis not present

## 2018-06-04 DIAGNOSIS — F3341 Major depressive disorder, recurrent, in partial remission: Secondary | ICD-10-CM | POA: Diagnosis not present

## 2018-06-04 DIAGNOSIS — R202 Paresthesia of skin: Secondary | ICD-10-CM | POA: Diagnosis not present

## 2018-06-04 DIAGNOSIS — Z9889 Other specified postprocedural states: Secondary | ICD-10-CM | POA: Diagnosis not present

## 2018-06-04 DIAGNOSIS — Z23 Encounter for immunization: Secondary | ICD-10-CM | POA: Diagnosis not present

## 2018-06-04 DIAGNOSIS — I10 Essential (primary) hypertension: Secondary | ICD-10-CM | POA: Diagnosis not present

## 2018-06-04 DIAGNOSIS — K219 Gastro-esophageal reflux disease without esophagitis: Secondary | ICD-10-CM | POA: Diagnosis not present

## 2018-08-20 DIAGNOSIS — Z1159 Encounter for screening for other viral diseases: Secondary | ICD-10-CM | POA: Diagnosis not present

## 2018-08-20 DIAGNOSIS — L2082 Flexural eczema: Secondary | ICD-10-CM | POA: Diagnosis not present

## 2018-08-20 DIAGNOSIS — Z952 Presence of prosthetic heart valve: Secondary | ICD-10-CM | POA: Diagnosis not present

## 2018-08-20 DIAGNOSIS — E039 Hypothyroidism, unspecified: Secondary | ICD-10-CM | POA: Diagnosis not present

## 2018-08-20 DIAGNOSIS — I10 Essential (primary) hypertension: Secondary | ICD-10-CM | POA: Diagnosis not present

## 2018-08-20 DIAGNOSIS — F321 Major depressive disorder, single episode, moderate: Secondary | ICD-10-CM | POA: Diagnosis not present

## 2018-08-20 DIAGNOSIS — L570 Actinic keratosis: Secondary | ICD-10-CM | POA: Diagnosis not present

## 2018-08-20 DIAGNOSIS — K219 Gastro-esophageal reflux disease without esophagitis: Secondary | ICD-10-CM | POA: Diagnosis not present

## 2018-08-21 DIAGNOSIS — H401132 Primary open-angle glaucoma, bilateral, moderate stage: Secondary | ICD-10-CM | POA: Diagnosis not present

## 2018-08-21 DIAGNOSIS — H524 Presbyopia: Secondary | ICD-10-CM | POA: Diagnosis not present

## 2018-09-16 DIAGNOSIS — H938X1 Other specified disorders of right ear: Secondary | ICD-10-CM | POA: Diagnosis not present

## 2018-09-16 DIAGNOSIS — Z952 Presence of prosthetic heart valve: Secondary | ICD-10-CM | POA: Diagnosis not present

## 2018-09-16 DIAGNOSIS — I10 Essential (primary) hypertension: Secondary | ICD-10-CM | POA: Diagnosis not present

## 2018-09-16 DIAGNOSIS — L309 Dermatitis, unspecified: Secondary | ICD-10-CM | POA: Diagnosis not present

## 2018-09-16 DIAGNOSIS — I351 Nonrheumatic aortic (valve) insufficiency: Secondary | ICD-10-CM | POA: Diagnosis not present

## 2018-09-16 DIAGNOSIS — I5032 Chronic diastolic (congestive) heart failure: Secondary | ICD-10-CM | POA: Diagnosis not present

## 2018-09-16 DIAGNOSIS — R0602 Shortness of breath: Secondary | ICD-10-CM | POA: Diagnosis not present

## 2018-09-16 DIAGNOSIS — Z7982 Long term (current) use of aspirin: Secondary | ICD-10-CM | POA: Diagnosis not present

## 2018-09-16 DIAGNOSIS — I350 Nonrheumatic aortic (valve) stenosis: Secondary | ICD-10-CM | POA: Diagnosis not present

## 2018-09-16 DIAGNOSIS — R001 Bradycardia, unspecified: Secondary | ICD-10-CM | POA: Diagnosis not present

## 2018-09-16 DIAGNOSIS — Z13220 Encounter for screening for lipoid disorders: Secondary | ICD-10-CM | POA: Diagnosis not present

## 2018-09-19 DIAGNOSIS — F321 Major depressive disorder, single episode, moderate: Secondary | ICD-10-CM | POA: Diagnosis not present

## 2018-09-19 DIAGNOSIS — Z13820 Encounter for screening for osteoporosis: Secondary | ICD-10-CM | POA: Diagnosis not present

## 2018-09-19 DIAGNOSIS — Z1239 Encounter for other screening for malignant neoplasm of breast: Secondary | ICD-10-CM | POA: Diagnosis not present

## 2018-09-19 DIAGNOSIS — Z1159 Encounter for screening for other viral diseases: Secondary | ICD-10-CM | POA: Diagnosis not present

## 2018-09-19 DIAGNOSIS — I10 Essential (primary) hypertension: Secondary | ICD-10-CM | POA: Diagnosis not present

## 2018-09-19 DIAGNOSIS — Z23 Encounter for immunization: Secondary | ICD-10-CM | POA: Diagnosis not present

## 2018-09-19 DIAGNOSIS — E039 Hypothyroidism, unspecified: Secondary | ICD-10-CM | POA: Diagnosis not present

## 2018-09-30 DIAGNOSIS — F4323 Adjustment disorder with mixed anxiety and depressed mood: Secondary | ICD-10-CM | POA: Diagnosis not present

## 2018-10-01 DIAGNOSIS — I350 Nonrheumatic aortic (valve) stenosis: Secondary | ICD-10-CM | POA: Diagnosis not present

## 2018-10-01 DIAGNOSIS — Z952 Presence of prosthetic heart valve: Secondary | ICD-10-CM | POA: Diagnosis not present

## 2018-10-01 DIAGNOSIS — R0602 Shortness of breath: Secondary | ICD-10-CM | POA: Diagnosis not present

## 2018-10-02 DIAGNOSIS — H903 Sensorineural hearing loss, bilateral: Secondary | ICD-10-CM | POA: Diagnosis not present

## 2018-10-02 DIAGNOSIS — R05 Cough: Secondary | ICD-10-CM | POA: Diagnosis not present

## 2018-10-02 DIAGNOSIS — H9311 Tinnitus, right ear: Secondary | ICD-10-CM | POA: Diagnosis not present

## 2018-10-02 DIAGNOSIS — K219 Gastro-esophageal reflux disease without esophagitis: Secondary | ICD-10-CM | POA: Diagnosis not present

## 2018-10-07 DIAGNOSIS — F4323 Adjustment disorder with mixed anxiety and depressed mood: Secondary | ICD-10-CM | POA: Diagnosis not present

## 2018-10-24 DIAGNOSIS — R202 Paresthesia of skin: Secondary | ICD-10-CM | POA: Diagnosis not present

## 2018-10-24 DIAGNOSIS — D72810 Lymphocytopenia: Secondary | ICD-10-CM | POA: Diagnosis not present

## 2018-10-24 DIAGNOSIS — R200 Anesthesia of skin: Secondary | ICD-10-CM | POA: Diagnosis not present

## 2018-10-24 DIAGNOSIS — E7800 Pure hypercholesterolemia, unspecified: Secondary | ICD-10-CM | POA: Diagnosis not present

## 2018-10-24 DIAGNOSIS — F4310 Post-traumatic stress disorder, unspecified: Secondary | ICD-10-CM | POA: Diagnosis not present

## 2018-10-29 DIAGNOSIS — R200 Anesthesia of skin: Secondary | ICD-10-CM | POA: Diagnosis not present

## 2018-10-29 DIAGNOSIS — M5030 Other cervical disc degeneration, unspecified cervical region: Secondary | ICD-10-CM | POA: Diagnosis not present

## 2018-10-29 DIAGNOSIS — M5412 Radiculopathy, cervical region: Secondary | ICD-10-CM | POA: Diagnosis not present

## 2018-10-29 DIAGNOSIS — M79642 Pain in left hand: Secondary | ICD-10-CM | POA: Diagnosis not present

## 2018-10-29 DIAGNOSIS — M542 Cervicalgia: Secondary | ICD-10-CM | POA: Diagnosis not present

## 2018-11-10 DIAGNOSIS — H903 Sensorineural hearing loss, bilateral: Secondary | ICD-10-CM | POA: Diagnosis not present

## 2018-11-10 DIAGNOSIS — K219 Gastro-esophageal reflux disease without esophagitis: Secondary | ICD-10-CM | POA: Diagnosis not present

## 2018-11-18 DIAGNOSIS — H401132 Primary open-angle glaucoma, bilateral, moderate stage: Secondary | ICD-10-CM | POA: Diagnosis not present

## 2018-11-19 DIAGNOSIS — R200 Anesthesia of skin: Secondary | ICD-10-CM | POA: Diagnosis not present

## 2018-11-19 DIAGNOSIS — M5030 Other cervical disc degeneration, unspecified cervical region: Secondary | ICD-10-CM | POA: Diagnosis not present

## 2018-11-19 DIAGNOSIS — M5412 Radiculopathy, cervical region: Secondary | ICD-10-CM | POA: Diagnosis not present

## 2018-11-19 DIAGNOSIS — M542 Cervicalgia: Secondary | ICD-10-CM | POA: Diagnosis not present

## 2019-01-07 DIAGNOSIS — F411 Generalized anxiety disorder: Secondary | ICD-10-CM | POA: Diagnosis not present

## 2019-01-08 DIAGNOSIS — F411 Generalized anxiety disorder: Secondary | ICD-10-CM | POA: Diagnosis not present

## 2019-01-14 DIAGNOSIS — F411 Generalized anxiety disorder: Secondary | ICD-10-CM | POA: Diagnosis not present

## 2019-01-15 DIAGNOSIS — F411 Generalized anxiety disorder: Secondary | ICD-10-CM | POA: Diagnosis not present

## 2019-01-19 DIAGNOSIS — F331 Major depressive disorder, recurrent, moderate: Secondary | ICD-10-CM | POA: Diagnosis not present

## 2019-01-21 DIAGNOSIS — F411 Generalized anxiety disorder: Secondary | ICD-10-CM | POA: Diagnosis not present

## 2019-01-22 DIAGNOSIS — F411 Generalized anxiety disorder: Secondary | ICD-10-CM | POA: Diagnosis not present

## 2019-01-28 DIAGNOSIS — F411 Generalized anxiety disorder: Secondary | ICD-10-CM | POA: Diagnosis not present

## 2019-01-29 DIAGNOSIS — F411 Generalized anxiety disorder: Secondary | ICD-10-CM | POA: Diagnosis not present

## 2019-02-02 DIAGNOSIS — F331 Major depressive disorder, recurrent, moderate: Secondary | ICD-10-CM | POA: Diagnosis not present

## 2019-02-04 DIAGNOSIS — F411 Generalized anxiety disorder: Secondary | ICD-10-CM | POA: Diagnosis not present

## 2019-02-05 DIAGNOSIS — F411 Generalized anxiety disorder: Secondary | ICD-10-CM | POA: Diagnosis not present

## 2019-02-11 DIAGNOSIS — F411 Generalized anxiety disorder: Secondary | ICD-10-CM | POA: Diagnosis not present

## 2019-02-12 DIAGNOSIS — F411 Generalized anxiety disorder: Secondary | ICD-10-CM | POA: Diagnosis not present

## 2019-02-16 DIAGNOSIS — F331 Major depressive disorder, recurrent, moderate: Secondary | ICD-10-CM | POA: Diagnosis not present

## 2019-02-16 DIAGNOSIS — F411 Generalized anxiety disorder: Secondary | ICD-10-CM | POA: Diagnosis not present

## 2019-02-19 DIAGNOSIS — F411 Generalized anxiety disorder: Secondary | ICD-10-CM | POA: Diagnosis not present

## 2019-02-23 DIAGNOSIS — F411 Generalized anxiety disorder: Secondary | ICD-10-CM | POA: Diagnosis not present

## 2019-02-26 DIAGNOSIS — F411 Generalized anxiety disorder: Secondary | ICD-10-CM | POA: Diagnosis not present

## 2019-03-04 DIAGNOSIS — F411 Generalized anxiety disorder: Secondary | ICD-10-CM | POA: Diagnosis not present

## 2019-03-12 DIAGNOSIS — F411 Generalized anxiety disorder: Secondary | ICD-10-CM | POA: Diagnosis not present

## 2019-03-17 DIAGNOSIS — F411 Generalized anxiety disorder: Secondary | ICD-10-CM | POA: Diagnosis not present

## 2019-03-30 DIAGNOSIS — F331 Major depressive disorder, recurrent, moderate: Secondary | ICD-10-CM | POA: Diagnosis not present

## 2020-11-04 ENCOUNTER — Telehealth: Payer: Self-pay | Admitting: Cardiology

## 2020-11-04 NOTE — Telephone Encounter (Signed)
Patient has moved to [place] and needs her records sent to her new Cardiologist Dr. Elpidio Anis. The fax # is [phone]. The patient has a release form she will have her new Cardiologist fax a release form to [place]
